# Patient Record
Sex: Female | Born: 1945 | Race: White | Hispanic: No | Marital: Married | Smoking: Never smoker
Health system: Southern US, Community
[De-identification: ages and names within clinical notes are randomized; demographics above are authoritative.]

## PROBLEM LIST (undated history)

## (undated) DIAGNOSIS — M1990 Unspecified osteoarthritis, unspecified site: Secondary | ICD-10-CM

## (undated) DIAGNOSIS — R112 Nausea with vomiting, unspecified: Secondary | ICD-10-CM

## (undated) DIAGNOSIS — C50919 Malignant neoplasm of unspecified site of unspecified female breast: Secondary | ICD-10-CM

## (undated) DIAGNOSIS — C801 Malignant (primary) neoplasm, unspecified: Secondary | ICD-10-CM

## (undated) DIAGNOSIS — R918 Other nonspecific abnormal finding of lung field: Secondary | ICD-10-CM

## (undated) DIAGNOSIS — Z9889 Other specified postprocedural states: Secondary | ICD-10-CM

## (undated) DIAGNOSIS — C541 Malignant neoplasm of endometrium: Secondary | ICD-10-CM

## (undated) DIAGNOSIS — J189 Pneumonia, unspecified organism: Secondary | ICD-10-CM

## (undated) HISTORY — DX: Malignant neoplasm of endometrium: C54.1

## (undated) HISTORY — DX: Malignant neoplasm of unspecified site of unspecified female breast: C50.919

## (undated) HISTORY — PX: ABDOMINAL HYSTERECTOMY: SHX81

---

## 1980-01-06 DIAGNOSIS — R112 Nausea with vomiting, unspecified: Secondary | ICD-10-CM

## 1980-01-06 DIAGNOSIS — Z9889 Other specified postprocedural states: Secondary | ICD-10-CM

## 1980-01-06 HISTORY — DX: Nausea with vomiting, unspecified: Z98.890

## 1980-01-06 HISTORY — DX: Nausea with vomiting, unspecified: R11.2

## 1980-04-05 HISTORY — PX: TUBAL LIGATION: SHX77

## 1980-09-05 DIAGNOSIS — C50919 Malignant neoplasm of unspecified site of unspecified female breast: Secondary | ICD-10-CM

## 1980-09-05 HISTORY — PX: MASTECTOMY: SHX3

## 1980-09-05 HISTORY — DX: Malignant neoplasm of unspecified site of unspecified female breast: C50.919

## 1980-09-11 HISTORY — PX: BREAST BIOPSY: SHX20

## 1997-02-26 ENCOUNTER — Other Ambulatory Visit: Admission: RE | Admit: 1997-02-26 | Discharge: 1997-02-26 | Payer: Self-pay | Admitting: Obstetrics and Gynecology

## 1998-03-04 ENCOUNTER — Other Ambulatory Visit: Admission: RE | Admit: 1998-03-04 | Discharge: 1998-03-04 | Payer: Self-pay | Admitting: Obstetrics and Gynecology

## 1999-03-24 ENCOUNTER — Other Ambulatory Visit: Admission: RE | Admit: 1999-03-24 | Discharge: 1999-03-24 | Payer: Self-pay | Admitting: Obstetrics and Gynecology

## 2000-02-05 ENCOUNTER — Encounter: Admission: RE | Admit: 2000-02-05 | Discharge: 2000-02-05 | Payer: Self-pay | Admitting: Rheumatology

## 2000-03-25 ENCOUNTER — Encounter: Admission: RE | Admit: 2000-03-25 | Discharge: 2000-03-25 | Payer: Self-pay | Admitting: Rheumatology

## 2000-04-12 ENCOUNTER — Other Ambulatory Visit: Admission: RE | Admit: 2000-04-12 | Discharge: 2000-04-12 | Payer: Self-pay | Admitting: Obstetrics and Gynecology

## 2000-05-20 ENCOUNTER — Encounter (HOSPITAL_COMMUNITY): Admission: RE | Admit: 2000-05-20 | Discharge: 2000-06-19 | Payer: Self-pay | Admitting: Rheumatology

## 2000-08-05 ENCOUNTER — Encounter (HOSPITAL_COMMUNITY): Admission: RE | Admit: 2000-08-05 | Discharge: 2000-09-04 | Payer: Self-pay | Admitting: Rheumatology

## 2000-09-30 ENCOUNTER — Encounter (HOSPITAL_COMMUNITY): Admission: RE | Admit: 2000-09-30 | Discharge: 2000-10-30 | Payer: Self-pay | Admitting: Oncology

## 2000-12-23 ENCOUNTER — Encounter (HOSPITAL_COMMUNITY): Admission: RE | Admit: 2000-12-23 | Discharge: 2001-01-22 | Payer: Self-pay | Admitting: Rheumatology

## 2001-03-17 ENCOUNTER — Encounter (HOSPITAL_COMMUNITY): Admission: RE | Admit: 2001-03-17 | Discharge: 2001-04-16 | Payer: Self-pay | Admitting: Rheumatology

## 2001-03-17 ENCOUNTER — Encounter: Payer: Self-pay | Admitting: Rheumatology

## 2001-03-31 ENCOUNTER — Encounter: Payer: Self-pay | Admitting: Rheumatology

## 2001-04-21 ENCOUNTER — Other Ambulatory Visit: Admission: RE | Admit: 2001-04-21 | Discharge: 2001-04-21 | Payer: Self-pay | Admitting: Obstetrics and Gynecology

## 2001-05-12 ENCOUNTER — Encounter (HOSPITAL_COMMUNITY): Admission: RE | Admit: 2001-05-12 | Discharge: 2001-06-11 | Payer: Self-pay | Admitting: Rheumatology

## 2001-07-28 ENCOUNTER — Encounter: Payer: Self-pay | Admitting: Rheumatology

## 2001-07-28 ENCOUNTER — Encounter (HOSPITAL_COMMUNITY): Admission: RE | Admit: 2001-07-28 | Discharge: 2001-08-27 | Payer: Self-pay | Admitting: Oncology

## 2001-09-29 ENCOUNTER — Encounter (HOSPITAL_COMMUNITY): Admission: RE | Admit: 2001-09-29 | Discharge: 2001-10-29 | Payer: Self-pay | Admitting: Rheumatology

## 2001-12-08 ENCOUNTER — Encounter (HOSPITAL_COMMUNITY): Admission: RE | Admit: 2001-12-08 | Discharge: 2002-01-07 | Payer: Self-pay | Admitting: Rheumatology

## 2002-03-02 ENCOUNTER — Encounter (HOSPITAL_COMMUNITY): Admission: RE | Admit: 2002-03-02 | Discharge: 2002-04-01 | Payer: Self-pay | Admitting: Rheumatology

## 2002-04-26 ENCOUNTER — Other Ambulatory Visit: Admission: RE | Admit: 2002-04-26 | Discharge: 2002-04-26 | Payer: Self-pay | Admitting: Obstetrics and Gynecology

## 2002-05-25 ENCOUNTER — Encounter (HOSPITAL_COMMUNITY): Admission: RE | Admit: 2002-05-25 | Discharge: 2002-06-24 | Payer: Self-pay | Admitting: Rheumatology

## 2002-06-29 ENCOUNTER — Encounter (HOSPITAL_COMMUNITY): Admission: RE | Admit: 2002-06-29 | Discharge: 2002-07-29 | Payer: Self-pay | Admitting: Rheumatology

## 2002-08-18 ENCOUNTER — Encounter (HOSPITAL_COMMUNITY): Admission: RE | Admit: 2002-08-18 | Discharge: 2002-09-17 | Payer: Self-pay | Admitting: Rheumatology

## 2002-10-19 ENCOUNTER — Encounter (HOSPITAL_COMMUNITY): Admission: RE | Admit: 2002-10-19 | Discharge: 2002-11-18 | Payer: Self-pay | Admitting: Rheumatology

## 2003-05-29 ENCOUNTER — Other Ambulatory Visit: Admission: RE | Admit: 2003-05-29 | Discharge: 2003-05-29 | Payer: Self-pay | Admitting: Obstetrics and Gynecology

## 2004-01-17 ENCOUNTER — Ambulatory Visit (HOSPITAL_COMMUNITY): Admission: RE | Admit: 2004-01-17 | Discharge: 2004-01-17 | Payer: Self-pay | Admitting: Family Medicine

## 2004-01-22 ENCOUNTER — Ambulatory Visit (HOSPITAL_COMMUNITY): Admission: RE | Admit: 2004-01-22 | Discharge: 2004-01-22 | Payer: Self-pay | Admitting: Family Medicine

## 2004-01-29 ENCOUNTER — Ambulatory Visit (HOSPITAL_COMMUNITY): Admission: RE | Admit: 2004-01-29 | Discharge: 2004-01-29 | Payer: Self-pay | Admitting: Family Medicine

## 2004-07-11 ENCOUNTER — Ambulatory Visit: Payer: Self-pay | Admitting: Cardiology

## 2008-11-07 ENCOUNTER — Encounter: Payer: Self-pay | Admitting: Internal Medicine

## 2008-12-05 ENCOUNTER — Ambulatory Visit (HOSPITAL_COMMUNITY): Admission: RE | Admit: 2008-12-05 | Discharge: 2008-12-05 | Payer: Self-pay | Admitting: Internal Medicine

## 2008-12-05 ENCOUNTER — Ambulatory Visit: Payer: Self-pay | Admitting: Internal Medicine

## 2008-12-13 ENCOUNTER — Encounter: Payer: Self-pay | Admitting: Internal Medicine

## 2008-12-13 ENCOUNTER — Telehealth (INDEPENDENT_AMBULATORY_CARE_PROVIDER_SITE_OTHER): Payer: Self-pay

## 2008-12-14 ENCOUNTER — Encounter: Payer: Self-pay | Admitting: Internal Medicine

## 2010-05-23 NOTE — Consult Note (Signed)
NAME:  Alejandra Callahan, FARFAN NO.:  [id number]   MEDICAL RECORD NO.:  [id number]                  PATIENT TYPE:   LOCATION:                                       FACILITY:   PHYSICIAN:  Aundra Dubin, M.D.            DATE OF BIRTH:   DATE OF CONSULTATION:  06/29/2002  DATE OF DISCHARGE:                                   CONSULTATION   CHIEF COMPLAINT:  Rheumatoid arthritis.   This patient returns reporting that she is doing well.  She occasionally has  some achiness in the PIPs as she did last week. This is now better. She has  had no significant flares lasting 1 or 2 weeks. She has had no URIs, cough,  fever, shortness of breath, nausea, or stomatitis.  Her weight is stable.  She is, overall, feeling well.   She tells me that her most recent Dexascan shows an improvement.  She is now  in an osteopenia category.  She is not having back pain.   MEDICINES:  1. Humira 40 mg about every 3 weeks.  2. Methotrexate 20 mg every week.  3. Caltrate with vitamin D b.i.d.  4. Evista 60 mg daily.  5. Fosamax 70 mg every week.  6. Darvocet rare.  7. Relafen 500 mg daily.   PHYSICAL EXAMINATION:  VITAL SIGNS:  Weight 114 pounds, blood pressure  100/60, respiratory rate 16.  GENERAL:  She appears well.  SKIN:  No ecchymosis, clear.  NECK:  Negative JVD.  No adenopathy.  LUNGS:  Clear.  HEART:  Regular with no murmur.  ABDOMEN:  Negative HSM, nontender.  MUSCULOSKELETAL:  The hands show continued mild synovitis to the PIPs and  MCPs. There is slight bogginess to the second and third right TIPs.  The  wrists have mild chronic swelling, but are nontender.  Elbows, shoulders and  neck have a good range of motion.  Back nontender.  The knees have mild  crepitation but flex, without tenderness to 130 degrees.  The ankles and  feet were nonswollen and nontender.   ASSESSMENT/PLAN:  1. Rheumatoid arthritis.  She remains doing well using the methotrexate and    Humira.  I believe that this is fine that she uses it every 3 weeks. She     is quite stable.  She is going on a trip later this summer and might use     prednisone during this time.  Labs checked on May 25, 2002 show a WBC     5.0, hemoglobin 12.9, platelets 239, AST 34,     albumin 3.7, creatinine 0.5.  We will check labs, again, in mid-August.  2. Osteopenia.  3. She will return in 4 months.  Aundra Dubin, M.D.    WWT/MEDQ  D:  06/29/2002  T:  06/29/2002  Job:  [id number]   cc:   Angus G. Renard Matter, M.D.  [address]  [place]  [place] [address]  Fax: [phone]

## 2010-05-23 NOTE — Consult Note (Signed)
NAME:  Alejandra Callahan, LUKIN NO.:  [id number]   MEDICAL RECORD NO.:  [id number]                   PATIENT TYPE:   LOCATION:                                       FACILITY:   PHYSICIAN:  Aundra Dubin, M.D.            DATE OF BIRTH:   DATE OF CONSULTATION:  03/02/2002  DATE OF DISCHARGE:                                   CONSULTATION   CHIEF COMPLAINT:  Rheumatoid arthritis.   HISTORY OF PRESENT ILLNESS:  Ms. Alejandra Callahan returns reporting that she overall is  doing well.  She will take the Humira about every three weeks and sometimes  every second week.  She still has some injection site reactions which seem  to be lessened but still present.  She has had no URI's, fever, cough,  shortness of breath, stomatitis, rashes or headaches.  Her weight is down  two pounds.  She generally is feeling well and her energy level is good.  She still has mild to moderate achiness in the hands, wrists and feet,  particularly, her stiffness in the morning is about 20 to 30 minutes.   CURRENT MEDICATIONS:  Relafen 500 mg daily, Humira 40 mg q.o. Tuesday,  methotrexate 20 mg q Friday, Caltrate with vitamin D b.i.d., Evista 60 mg  daily, Fosamax 70 mg q week, folic acid 2 mg daily, Darvocet p.r.n.   PHYSICAL EXAMINATION:  GENERAL:  She appears well.  Weight 113 pounds.  VITAL SIGNS:  Blood pressure 100/60, respirations 16.  SKIN:  No bruises.  She has a slight red area from her last Humira injection  at the abdomen.  LUNGS:  Clear.  HEART:  Regular, no murmur.  NECK:  Negative JVD.  MUSCULOSKELETAL:  She continues to have mild to moderate synovitis of the  PIP's, MCP's and wrists.  These areas have mild tenderness, but overall are  much improved from 18 months ago.  They otherwise, have a good range of  motion.  The shoulders are mildly stiff, but are comfortable.  Back  nontender.  Hips, knees, good range of motion.  The knees have crepitation.  Ankles were nontender and  the NTT's had mild tenderness.   ASSESSMENT AND PLAN:  1. Rheumatoid arthritis.  She overall is stable and is doing well.  Had     suggested that she take the Humira on a q.2 week dose and then after the     injection, apply Benadryl cream to the area immediately to hopefully     prevent the ISR.  We will check labs at this time.  Labs from 12/08/01     showed an albumin 3.6, WBC 5.1, Hgb 12.9, CLP     242, creatinine 0.5, AST 26.  Will also repeat labs again in three     months.  2. Osteoporosis.   She will return in four months.  Aundra Dubin, M.D.    WWT/MEDQ  D:  03/02/2002  T:  03/02/2002  Job:  [id number]   cc:   Angus G. Renard Matter, M.D.  [address]  [place]  [place] [address]  Fax: [phone]

## 2010-05-23 NOTE — Consult Note (Signed)
[facility]  Patient:    Alejandra Callahan, Alejandra Callahan Visit Number: [id number] MRN: [id number]          Service Type: RHE Location: [facility] Attending Physician:  Aundra Dubin Dictated by:   Nathaneil Canary, M.D. Proc. Date: 05/12/01 Admit Date:  05/12/2001   CC:         Butch Penny, M.D.   Consultation Report  CHIEF COMPLAINT:  Rheumatoid arthritis.  BRIEF HISTORY:  This is the first office visit for Alejandra Callahan after starting Humira.  She is feeling considerably better.  She has noticed significant decreased swelling and pain in her hands and wrists particularly her feet still ache some.  She has been able to take herself down and is off of prednisone at this time.  There has been no injection site reactions.  There has been no rashes, fever, cough, nausea, stomatitis.  MEDICATIONS: 1. Humira 40 mg q.o. week. 2. Methotrexate 20 mg q. week. 3. Folic acid 2 mg q. day. 4. Relafen 500 mg q. day. 5. Caltrate with Vitamin D b.i.d. 6. Evista 60 mg q. day.  PHYSICAL EXAMINATION:  VITAL SIGNS:  Weight 112 pounds.  Blood pressure 100/70, respirations 16.  GENERAL:  No distress.  SKIN:  Clear.  LUNGS:  Clear.  HEART:  Regular no murmur.  MUSCULOSKELETAL:  There has been a reduction in the swelling to the PIPs and MCPs and a significant reduction in the tenderness.  Wrists, elbows, shoulders, knees and ankles have a good range of motion and they are all nontender.  She still remains moderately tender with compression across the MTPs.  ASSESSMENT: 1. Rheumatoid arthritis.  I believe she is clearly improved on the q.o. week    humira injection and this will be continued. She will continue with the    methotrexate 20 mg q. week and folic acid 2 mg q. day.  She is presently    off of prednisone but may need this occasionally.  We will check labs    today. 2. Osteoporosis issues.  She will continue with the Evista and calcium. 3. Vague chest CT finding.  She  had a CT scan on March 17, 2001, for follow up    of a chest x-ray finding.  This still shows a vague triangular opacity in    the left lung.  We will need to repeat the CT scan in June.  Clinically she    is doing well and this is probably a low suspicion for anything serious.  FOLLOWUP:  She will return in 3 months. Dictated by:   Nathaneil Canary, M.D. Attending Physician:  Aundra Dubin DD:  05/12/01 TD:  05/14/01 Job: [id number]

## 2010-05-23 NOTE — Consult Note (Signed)
[facility]  Patient:    Alejandra Callahan, Alejandra Callahan Visit Number: [id number] MRN: [id number]          Service Type: RHE Location: [facility] Attending Physician:  Aundra Dubin Dictated by:   Nathaneil Canary, M.D. Proc. Date: 07/28/01 Admit Date:  07/28/2001   CC:         Butch Penny, M.D.   Consultation Report  CHIEF COMPLAINT:  Rheumatoid arthritis.  HISTORY OF PRESENT ILLNESS:  The patient has had her followup chest CT because of a vague nodule seen on a chest x-ray in March.  The preliminary report indicates no nodules or masses.  There was a dilated esophagus and a question whether she may have had a distal esophageal stricture.  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  Her weight is stable.  She has been under stress as she has lost her mother in early June.  She feels that the arthritis is doing overall quite well.  She still has chronic swelling to several fingers but is in much less pain.  The stiffness in the morning lasts 15-20 minutes.  She has had no fever, rashes, URIs, cough, shortness of breath, stomatitis.  She has had injection site reactions.  We have discussed over the telephone that she would use ice and Benadryl which does help some.  MEDICINES: 1. Humira 40 mg every other week. 2. Methotrexate 20 mg daily. 3. Folic acid 2 mg daily. 4. Calcium with vitamin D b.i.d. 5. Evista 60 mg daily. 6. Fosamax 70 mg every week. 7. Rare prednisone.  PHYSICAL EXAMINATION:  VITAL SIGNS:  Weight 111 pounds, blood pressure 100/64, respirations 16.  GENERAL:  No distress.  SKIN:  She has some slight macular redness to the lower abdomen where she gives herself an injection.  LUNGS:  Clear.  NECK:  Negative JVD.  HEART:  Regular.  LOWER EXTREMITIES:  No edema.  MUSCULOSKELETAL:  The right third PIP remains swollen and boggy.  She has chronic swelling and mild tenderness to the bilateral MCPs.  The wrists have chronic swelling but are  cool and nontender.  Elbows and shoulders good range of motion.  Back nontender.  Knees are cool and flex easily to 130 degrees. The ankles and feet have mild tenderness.  ASSESSMENT AND PLAN: 1. Rheumatoid arthritis.  She remains stable and is improved since the introduction of the Humira which we will continue.  With the stress she is    under, I believe this is the wrong time to try and lower the methotrexate.    She will continue on 20 mg every week.  Laboratories checked on May 12, 2001    showed a WBC 5.7, HGB 13.3, PLT 247, creatinine 0.9, albumin is 3.9, AST    25.  We will check laboratories today. 2. Resolution of chest nodule. 3. Hepatic cyst per recent CT scan.  I await the final report to see if the    radiologist recommends followup studies or an ultrasound. 4. Esophageal dilatation.  I discussed with the patient these findings.  She    reports that several years ago she had a great deal of difficulty    swallowing with food being caught in the esophagus.  This has been an    unchanged symptom.  At the present time we will not send her for followup    studies as she is stable but if this symptom changed then of course we    would obtain endoscopy.  She will return in 3 months. Dictated by:   Nathaneil Canary, M.D. Attending Physician:  Aundra Dubin DD:  07/28/01 TD:  08/01/01 Job: [id number]

## 2010-05-23 NOTE — Consult Note (Signed)
NAME:  Alejandra Callahan, LAUGHNER NO.:  [id number]   MEDICAL RECORD NO.:  [id number]                   PATIENT TYPE:   LOCATION:                                       FACILITY:   PHYSICIAN:  Aundra Dubin, M.D.            DATE OF BIRTH:   DATE OF CONSULTATION:  12/08/2001  DATE OF DISCHARGE:                                   CONSULTATION   CHIEF COMPLAINT:  Rheumatoid arthritis.   HISTORY OF PRESENT ILLNESS:  The patient is reporting she has had less of  the injection site reactions.  They have not completely stopped but they are  less severe than they were.  She reports that she is having no problems  with her joints and they are not hurting.  I believe she is overall  significantly more comfortable with less arthritic pain.  She is stiff in  the mornings for about 20 minutes.  There have been no URI's, fever, cough,  nausea, stomatitis, headaches, rashes, or reported hair thinning.  Her  weight is stable.   MEDICATIONS:  1. Methotrexate 20 mg weekly.  2. Relafen 500 mg q.d.  3. Humira 40 mg q.o. week.  4. Caltrate with vitamin D b.i.d.  5. Evista 60 mg q.d.  6. Folic acid 2 mg q.d.  7. Fosamax 70 mg weekly.  8. Darvocet rare.   PHYSICAL EXAMINATION:  VITAL SIGNS:  Weight 115 pounds, blood pressure  102/58, respirations 16.  GENERAL:  She appears well.  LUNGS:  Clear.  NECK:  Negative JVD.  HEART:  Regular.  No murmur.  SKIN:  She has a slight injection reaction to the right abdominal area that  is mild.  MUSCULOSKELETAL:  The right third PIP remains chronically swollen with some  tenderness.  She is mildly tender across the MCP's and they are still  slightly swollen.  Wrists have some mild swelling and slight tenderness.  Elbows extend fully and have no nodules.  Shoulders good range of motion.  Back nontender.  Hips, knees, ankles, and feet have a good range of motion  and she has mild to moderate MTP tenderness.    ASSESSMENT AND PLAN:   Rheumatoid arthritis.  She is improved.  Unfortunately, the injection site reactions have reduced in their intensity.  We will check labs at this time.  Labs were stable at the end of September.  She will continue with the arthritis medicines as listed in the medicine  list.   She will return in three months.                                               Aundra Dubin, M.D.    WWT/MEDQ  D:  12/08/2001  T:  12/08/2001  Job:  [id number]  cc:   Angus G. Renard Matter, M.D.  [address]  [place]  [place] [address]  Fax: [phone]

## 2010-05-23 NOTE — Consult Note (Signed)
[facility]  Patient:    Alejandra Callahan, Alejandra Callahan Visit Number: [id number] MRN: [id number]          Service Type: RHE Location: [facility] Attending Physician:  Aundra Dubin Dictated by:   Nathaneil Canary, M.D. Proc. Date: 12/23/00 Admit Date:  12/23/2000   CC:         Butch Penny, M.D.   Consultation Report  CHIEF COMPLAINT:  Rheumatoid arthritis.  BRIEF HISTORY:  Ms. Nishida reports that at the end of October she decreased the prednisone to 5 mg q.o.d.  After about 3 weeks she had worsened and the left third PIP was swollen.  She is aching in both hands.  The Plaquenil so far has not greatly helped.  Her weight is stable.  There have been no URIs, fever, cough, nausea, stomatitis or back pain.  MEDICATIONS: 1. Sulfasalazine 1 gram b.i.d. 2. Folic acid 1 mg q.d. 3. Methotrexate 20 mg q. week. 4. Prednisone 5 mg q.d. 5. Plaquenil 400 mg q.d. 6. Relafen 500 mg q.d. 7. Evista 60 mg q.d. 8. Calcium with Vitamin D b.i.d.  PHYSICAL EXAMINATION:  VITAL SIGNS:  Weight 113 pounds.  Blood pressure 90/70, respirations 20.  GENERAL:  She is in no distress.  SKIN: Clear.  LUNGS:  Clear.  HEART:  Regular.  No murmur.  LOWER EXTREMITIES:  No edema.  MUSCULOSKELETAL:  The bilateral third PIPs are swollen and tender.  The MCPs are moderately swollen and tender.  The wrists have swelling with tenderness. Elbows and shoulders have a good range of motion with mild shoulder stiffness.  The knees, ankles and feet are nonswollen and nontender.  ASSESSMENT AND PLAN: 1. Rheumatoid arthritis. She continues with active arthritis.  We will    continue the medicines as above.  We are giving her an injection of 120 mg    of Depo-Medrol today.  If the triple combination medications has not    worked, if she returns satisfactorily, then we will be making further    changes.  We will check labs today.  FOLLOWUP:  She will return in 3 months or before p.r.n. Dictated by:    Nathaneil Canary, M.D. Attending Physician:  Aundra Dubin DD:  12/23/00 TD:  12/23/00 Job: [id number]

## 2010-05-23 NOTE — Consult Note (Signed)
   NAME:  Alejandra Callahan, Alejandra Callahan NO.:  [id number]   MEDICAL RECORD NO.:  [id number]                   PATIENT TYPE:   LOCATION:                                       FACILITY:   PHYSICIAN:  Aundra Dubin, M.D.            DATE OF BIRTH:   DATE OF CONSULTATION:  10/19/2002  DATE OF DISCHARGE:                                   CONSULTATION   CHIEF COMPLAINT:  Rheumatoid arthritis.   HISTORY OF PRESENT ILLNESS:  [name] reports that she is feeling quite  well.  She has had no bad flares.  She used a small amount of prednisone  while on a trip to [place].  Her weight is stable and she is up four pounds.  There has been no URI, fever, cough, shortness of breath, nausea, stomatitis  or back pain.   MEDICATIONS:  1. Humira about every three weeks.  2. Methotrexate 20 mg q.week.  3. Evista 60 mg daily.  4. Fosamax 70 mg q.week.  5. Relafen 500 mg daily.  6. Calcium with vitamin D b.i.d.   PHYSICAL EXAMINATION:  VITAL SIGNS:  Weight 118 pounds, blood pressure  110/60, respirations 14.  GENERAL APPEARANCE:  No distress.  SKIN:  Clear.  LUNGS:  Clear.  HEART:  Regular, no murmur.  EXTREMITIES:  Lower extremities, no edema.  MUSCULOSKELETAL:  She shows only minor fullness to the MCP's and wrists at  this time, and they are cool and nontender.  Elbows extend fully.  Shoulders  have good range of motion, and there is no tenderness.  Back is nontender.  Knees, ankles and feet are cool, nonswollen and nontender.    ASSESSMENT/PLAN:  Rheumatoid arthritis.  She remains quite stable and will  continue on the medications as above.  Labs checked on August 18, 2002,  showed a WBC of 7.2, HGB 13.6, PLT 248, albumin 4.0, AST 26, creatinine 0.6.  She will have repeat labs in mid November, hopefully, through Dr. Renard Matter'  office.   I will see her back in my [place] office in mid February.      ___________________________________________                       Aundra Dubin, M.D.   WWT/MEDQ  D:  10/19/2002  T:  10/19/2002  Job:  [id number]   cc:   Angus G. Renard Matter, M.D.  [address]  [place]  [place] [address]  Fax: [phone]

## 2010-05-23 NOTE — H&P (Signed)
[facility]  Patient:    Alejandra Callahan, Alejandra Callahan Visit Number: [id number] MRN: [id number]          Service Type: RHE Location: [facility] Attending Physician:  Aundra Dubin Dictated by:   Nathaneil Canary, M.D. Admit Date:  03/17/2001   CC:         Butch Penny, M.D.   History and Physical  CHIEF COMPLAINT:  Rheumatoid arthritis.  BRIEF HISTORY:  Ms. Gatt returns reporting that she has had a rough couple of months.  The injection given in December helped but this wore off and she has been aching in her hands, wrist and feet for 6 to 8weeks.  She has been on the triple antibiotic combination for 6 months now.  She is stiff in the mornings for at least an hour.  Her weight is stable.  There has been no polyuria, polydipsia, fever, cough, nausea, or stomatitis.  MEDICATIONS: 1. Methotrexate 20 mg q. week. 2. Plaquenil 400 mg q. day. 3. Sulfasalazine 1 gram b.i.d. 4. Evista 60 mg q. day. 5. Folic acid 2 mg q. day. 6. Relafen 500 mg q. day. 7. Caltrate with Vitamin D b.i.d. 8. Prednisone 5 mg q. day.  PHYSICAL EXAMINATION:  VITAL SIGNS:  Weight 114 pounds, blood pressure 90/58, respirations 16.  SKIN:  Clear.  LUNGS:  Clear.  HEART:  Regular no murmur.  EXTREMITIES:  Lower extremities no edema.  MUSCULOSKELETAL:  She remains moderately swollen over the PIPs, MCPs and wrist.  These areas are mildly warm but moderately tender.  Elbows extend fully and are nontender.  Shoulders move with stiffness but are nontender. Back:  Nontender.  Hips and knees:  Good range of motion without pain.  Ankles are nontender.  She is moderately tender with compression across the MTPs.  ASSESSMENT AND PLAN: 1. RHEUMATOID ARTHRITIS.  She continues to show significant activity even with    the triple therapy as mentioned above.  I do not believe that it is    adequately working and we will stop the sulfasalazine and the Plaquenil at    this time.  She will continue with  methotrexate 20 mg q. week, and    prednisone 5 mg q. day.  I am starting her on Humira.  This is a 40 mg self administered injection every other week.  This is the newest biological medicine used for rheumatoid arthritis that has recently been approved by the FDA.  I have discussed potential risks and side effects with her.  As a class of biologic agents there seems to be a very minimal, but slight risk, of lymphoma.  This has not been recognized until a report in [facility] in January 2003.  There was a mention of about 30 cases of lymphoma with ______ and Remicade.  I would have to generalize that she will be at slight risk of lymphoma although this is not reported and is not in the PDR insert.  She will be at slight risk of having unusual infections such as TB, fungal, PCP, and others.  Ms. Wahlquist has had a reaction to a PPD in the past and was told never to have this applied again.  We will check a chest x-ray at this time.  I believe the incidence of unusual infections is very low.  There is also a chance of injection site reactions.  She will obtain the medicine and return to undergo some instructions by the nurses on giving self injections.  If this  will work, I believe she will feel considerably better over the next 4 to 6 weeks.  We will check usual labs today and I will see her back in about 2 months. Dictated by:   Nathaneil Canary, M.D. Attending Physician:  Aundra Dubin DD:  03/17/01 TD:  03/18/01 Job: [id number]

## 2010-05-23 NOTE — Consult Note (Signed)
   NAME:  Alejandra Callahan, Alejandra Callahan NO.:  [id number]   MEDICAL RECORD NO.:  [id number]                   PATIENT TYPE:   LOCATION:                                       FACILITY:   PHYSICIAN:  Aundra Dubin, M.D.            DATE OF BIRTH:   DATE OF CONSULTATION:  10/12/2001  DATE OF DISCHARGE:                                   CONSULTATION   CHIEF COMPLAINT:  Rheumatoid arthritis.   HISTORY OF PRESENT ILLNESS:  The patient is returning for an earlier  appointment, primarily out of scheduling changes and mix-ups.  She has had  some mild injection-type reaction the one time that she has taken it since I  had seen her on September 29, 2001.  This is to the stomach area, and she  used a little Benadryl.  She feels the arthritis is stable.  She has been  aching some in her shoulders.   MEDICATIONS:  1. Humira 40 mg every other week.  2. Methotrexate 20 mg every week.  3. Other medicines are unchanged from September 29, 2001.   PHYSICAL EXAMINATION:  VITAL SIGNS:  Weight 115 pounds.  Blood pressure  94/60, respirations 18.  EXTREMITIES:  Her hands and wrists show a slight degree less swelling than  they did two weeks ago.  The skin shows a mild reaction to the right side of  the abdomen.  The shoulders are stiff, but she can abduct the left shoulder  to 130 degrees.  The back was nontender.  The knees, ankles, feet had slight  tenderness.   ASSESSMENT AND PLAN:  Rheumatoid arthritis:  She is here early again because  of a scheduling mix-up.  She is stable, and I have encouraged her to  continue using the Humira.  Laboratories checked on September 29, 2001, were  stable.  Her AST was 26, albumin 3.6, and she had a normal CBC.   She will return in two months.                                               Aundra Dubin, M.D.    WWT/MEDQ  D:  10/13/2001  T:  10/13/2001  Job:  [id number]   cc:   Angus G. Renard Matter, M.D.

## 2010-05-23 NOTE — Consult Note (Signed)
NAME:  Alejandra Callahan, Alejandra Callahan NO.:  [id number]   MEDICAL RECORD NO.:  [id number]                   PATIENT TYPE:   LOCATION:                                       FACILITY:   PHYSICIAN:  Aundra Dubin, M.D.            DATE OF BIRTH:   DATE OF CONSULTATION:  09/29/2001  DATE OF DISCHARGE:                                   CONSULTATION   CHIEF COMPLAINT:  Rheumatoid arthritis.   HISTORY OF PRESENT ILLNESS:  I had spoken with the patient three times by  telephone over the month of August to early September.  She has been having  injection site reactions.  These have occurred to her stomach, which were  mild.  She had a more pronounced reaction with some mild discomfort but more  heat and induration to one of her thighs.  Finally, because of the frequency  of calls and what I perceived to be significant injection site reactions, I  had her stop the medicine on September 08, 2001.  As I had discussed with her  today in a fuller manner, I can tell that these were not over severe.  She  did find that her arthritis was significantly better; however, she has had a  day of hurting in the right shoulder two weeks ago which resolved and her  finger and wrist joints have been starting to swell some.  She is overall  fairly comfortable at this time.  There has been no URI's, fever, cough,  nausea, stomatitis, shortness of breath, or rashes.   MEDICATIONS:  1. Caltrate and vitamin D b.i.d.  2. Tylenol p.r.n.  3. Methotrexate 20 mg weekly.  4. Evista 60 mg q.d.  5. Folic acid 1 mg q.d.  6. Relafen 500 mg q.d.  7. Fosamax 70 mg weekly.   PHYSICAL EXAMINATION:  VITAL SIGNS:  Weight 115 pounds, blood pressure  90/50, respirations 18.  GENERAL:  She appears well.  SKIN:  I can see some slight trace of the reactions to the thighs.  LUNGS:  Clear.  NECK:  Negative JVD.  HEART:  Regular.  No murmur.  EXTREMITIES:  Lower extremities:  No edema.  MUSCULOSKELETAL:   She does have emerging swelling to several PIP's, which  are soft and boggy, especially the right fourth.  The MCP's are swollen and  mild to moderately tender.  The wrists have some mild warmth and tenderness.  Elbows and shoulders:  Good range of motion.  Back:  Nontender.  Hips,  knees, ankles, and feet have a good range of motion and the MTP's are  tender.   ASSESSMENT AND PLAN:  Rheumatoid arthritis.  This is taking another fairly  significant length of time to discuss with her.  Although the injection site  reactions are quite dramatic to her, I believe in the long run that they  will die down and are more of a  mild nature.  There has been some mild  discomfort but she is not having significant pain.  She has found using some  Benadryl cream, along with icing these areas, has helped.  We have also  discussed using possible Enbrel but this is a twice a week injection with  some mixing to be done prior to the injection.  My sense is that over time  these injection site reactions will decrease in their severity.  I am hoping  to go at least two months to give it a further trial.  She is willing to do  this.  If this is not working, then we will give a washout period of time  and try Enbrel.  She will continue with the methotrexate at 20 mg a week.  We will check usual labs for this.   I will see her back in two months.   TIME:  Twenty-five minutes for today's visit, greater than 20 minutes for  other phone calls.                                                Aundra Dubin, M.D.    WWT/MEDQ  D:  09/29/2001  T:  09/30/2001  Job:  [id number]   cc:   Angus G. Renard Matter, M.D.

## 2011-12-10 ENCOUNTER — Encounter (HOSPITAL_COMMUNITY): Payer: Self-pay | Admitting: *Deleted

## 2011-12-10 ENCOUNTER — Ambulatory Visit (HOSPITAL_COMMUNITY)
Admit: 2011-12-10 | Discharge: 2011-12-10 | Disposition: A | Discharge status: Home / Self Care | Payer: Medicare Other | Attending: Internal Medicine | Admitting: Internal Medicine

## 2011-12-10 ENCOUNTER — Emergency Department (HOSPITAL_COMMUNITY)
Admission: EM | Admit: 2011-12-10 | Discharge: 2011-12-10 | Disposition: A | Discharge status: Home / Self Care | Payer: Medicare Other | Attending: Emergency Medicine | Admitting: Emergency Medicine

## 2011-12-10 DIAGNOSIS — M79609 Pain in unspecified limb: Secondary | ICD-10-CM | POA: Insufficient documentation

## 2011-12-10 DIAGNOSIS — M79604 Pain in right leg: Secondary | ICD-10-CM

## 2011-12-10 DIAGNOSIS — Z853 Personal history of malignant neoplasm of breast: Secondary | ICD-10-CM | POA: Insufficient documentation

## 2011-12-10 DIAGNOSIS — Z79899 Other long term (current) drug therapy: Secondary | ICD-10-CM | POA: Insufficient documentation

## 2011-12-10 DIAGNOSIS — Z8739 Personal history of other diseases of the musculoskeletal system and connective tissue: Secondary | ICD-10-CM | POA: Insufficient documentation

## 2011-12-10 DIAGNOSIS — M25559 Pain in unspecified hip: Secondary | ICD-10-CM | POA: Insufficient documentation

## 2011-12-10 DIAGNOSIS — M79651 Pain in right thigh: Secondary | ICD-10-CM

## 2011-12-10 HISTORY — DX: Malignant (primary) neoplasm, unspecified: C80.1

## 2011-12-10 HISTORY — DX: Unspecified osteoarthritis, unspecified site: M19.90

## 2011-12-10 LAB — POCT I-STAT, CHEM 8
BUN: 16 mg/dL (ref 6–23)
Creatinine, Ser: 0.7 mg/dL (ref 0.50–1.10)
Hemoglobin: 12.9 g/dL (ref 12.0–15.0)
Potassium: 3.8 mEq/L (ref 3.5–5.1)
Sodium: 139 mEq/L (ref 135–145)
TCO2: 23 mmol/L (ref 0–100)

## 2011-12-10 MED ORDER — ENOXAPARIN SODIUM 100 MG/ML ~~LOC~~ SOLN
1.0000 mg/kg | Freq: Once | SUBCUTANEOUS | Status: AC
  Administered 2011-12-10: 50 mg via SUBCUTANEOUS
  Filled 2011-12-10: qty 1

## 2011-12-10 MED ORDER — IBUPROFEN 800 MG PO TABS
800.0000 mg | ORAL_TABLET | Freq: Once | ORAL | Status: AC
  Administered 2011-12-10: 800 mg via ORAL
  Filled 2011-12-10: qty 1

## 2011-12-10 NOTE — ED Provider Notes (Signed)
Alejandra Callahan is a 66 y.o. female who is here last night with right medial thigh pain. She was treated with Motrin, which helped her pain. Today, she is ambulatory with minimal discomfort. There is no distinct injury. There are no associated symptoms. Doppler right leg, by radiology, negative for DVT. The patient has appointment with her primary care Dr. Bea Laura to be reevaluated.  Diagnosis- nonspecific right upper leg pain  Plan- Tylenol or Motrin for pain. Follow up with PCP, as planned, and as needed.  Flint Melter, MD 12/10/11 [phone]

## 2011-12-10 NOTE — ED Provider Notes (Signed)
History     CSN: [id number]  Arrival date & time 12/10/11  0153   First MD Initiated Contact with Patient 12/10/11 0246      Chief Complaint  Patient presents with  . Leg Pain    (Consider location/radiation/quality/duration/timing/severity/associated sxs/prior treatment) HPI History provided by patient. Developed right medial thigh pain tonight. She denies any trauma. He had been playing with her grandson does not recall injuring her leg. No swelling or redness. No history of same. No fevers or chills. No shortness of breath or chest pain. Patient is worried she may have a blood clot but has no history of same. No recent surgeries. Has remote history of mastectomy secondary to breast cancer. No known active cancer. Pain is sharp in quality and hurts to move or palpate that area. She took Tylenol earlier but declines any narcotic pain medications but is requesting something for discomfort. Pain is constant. No known alleviating factors. No associated weakness or numbness. Past Medical History  Diagnosis Date  . Cancer     breast  . Arthritis     Past Surgical History  Procedure Date  . Mastectomy 09/1980    History reviewed. No pertinent family history.  History  Substance Use Topics  . Smoking status: Never Smoker   . Smokeless tobacco: Not on file  . Alcohol Use: No    OB History    Grav Para Term Preterm Abortions TAB SAB Ect Mult Living                  Review of Systems  Constitutional: Negative for fever and chills.  HENT: Negative for neck pain and neck stiffness.   Eyes: Negative for pain.  Respiratory: Negative for shortness of breath.   Cardiovascular: Negative for chest pain and leg swelling.  Gastrointestinal: Negative for abdominal pain.  Genitourinary: Negative for dysuria.  Musculoskeletal: Negative for back pain.  Skin: Negative for rash.  Neurological: Negative for headaches.  All other systems reviewed and are negative.    Allergies   Codeine  Home Medications   Current Outpatient Rx  Name  Route  Sig  Dispense  Refill  . ALENDRONATE SODIUM 10 MG PO TABS   Oral   Take 10 mg by mouth daily before breakfast. Take with a full glass of water on an empty stomach.         . OS-CAL 500 + D PO   Oral   Take 1 tablet by mouth daily.         Marland Kitchen VITAMIN D 1000 UNITS PO TABS   Oral   Take 1,000 Units by mouth daily.         Marland Kitchen FOLIC ACID 800 MCG PO TABS   Oral   Take 400 mcg by mouth daily.         Marland Kitchen ONE-DAILY MULTI VITAMINS PO TABS   Oral   Take 1 tablet by mouth daily.           BP 104/46  Pulse 77  Temp 98.2 F (36.8 C) (Oral)  Resp 18  Ht 4\' 11"  (1.499 m)  Wt 109 lb (49.442 kg)  BMI 22.02 kg/m2  SpO2 99%  Physical Exam  Constitutional: She is oriented to person, place, and time. She appears well-developed and well-nourished.  HENT:  Head: Normocephalic and atraumatic.  Eyes: EOM are normal. Pupils are equal, round, and reactive to light.  Neck: Neck supple.  Cardiovascular: Normal rate, regular rhythm and intact distal pulses.  Pulmonary/Chest: Effort normal and breath sounds normal. No respiratory distress.  Abdominal: Soft. Bowel sounds are normal. She exhibits no distension. There is no tenderness.  Musculoskeletal: Normal range of motion. She exhibits no edema.       Right medial thigh with area of tenderness and fullness. No erythema or swelling.  There is an old healing superficial wound in the area the patient states has been there for months and is unchanged. No femoral hernia. Full range of motion at the hip and knee. Distal neurovascular intact. Gait intact.  Neurological: She is alert and oriented to person, place, and time.  Skin: Skin is warm and dry.    ED Course  Procedures (including critical care time)  Results for orders placed during the hospital encounter of 12/10/11  POCT I-STAT, CHEM 8      Component Value Range   Sodium 139  135 - 145 mEq/L   Potassium 3.8  3.5  - 5.1 mEq/L   Chloride 103  96 - 112 mEq/L   BUN 16  6 - 23 mg/dL   Creatinine, Ser 1.61  0.50 - 1.10 mg/dL   Glucose, Bld 096 (*) 70 - 99 mg/dL   Calcium, Ion 0.45  4.09 - 1.30 mmol/L   TCO2 23  0 - 100 mmol/L   Hemoglobin 12.9  12.0 - 15.0 g/dL   HCT 81.1  91.4 - 78.2 %   Pain medication provided. Labs reviewed. lovenox provided and [place] scheduled for this am to further evaluate. Patient declines any narcotic pain medicines. MDM   Right thigh pain. Labs reviewed. Creatinine within normal limits. Lovenox provided. Ultrasound pending. Stable for discharge home and return today for DVT study. Has close followup as needed.         Sunnie Nielsen, MD 12/10/11 [phone]

## 2011-12-10 NOTE — ED Notes (Signed)
Pt c/o pain to upper right leg. Pt states she laid down for a nap at 1300 yesterday and woke at 1500 with pain. Pt states she does not recall injuring her leg but was very active with her grand son most of the day yesterday.

## 2012-01-06 DIAGNOSIS — C541 Malignant neoplasm of endometrium: Secondary | ICD-10-CM

## 2012-01-06 HISTORY — DX: Malignant neoplasm of endometrium: C54.1

## 2012-11-08 ENCOUNTER — Other Ambulatory Visit: Payer: Self-pay | Admitting: Obstetrics and Gynecology

## 2012-11-16 ENCOUNTER — Other Ambulatory Visit: Payer: Self-pay | Admitting: Obstetrics and Gynecology

## 2012-11-17 ENCOUNTER — Other Ambulatory Visit: Payer: Self-pay

## 2012-11-17 ENCOUNTER — Encounter (HOSPITAL_COMMUNITY)
Admission: RE | Admit: 2012-11-17 | Discharge: 2012-11-17 | Disposition: A | Discharge status: Home / Self Care | Payer: Medicare Other | Source: Ambulatory Visit | Attending: Obstetrics and Gynecology | Admitting: Obstetrics and Gynecology

## 2012-11-17 ENCOUNTER — Encounter (HOSPITAL_COMMUNITY): Payer: Self-pay | Admitting: Pharmacist

## 2012-11-17 ENCOUNTER — Encounter (HOSPITAL_COMMUNITY): Payer: Self-pay

## 2012-11-17 HISTORY — DX: Other specified postprocedural states: Z98.890

## 2012-11-17 HISTORY — DX: Nausea with vomiting, unspecified: R11.2

## 2012-11-17 LAB — CBC
Hemoglobin: 12.6 g/dL (ref 12.0–15.0)
MCH: 32.3 pg (ref 26.0–34.0)
MCV: 95.1 fL (ref 78.0–100.0)
Platelets: 242 10*3/uL (ref 150–400)
RBC: 3.9 MIL/uL (ref 3.87–5.11)
WBC: 10 10*3/uL (ref 4.0–10.5)

## 2012-11-17 NOTE — Patient Instructions (Addendum)
Your procedure is scheduled on:11/18/12  Enter through the Main Entrance at :0715 am Pick up desk phone and dial [phone] and inform us of your arrival.  Please call [phone] if you have any problems the morning of surgery.  Remember: Do not eat food or drink liquids, including water, after midnight:tonight  You may brush your teeth the morning of surgery.  DO NOT wear jewelry, eye make-up, lipstick,body lotion, or dark fingernail polish.  (Polished toes are ok) You may wear deodorant.  If you are to be admitted after surgery, leave suitcase in car until your room has been assigned. Patients discharged on the day of surgery will not be allowed to drive home. Wear loose fitting, comfortable clothes for your ride home.

## 2012-11-18 ENCOUNTER — Ambulatory Visit (HOSPITAL_COMMUNITY)
Admission: RE | Admit: 2012-11-18 | Discharge: 2012-11-18 | Disposition: A | Discharge status: Home / Self Care | Payer: Medicare Other | Source: Ambulatory Visit | Attending: Obstetrics and Gynecology | Admitting: Obstetrics and Gynecology

## 2012-11-18 ENCOUNTER — Encounter (HOSPITAL_COMMUNITY): Payer: Medicare Other | Admitting: Anesthesiology

## 2012-11-18 ENCOUNTER — Encounter (HOSPITAL_BASED_OUTPATIENT_CLINIC_OR_DEPARTMENT_OTHER): Admission: RE | Payer: Self-pay | Source: Ambulatory Visit

## 2012-11-18 ENCOUNTER — Ambulatory Visit (HOSPITAL_COMMUNITY): Payer: Medicare Other | Admitting: Anesthesiology

## 2012-11-18 ENCOUNTER — Encounter (HOSPITAL_COMMUNITY)
Admission: RE | Disposition: A | Discharge status: Home / Self Care | Payer: Self-pay | Source: Ambulatory Visit | Attending: Obstetrics and Gynecology

## 2012-11-18 ENCOUNTER — Ambulatory Visit (HOSPITAL_BASED_OUTPATIENT_CLINIC_OR_DEPARTMENT_OTHER)
Admission: RE | Admit: 2012-11-18 | Payer: Medicare Other | Source: Ambulatory Visit | Admitting: Obstetrics and Gynecology

## 2012-11-18 DIAGNOSIS — Z853 Personal history of malignant neoplasm of breast: Secondary | ICD-10-CM | POA: Insufficient documentation

## 2012-11-18 DIAGNOSIS — R9389 Abnormal findings on diagnostic imaging of other specified body structures: Secondary | ICD-10-CM | POA: Insufficient documentation

## 2012-11-18 DIAGNOSIS — IMO0002 Reserved for concepts with insufficient information to code with codable children: Secondary | ICD-10-CM | POA: Insufficient documentation

## 2012-11-18 DIAGNOSIS — N840 Polyp of corpus uteri: Secondary | ICD-10-CM | POA: Insufficient documentation

## 2012-11-18 DIAGNOSIS — N950 Postmenopausal bleeding: Secondary | ICD-10-CM | POA: Insufficient documentation

## 2012-11-18 DIAGNOSIS — N854 Malposition of uterus: Secondary | ICD-10-CM | POA: Insufficient documentation

## 2012-11-18 HISTORY — PX: DILATATION & CURRETTAGE/HYSTEROSCOPY WITH RESECTOCOPE: SHX5572

## 2012-11-18 SURGERY — DILATATION & CURETTAGE/HYSTEROSCOPY WITH RESECTOCOPE
Anesthesia: Choice

## 2012-11-18 SURGERY — DILATATION & CURETTAGE/HYSTEROSCOPY WITH RESECTOCOPE
Anesthesia: General | Wound class: Clean Contaminated

## 2012-11-18 MED ORDER — PROPOFOL 10 MG/ML IV EMUL
INTRAVENOUS | Status: AC
  Filled 2012-11-18: qty 20

## 2012-11-18 MED ORDER — KETOROLAC TROMETHAMINE 30 MG/ML IJ SOLN
INTRAMUSCULAR | Status: DC | PRN
  Administered 2012-11-18: 30 mg via INTRAVENOUS

## 2012-11-18 MED ORDER — FENTANYL CITRATE 0.05 MG/ML IJ SOLN
INTRAMUSCULAR | Status: AC
  Filled 2012-11-18: qty 2

## 2012-11-18 MED ORDER — CHLOROPROCAINE HCL 1 % IJ SOLN
INTRAMUSCULAR | Status: DC | PRN
  Administered 2012-11-18: 20 mL

## 2012-11-18 MED ORDER — MIDAZOLAM HCL 2 MG/2ML IJ SOLN
INTRAMUSCULAR | Status: AC
  Filled 2012-11-18: qty 2

## 2012-11-18 MED ORDER — PROPOFOL 10 MG/ML IV BOLUS
INTRAVENOUS | Status: DC | PRN
  Administered 2012-11-18: 140 mg via INTRAVENOUS

## 2012-11-18 MED ORDER — DEXAMETHASONE SODIUM PHOSPHATE 10 MG/ML IJ SOLN
INTRAMUSCULAR | Status: AC
  Filled 2012-11-18: qty 1

## 2012-11-18 MED ORDER — ONDANSETRON HCL 4 MG/2ML IJ SOLN
INTRAMUSCULAR | Status: DC | PRN
  Administered 2012-11-18: 4 mg via INTRAVENOUS

## 2012-11-18 MED ORDER — LACTATED RINGERS IV SOLN
INTRAVENOUS | Status: DC
  Administered 2012-11-18: 08:00:00 via INTRAVENOUS

## 2012-11-18 MED ORDER — GLYCINE 1.5 % IR SOLN
Status: DC | PRN
  Administered 2012-11-18: 3000 mL

## 2012-11-18 MED ORDER — LIDOCAINE HCL (CARDIAC) 20 MG/ML IV SOLN
INTRAVENOUS | Status: AC
  Filled 2012-11-18: qty 5

## 2012-11-18 MED ORDER — KETOROLAC TROMETHAMINE 30 MG/ML IJ SOLN
INTRAMUSCULAR | Status: AC
  Filled 2012-11-18: qty 1

## 2012-11-18 MED ORDER — DEXAMETHASONE SODIUM PHOSPHATE 10 MG/ML IJ SOLN
INTRAMUSCULAR | Status: DC | PRN
  Administered 2012-11-18: 10 mg via INTRAVENOUS

## 2012-11-18 MED ORDER — FENTANYL CITRATE 0.05 MG/ML IJ SOLN
INTRAMUSCULAR | Status: DC | PRN
  Administered 2012-11-18 (×2): 50 ug via INTRAVENOUS

## 2012-11-18 MED ORDER — CHLOROPROCAINE HCL 1 % IJ SOLN
INTRAMUSCULAR | Status: AC
  Filled 2012-11-18: qty 30

## 2012-11-18 MED ORDER — IBUPROFEN 800 MG PO TABS: 800.0000 mg | ORAL_TABLET | Freq: Three times a day (TID) | ORAL | Status: DC | PRN

## 2012-11-18 MED ORDER — MIDAZOLAM HCL 5 MG/5ML IJ SOLN
INTRAMUSCULAR | Status: DC | PRN
  Administered 2012-11-18: 2 mg via INTRAVENOUS

## 2012-11-18 MED ORDER — ONDANSETRON HCL 4 MG/2ML IJ SOLN
INTRAMUSCULAR | Status: AC
  Filled 2012-11-18: qty 2

## 2012-11-18 MED ORDER — HYDROCORTISONE SOD SUCCINATE 100 MG IJ SOLR
100.0000 mg | Freq: Once | INTRAMUSCULAR | Status: AC
  Administered 2012-11-18: 08:00:00 via INTRAVENOUS
  Filled 2012-11-18: qty 2

## 2012-11-18 SURGICAL SUPPLY — 16 items
CANISTER SUCT 3000ML (MISCELLANEOUS) ×2 IMPLANT
CATH ROBINSON RED A/P 16FR (CATHETERS) ×2 IMPLANT
CLOTH BEACON ORANGE TIMEOUT ST (SAFETY) ×2 IMPLANT
CONTAINER PREFILL 10% NBF 60ML (FORM) ×4 IMPLANT
DRESSING TELFA 8X3 (GAUZE/BANDAGES/DRESSINGS) ×2 IMPLANT
ELECT REM PT RETURN 9FT ADLT (ELECTROSURGICAL) ×2
ELECTRODE REM PT RTRN 9FT ADLT (ELECTROSURGICAL) ×1 IMPLANT
GLOVE BIOGEL PI IND STRL 7.0 (GLOVE) ×2 IMPLANT
GLOVE BIOGEL PI INDICATOR 7.0 (GLOVE) ×2
GLOVE ECLIPSE 6.5 STRL STRAW (GLOVE) ×2 IMPLANT
GOWN STRL REIN XL XLG (GOWN DISPOSABLE) ×4 IMPLANT
LOOP ANGLED CUTTING 22FR (CUTTING LOOP) IMPLANT
PACK HYSTEROSCOPY LF (CUSTOM PROCEDURE TRAY) ×2 IMPLANT
PAD OB MATERNITY 4.3X12.25 (PERSONAL CARE ITEMS) ×2 IMPLANT
TOWEL OR 17X24 6PK STRL BLUE (TOWEL DISPOSABLE) ×4 IMPLANT
WATER STERILE IRR 1000ML POUR (IV SOLUTION) ×2 IMPLANT

## 2012-11-18 NOTE — Anesthesia Preprocedure Evaluation (Signed)
Anesthesia Evaluation  Patient identified by MRN, date of birth, ID band Patient awake    Reviewed: Allergy & Precautions, H&P , Patient's Chart, lab work & pertinent test results, reviewed documented beta blocker date and time   Airway Mallampati: II TM Distance: >3 FB Neck ROM: full    Dental no notable dental hx.    Pulmonary  breath sounds clear to auscultation  Pulmonary exam normal       Cardiovascular Rhythm:regular Rate:Normal     Neuro/Psych    GI/Hepatic   Endo/Other    Renal/GU      Musculoskeletal   Abdominal   Peds  Hematology   Anesthesia Other Findings RA    Stable prednisone dose, FROM of neck Distant HX of PONV Nl EKG  Reproductive/Obstetrics                           Anesthesia Physical Anesthesia Plan  ASA: II  Anesthesia Plan:    Post-op Pain Management:    Induction: Intravenous  Airway Management Planned: LMA  Additional Equipment:   Intra-op Plan:   Post-operative Plan:   Informed Consent: I have reviewed the patients History and Physical, chart, labs and discussed the procedure including the risks, benefits and alternatives for the proposed anesthesia with the patient or authorized representative who has indicated his/her understanding and acceptance.   Dental Advisory Given and Dental advisory given  Plan Discussed with: CRNA and Surgeon  Anesthesia Plan Comments:         Anesthesia Quick Evaluation

## 2012-11-18 NOTE — Transfer of Care (Signed)
Immediate Anesthesia Transfer of Care Note  Patient: Alejandra Callahan  Procedure(s) Performed: Procedure(s): DILATATION & CURETTAGE/HYSTEROSCOPY WITH RESECTOCOPE (N/A)  Patient Location: PACU  Anesthesia Type:General  Level of Consciousness: awake, alert  and oriented  Airway & Oxygen Therapy: Patient Spontanous Breathing and Patient connected to nasal cannula oxygen  Post-op Assessment: Report given to PACU RN and Post -op Vital signs reviewed and stable  Post vital signs: stable  Complications: No apparent anesthesia complications

## 2012-11-18 NOTE — Brief Op Note (Signed)
11/18/2012  9:57 AM  PATIENT:  Alejandra Callahan  67 y.o. female  PRE-OPERATIVE DIAGNOSIS:  Postmenopausal Bleeding, Endometrial Mass/polyp    POST-OPERATIVE DIAGNOSIS:  Postmenopausal Bleeding, Endometrial thickening, suspect endometrial cancer  PROCEDURE:  Diagnostic hysteroscopy, dilation and  currettage  SURGEON:  Surgeon(s) and Role:    * Geneve Kimpel Cathie Beams, MD - Primary  PHYSICIAN ASSISTANT:   ASSISTANTS: none   ANESTHESIA:   general and paracervical block FINDINGS: Diffuse thickening w/ abnormal vessels throughout. ostias not seen EBL:  Total I/O In: 800 [I.V.:800] Out: 160 [Urine:150; Blood:10]  BLOOD ADMINISTERED:none  DRAINS: none   LOCAL MEDICATIONS USED:  OTHER nesicaine  SPECIMEN:  Source of Specimen:  emc  DISPOSITION OF SPECIMEN:  PATHOLOGY  COUNTS:  YES  TOURNIQUET:  * No tourniquets in log *  DICTATION: .Other Dictation: Dictation Number [id number]  PLAN OF CARE: Discharge to home after PACU  PATIENT DISPOSITION:  PACU - hemodynamically stable.   Delay start of Pharmacological VTE agent (>24hrs) due to surgical blood loss or risk of bleeding: no

## 2012-11-18 NOTE — H&P (Signed)
Alejandra Callahan is an 67 y.o. female. G2P2 MWF presents for mgmt of PMB with findings of endometrial mass on sonohysterogram. Hx notable for left breast cancer s/p mastectomy 2) chronic steroid use for RA  Pertinent Gynecological History: Menses: post-menopausal Bleeding: post menopausal bleeding Contraception: none DES exposure: denies Blood transfusions: none Sexually transmitted diseases: no past history Previous GYN Procedures: TL  Last mammogram: normal Date: 05/13/2012 Last pap: normal Date: 11/18/2011 OB History: G2, P2   Menstrual History No LMP recorded. Patient is postmenopausal.    Past Medical History  Diagnosis Date  . Cancer     breast  . Arthritis     Rheumatoid arthritis  . PONV (postoperative nausea and vomiting) 1982    Past Surgical History  Procedure Laterality Date  . Mastectomy  09/1980  . Tubal ligation      No family history on file.  Social History:  reports that she has never smoked. She does not have any smokeless tobacco history on file. She reports that she does not drink alcohol or use illicit drugs.  Allergies:  Allergies  Allergen Reactions  . Codeine Other (See Comments)    blurred vision, dizzy, loss of hearing    No prescriptions prior to admission    Review of Systems  All other systems reviewed and are negative.    There were no vitals taken for this visit. Physical Exam  Constitutional: She is oriented to person, place, and time. She appears well-developed and well-nourished.  HENT:  Head: Atraumatic.  Eyes: EOM are normal.  Neck: Neck supple.  Cardiovascular: Regular rhythm.   Respiratory: Breath sounds normal.  GI: Soft.  Neurological: She is alert and oriented to person, place, and time.  Skin: Skin is warm and dry.  Psychiatric: She has a normal mood and affect.    Results for orders placed during the hospital encounter of 11/17/12 (from the past 24 hour(s))  CBC     Status: None   Collection Time    11/17/12   2:10 PM      Result Value Range   WBC 10.0  4.0 - 10.5 K/uL   RBC 3.90  3.87 - 5.11 MIL/uL   Hemoglobin 12.6  12.0 - 15.0 g/dL   HCT 62.9  52.8 - 41.3 %   MCV 95.1  78.0 - 100.0 fL   MCH 32.3  26.0 - 34.0 pg   MCHC 34.0  30.0 - 36.0 g/dL   RDW 24.4  01.0 - 27.2 %   Platelets 242  150 - 400 K/uL    No results found.  Assessment/Plan: PMB Endometrial mass Chronic steroid use  P) dx hysteroscopy, D&C, resection of endometrial mass. Stress dose of steroid. Risk of surgery reviewed including infection, bleeding, uterine perforation and its mgmt/risk, thermal injury, injury to surrounding organ structures  Dory Demont A 11/18/2012, 5:22 AM

## 2012-11-19 NOTE — Op Note (Signed)
NAME:  Alejandra Callahan, Alejandra Callahan NO.:  [id number]  MEDICAL RECORD NO.:  [id number]  LOCATION:  [facility]                          FACILITY:  [facility]  PHYSICIAN:  Maxie Better, M.D.DATE OF BIRTH:  December 07, 1945  DATE OF PROCEDURE:  11/18/2012 DATE OF DISCHARGE:  11/18/2012                              OPERATIVE REPORT   PREOPERATIVE DIAGNOSES:  Postmenopausal bleeding.  History of left breast cancer.  Endometrial mass/polyp.  PROCEDURE:  Diagnostic hysteroscopy, Dilation and curettage.  POSTOPERATIVE DIAGNOSES:  Postmenopausal bleeding, endometrial thickening, history of left breast cancer.  ANESTHESIA:  General.  SURGEON:  Maxie Better, MD  ASSISTANT:  None.  PROCEDURE:  Under adequate general anesthesia, the patient was placed in the dorsal lithotomy position.  She was sterilely prepped and draped in usual fashion.  Bladder was catheterized for moderate amount of urine. Examination under anesthesia revealed anteverted uterus.  No adnexal masses could be appreciated.  A bivalve speculum was placed in the vagina. 1% Nesicaine used for paracervical block at 3 and 9 o'clock position. Single-tooth tenaculum was placed on the anterior lip of the cervix.  The cervix was then serially dilated up to a #27 Pratt dilator, and a resectoscope with a single loop was inserted into the uterine cavity.  No abnormal uterine cavity architecture was noted.  Frond-like lesions were noted with lots of vascularity and large blood vessels throughout.  The hysteroscope was removed.  The cavity was then gently curetted for moderate amount of tissue.  At that point, the procedure was then terminated by removing all instruments from the vagina.  SPECIMEN:  Endometrial curetting was sent to Pathology.  ESTIMATED BLOOD LOSS:  Minimal.  COMPLICATION:  None.  The patient tolerated the procedure well and was transferred to recovery room in stable condition.     Maxie Better,  M.D.    [place]/MEDQ  D:  11/18/2012  T:  11/19/2012  Job:  [id number]

## 2012-11-21 ENCOUNTER — Encounter (HOSPITAL_COMMUNITY): Payer: Self-pay | Admitting: Obstetrics and Gynecology

## 2012-11-21 NOTE — Anesthesia Postprocedure Evaluation (Signed)
  Anesthesia Post-op Note  Patient: [name]  Procedure(s) Performed: Procedure(s): DILATATION & CURETTAGE/HYSTEROSCOPY WITH RESECTOCOPE (N/A)  Patient is awake and responsive. Pain and nausea are reasonably well controlled. Vital signs are stable and clinically acceptable. Oxygen saturation is clinically acceptable. There are no apparent anesthetic complications at this time. Patient is ready for discharge.

## 2012-12-09 HISTORY — PX: VAGINAL HYSTERECTOMY: SHX2639

## 2013-01-16 ENCOUNTER — Encounter (INDEPENDENT_AMBULATORY_CARE_PROVIDER_SITE_OTHER): Payer: Self-pay

## 2013-01-16 ENCOUNTER — Encounter: Payer: Self-pay | Admitting: Genetic Counselor

## 2013-01-16 ENCOUNTER — Ambulatory Visit (HOSPITAL_BASED_OUTPATIENT_CLINIC_OR_DEPARTMENT_OTHER): Payer: Medicare Other | Admitting: Genetic Counselor

## 2013-01-16 ENCOUNTER — Other Ambulatory Visit: Payer: Medicare Other

## 2013-01-16 DIAGNOSIS — Z800 Family history of malignant neoplasm of digestive organs: Secondary | ICD-10-CM

## 2013-01-16 DIAGNOSIS — Z803 Family history of malignant neoplasm of breast: Secondary | ICD-10-CM

## 2013-01-16 DIAGNOSIS — IMO0002 Reserved for concepts with insufficient information to code with codable children: Secondary | ICD-10-CM

## 2013-01-16 DIAGNOSIS — C55 Malignant neoplasm of uterus, part unspecified: Secondary | ICD-10-CM

## 2013-01-16 DIAGNOSIS — C50912 Malignant neoplasm of unspecified site of left female breast: Secondary | ICD-10-CM

## 2013-01-16 NOTE — Progress Notes (Signed)
Dr.  Alben Deeds requested a consultation for genetic counseling and risk assessment for Alejandra Callahan, a 68 y.o. female, for discussion of her personal history of breast and uterine cancer and family history of breast and colon cancer.  She presents to clinic today to discuss the possibility of a genetic predisposition to cancer, and to further clarify her risks, as well as her family members' risks for cancer.   HISTORY OF PRESENT ILLNESS: In 1982, at the age of 23, Alejandra Callahan was diagnosed with breast cancer of the left breast. This was treated with unilateral mastectomy.  In November 8003, Alejandra Callahan was diagnosed with uterine cancer.  She had a [facility]BSO at [facility] on December 08, 2012. She did not need chemotherapy or radiation with this cancer.  She has had a colonoscopy which found several non-cancerous polyps.  She is due for another colonoscopy this year. In 2002 she had BRCA testing that she paid out of pocket for and was negative for mutations within these genes.  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  They suggested that she be seen for additional genetic testing through a clinical laboratory.   Past Medical History  Diagnosis Date  . Cancer     breast  . Arthritis     Rheumatoid arthritis  . PONV (postoperative nausea and vomiting) 1982  . Breast cancer 09/1980    left unilateral mastectomy, no chemo or radiatioin    Past Surgical History  Procedure Laterality Date  . Mastectomy  09/1980  . Tubal ligation    . Dilatation & currettage/hysteroscopy with resectocope N/A 11/18/2012    Procedure: DILATATION & CURETTAGE/HYSTEROSCOPY WITH RESECTOCOPE;  Surgeon: Marvene Staff, MD;  Location: [facility];  Service: Gynecology;  Laterality: N/A;    History   Social History  . Marital Status: Married    Spouse Name: N/A    Number of Children: 2  . Years of Education: N/A   Social History Main Topics  .  Smoking status: Never Smoker   . Smokeless tobacco: None  . Alcohol Use: No  . Drug Use: No  . Sexual Activity: None   Other Topics Concern  . None   Social History Narrative  . None    REPRODUCTIVE HISTORY AND PERSONAL RISK ASSESSMENT FACTORS: Menarche was at age 68.   postmenopausal Uterus Intact: no Ovaries Intact: no G2P2A0,  She has not previously undergone treatment for infertility.   Oral Contraceptive use: 0 years   She has not used HRT in the past.    FAMILY HISTORY:  We obtained a detailed, 4-generation family history.  Significant diagnoses are listed below: Family History  Problem Relation Age of Onset  . Breast cancer Mother 65  . Heart attack Mother   . Colon cancer Father 62  . Breast cancer Sister 61  . Heart attack Paternal Grandfather   The patient's mother was an only child, and her father had 32 brothers and sisters.  Patient's maternal ancestors are of [place] descent, and paternal ancestors are of [place] descent. There is no reported Ashkenazi Jewish ancestry. There is no known consanguinity.  GENETIC COUNSELING ASSESSMENT: Alejandra Callahan is a 68 y.o. female with a personal history of breast and uterine cancer which somewhat suggestive of a hereditary cancer syndrome and predisposition to cancer. We, therefore, discussed and recommended the following at today's visit.   DISCUSSION: We reviewed the characteristics, features  and inheritance patterns of hereditary cancer syndromes. We also discussed genetic testing, including the appropriate family members to test, the process of testing, insurance coverage and turn-around-time for results. We reviewed hereditary cancer syndromes and discussed that BRCA testing has been updated since she had last had it performed.  We reviewed some of the genes that we are concerned about for both breast and uterine cancer including PTEN and the Lynch genes.  We briefly reviewed BARD1 in that it is a hereditary cancer gene with  an increased risk for breast cancer.  We discussed that if she was found to have a deleterious mutation within this gene we would review her risks further.  PLAN: After considering the risks, benefits, and limitations, Alejandra Callahan provided informed consent to pursue genetic testing and the blood sample will be sent to [facility] for analysis of the Breast/Ovarian cancer panel. We discussed the implications of a positive, negative and/ or variant of uncertain significance genetic test result. Results should be available within approximately 3 weeks' time, at which point they will be disclosed by telephone to Alejandra Callahan, as will any additional recommendations warranted by these results. Alejandra Callahan will receive a summary of her genetic counseling visit and a copy of her results once available. This information will also be available in Epic. We encouraged Alejandra Callahan WELGE to remain in contact with cancer genetics annually so that we can continuously update the family history and inform her of any changes in cancer genetics and testing that may be of benefit for her family. Avyn C Dudding's questions were answered to her satisfaction today. Our contact information was provided should additional questions or concerns arise.  The patient was seen for a total of 60 minutes, greater than 50% of which was spent face-to-face counseling.  This note will also be sent to the referring provider via the electronic medical record. The patient will be supplied with a summary of this genetic counseling discussion as well as educational information on the discussed hereditary cancer syndromes following the conclusion of their visit.   Patient was discussed with Dr. Marcy Panning.   _______________________________________________________________________ For Office Staff:  Number of people involved in session: 1 Was an Intern/ student involved with case: yes

## 2013-01-27 ENCOUNTER — Telehealth: Payer: Self-pay | Admitting: Genetic Counselor

## 2013-01-27 NOTE — Telephone Encounter (Signed)
Could not leave a message.

## 2013-01-31 ENCOUNTER — Telehealth: Payer: Self-pay | Admitting: Genetic Counselor

## 2013-01-31 NOTE — Telephone Encounter (Signed)
Left good news message on genetic test results.  Asked that she call me back.

## 2013-02-01 ENCOUNTER — Encounter: Payer: Self-pay | Admitting: Genetic Counselor

## 2013-02-01 ENCOUNTER — Telehealth: Payer: Self-pay | Admitting: Genetic Counselor

## 2013-02-01 NOTE — Telephone Encounter (Signed)
Revealed negative genetic testing.  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 

## 2013-03-29 ENCOUNTER — Encounter: Payer: Self-pay | Admitting: Genetic Counselor

## 2013-05-18 ENCOUNTER — Encounter: Payer: Self-pay | Admitting: Gynecologic Oncology

## 2013-05-18 ENCOUNTER — Ambulatory Visit: Payer: Medicare Other | Attending: Gynecologic Oncology | Admitting: Gynecologic Oncology

## 2013-05-18 VITALS — BP 109/56 | HR 78 | Temp 97.8°F | Resp 18 | Ht 60.0 in | Wt 112.7 lb

## 2013-05-18 DIAGNOSIS — C549 Malignant neoplasm of corpus uteri, unspecified: Secondary | ICD-10-CM | POA: Insufficient documentation

## 2013-05-18 DIAGNOSIS — Z9010 Acquired absence of unspecified breast and nipple: Secondary | ICD-10-CM | POA: Insufficient documentation

## 2013-05-18 DIAGNOSIS — M069 Rheumatoid arthritis, unspecified: Secondary | ICD-10-CM | POA: Insufficient documentation

## 2013-05-18 DIAGNOSIS — C541 Malignant neoplasm of endometrium: Secondary | ICD-10-CM | POA: Insufficient documentation

## 2013-05-18 DIAGNOSIS — Z79899 Other long term (current) drug therapy: Secondary | ICD-10-CM | POA: Insufficient documentation

## 2013-05-18 DIAGNOSIS — Z853 Personal history of malignant neoplasm of breast: Secondary | ICD-10-CM | POA: Insufficient documentation

## 2013-05-18 NOTE — Patient Instructions (Addendum)
Return to clinic in 4 months to see Dr. Garwin Brothers and call for an appointment with Dr. Alycia Rossetti 4 months after that.

## 2013-05-18 NOTE — Progress Notes (Signed)
Consult Note: Gyn-Onc  [name] 68 y.o. female  CC:  Chief Complaint  Patient presents with  . Endo Ca    Follow up     HPI: HISTORY: MARGARITE VESSEL is a 68 y.o. woman who is seen in consultation at the request of Dr. 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 The patient has a history of left breast cancer in 1982. She was treated with radical mastectomy with lymphadenectomy. She did not undergo chemotherapy, radiation, or hormonal therapy. She is experiencing postmenopausal spotting over the last several months and had a normal endometrial biopsy. An endometrial curettage was performed in the operating room which revealed a grade 2 endometrioid adenocarcinoma with squamous differentiation.   The patient has no current complaints. 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 This gene may have a propensity for increased risk of breast and ovarian cancer. She underwent testing secondary to a strong family history and personal history of early breast cancer. She recently had repeat testing that revealed negative genetic testing. 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  Oncology Summary:    Endometrial cancer    11/28/2012  Initial Diagnosis  PMB --> EMB: Grade 2 EC    12/09/2012  Surgery  RATLH/BSO/PPALND with SLN per [facility] protocol.   Surgical Findings:  Normal appearing uterus, tubes and ovaries. Thin adhesion between colon and left adnexa. Normal appearing pelvic and PA nodes. No evidence of metastatic disease. One sentinel node in left obturator space.  Pathology:  Diagnosis: A: Sentinel lymph node #1, left obturator, excision - Fibrofatty soft tissue, no lymph node identified, no tumor seen   B:  Colon, epiploic nodule, excision  - Calcified fibrous tissue with foreign body giant cell reaction, no tumor seen  C: Lymph nodes, left periaortic, regional node excision  - One lymph node with no evidence of carcinoma (0/1)  D: Lymph nodes, right periaortic, regional node excision  - Two lymph nodes with no evidence of carcinoma (0/2)  E: Lymph nodes, left pelvic, regional excision  - Three lymph node with no evidence of carcinoma (0/3)  F: Lymph nodes, right pelvic, regional node excision  - Four lymph nodes with no evidence of carcinoma (0/4)  G: Uterus, cervix, bilateral tubes and ovaries, hysterectomy and bilateral salpingo-oophorectomy   Histologic type: Endometrioid adenocarcinoma with squamous differentiation (G7)  Histologic grade: FIGO grade 3 (architecture grade 2, nuclear grade 3)  Tumor site: Endometrium  Tumor size: 5.2 cm (gross)  Myometrial invasion: Inner half Depth: 4 mm Wall thickness: 13 mm Percent: 31% (G7)   Serosal involvement: Not identified  Lower uterine segment involvement: Not identified  Cervical involvement: Not identified  Adnexal involvement: Not identified  Other involved sites: N/A  Cervical/vaginal margin and distance: Widely free   Lymphovascular space invasion: Not identified  Right ovary: Atrophic ovary with no specific pathologic abnormality, no tumor seen Left ovary: Atrophic ovary with no specific pathologic abnormality, no tumor seen  Right fallopian tube: No specific pathologic abnormality, no tumor seen Left fallopian tube: No specific pathologic abnormality, no tumor seen   Stage/Disposition: INFANTOF VILLAGOMEZ is a 68 y.o. woman with Stage IA Grade 3 Endometrial Adenocarcinoma. Her disposition is to close follow  up.  Interval History:  She's overall doing quite well. She states that she needed to have some prescriptions refilled and call Dr. cousins office. She went in for an exam which was required prior to her refill  prescription being called in and had a negative Pap smear in April. She's overall doing well. She had a left leg lesion removed it was benign. She has followup with her dermatologist. There are no new medical problems in her family. Her primary physician is Dr. Meade Maw in [place]. Dr. Garwin Brothers arranges her mammograms.  Review of Systems  Constitutional: Denies fever. Skin: No rash Cardiovascular: No chest pain, shortness of breath Pulmonary: No cough or wheeze.  Gastro Intestinal: No nausea, vomiting, constipation, or diarrhea reported. No change in bowel movement.  Genitourinary: No frequency, urgency, or dysuria.  Denies vaginal bleeding and discharge.  Musculoskeletal: No myalgia, arthralgia, joint swelling or pain.  Neurologic: No weakness, numbness, or change in gait.  Psychology: No changes   Current Meds:  Outpatient Encounter Prescriptions as of 05/18/2013  Medication Sig  . alendronate (FOSAMAX) 10 MG tablet Take 10 mg by mouth daily before breakfast. Take with a full glass of water on an empty stomach.  . Calcium Carbonate-Vitamin D (OS-CAL 500 + D PO) Take 1 tablet by mouth daily.  . cholecalciferol (VITAMIN D) 1000 UNITS tablet Take 1,000 Units by mouth daily.  . folic acid (FOLVITE) 557 MCG tablet Take 800 mcg by mouth daily.   . hydroxychloroquine (PLAQUENIL) 200 MG tablet Take by mouth daily.  . Multiple Vitamin (MULTIVITAMIN) tablet Take 1 tablet by mouth daily.  . predniSONE (DELTASONE) 5 MG tablet Take 5 mg by mouth daily with breakfast.  . raloxifene (EVISTA) 60 MG tablet Take 60 mg by mouth daily.  Marland Kitchen ibuprofen (ADVIL,MOTRIN) 800 MG tablet Take 1 tablet (800 mg total) by mouth every 8 (eight) hours as needed.    Allergy:  Allergies  Allergen Reactions  . Codeine Other (See Comments)    blurred vision, dizzy, loss of hearing    Social Hx:   History   Social History  . Marital Status: Married    Spouse Name: N/A    Number of Children: 2  . Years of  Education: N/A   Occupational History  . Not on file.   Social History Main Topics  . Smoking status: Never Smoker   . Smokeless tobacco: Not on file  . Alcohol Use: No  . Drug Use: No  . Sexual Activity: Not on file   Other Topics Concern  . Not on file   Social History Narrative  . No narrative on file    Past Surgical Hx:  Past Surgical History  Procedure Laterality Date  . Mastectomy  09/1980  . Tubal ligation    . Dilatation & currettage/hysteroscopy with resectocope N/A 11/18/2012    Procedure: DILATATION & CURETTAGE/HYSTEROSCOPY WITH RESECTOCOPE;  Surgeon: Marvene Staff, MD;  Location: [facility];  Service: Gynecology;  Laterality: N/A;    Past Medical Hx:  Past Medical History  Diagnosis Date  . Cancer     breast  . Arthritis     Rheumatoid arthritis  . PONV (postoperative nausea and vomiting) 1982  . Breast cancer 09/1980    left unilateral mastectomy, no chemo or radiatioin    Oncology Hx:    Endometrial ca   11/28/2012 Initial Diagnosis Endometrial ca, Embx Grade 2   12/09/2012 Surgery IAG3, close follow up    Family Hx:  Family History  Problem Relation Age of Onset  . Breast cancer Mother 42  . Heart attack Mother 34  . Colon cancer Father 96  . Heart attack Father 67  . Breast cancer Sister 68  . Heart attack Paternal Grandfather 30  . Breast cancer Other     mother's paternal first cousin dx in late 40s-early 38s    Vitals:  Blood pressure 109/56, pulse 78, temperature 97.8 F (36.6 C), temperature source Oral, resp. rate 18, height 5' (1.524 m), weight 112 lb 11.2 oz (51.12 kg).  Physical Exam: Well-nourished well-developed female in no acute distress.  Neck: Supple, no lymphadenopathy no thyromegaly.  Lungs: Clear to auscultation bilaterally.  Cardiovascular: Regular rate and rhythm.  Abdomen: Well-healed surgical incisions. Abdomen is soft, nontender, nondistended. There are no palpable masses or hepatosplenomegaly.  Groins:  No lymphadenopathy.  Extremity: No edema.  Pelvic: Normal female genitalia no atrophic. Vagina is markedly atrophic. Vaginal cuff is visualized. There are no visible lesions. Bimanual examination reveals no masses or nodularity. Rectal confirms.  Assessment/Plan: MAKALYA NAVE 68 y.o. female with a stage IA grade 3 endometrioid adenocarcinoma status post definitive surgery 12/09/2012.. She'll return to see me in 8 months in [place] and in the interim, she will see Dr. Garwin Brothers.  Ezrah Dembeck A. Alycia Rossetti, MD 05/18/2013, 9:32 AM

## 2013-06-06 ENCOUNTER — Telehealth: Payer: Self-pay | Admitting: *Deleted

## 2013-06-06 ENCOUNTER — Encounter: Payer: Self-pay | Admitting: *Deleted

## 2013-06-06 NOTE — Telephone Encounter (Signed)
Information updated per pt request

## 2013-06-06 NOTE — Telephone Encounter (Signed)
Per pt the following changes need to be made in her chart. Pap smear March 25th at [facility]: Pt started hydroxychloroquine April 9th. Pt take 70mg  fosamax 1 x week,

## 2014-02-01 ENCOUNTER — Ambulatory Visit: Payer: Medicare Other | Attending: Gynecologic Oncology | Admitting: Gynecologic Oncology

## 2014-02-01 ENCOUNTER — Encounter: Payer: Self-pay | Admitting: Gynecologic Oncology

## 2014-02-01 VITALS — BP 94/56 | HR 71 | Temp 98.3°F | Resp 18 | Ht 60.0 in | Wt 117.1 lb

## 2014-02-01 DIAGNOSIS — Z9010 Acquired absence of unspecified breast and nipple: Secondary | ICD-10-CM | POA: Diagnosis not present

## 2014-02-01 DIAGNOSIS — Z9889 Other specified postprocedural states: Secondary | ICD-10-CM | POA: Insufficient documentation

## 2014-02-01 DIAGNOSIS — C541 Malignant neoplasm of endometrium: Secondary | ICD-10-CM | POA: Diagnosis not present

## 2014-02-01 DIAGNOSIS — Z853 Personal history of malignant neoplasm of breast: Secondary | ICD-10-CM | POA: Diagnosis not present

## 2014-02-01 DIAGNOSIS — Z1371 Encounter for nonprocreative screening for genetic disease carrier status: Secondary | ICD-10-CM

## 2014-02-01 NOTE — Patient Instructions (Signed)
We would like to see you back in our gyn clinic in September. Please call our office in July to schedule your appointment.

## 2014-02-01 NOTE — Progress Notes (Signed)
Consult Note: Gyn-Onc  Alejandra Callahan 69 y.o. female  CC:  Chief Complaint  Patient presents with  . Endometrial Ca    HPI: HISTORY: Alejandra Callahan is a 69 y.o. woman who is seen in consultation at the request of Dr. 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 The patient has a history of left breast cancer in 1982. She was treated with radical mastectomy with lymphadenectomy. She did not undergo chemotherapy, radiation, or hormonal therapy. She is experiencing postmenopausal spotting over the last several months and had a normal endometrial biopsy. An endometrial curettage was performed in the operating room which revealed a grade 2 endometrioid adenocarcinoma with squamous differentiation.   The patient has no current complaints. 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 This gene may have a propensity for increased risk of breast and ovarian cancer. She underwent testing secondary to a strong family history and personal history of early breast cancer. She recently had repeat testing that revealed negative genetic testing. 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 This was not found on this test, indicating that the BARD1 VUS found at [facility] is most likely thought to be benign through [facility]. 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  Oncology Summary:    Endometrial cancer    11/28/2012  Initial Diagnosis  PMB --> EMB: Grade 2 EC    12/09/2012  Surgery  RATLH/BSO/PPALND with SLN per [facility] protocol.   Surgical Findings:  Normal appearing uterus, tubes and ovaries. Thin adhesion between colon and left adnexa. Normal appearing pelvic and PA nodes. No evidence of metastatic disease. One sentinel node in left obturator space.  Pathology:  Diagnosis: A: Sentinel lymph node #1, left obturator, excision - Fibrofatty soft tissue, no lymph node identified, no tumor seen   B: Colon,  epiploic nodule, excision  - Calcified fibrous tissue with foreign body giant cell reaction, no tumor seen  C: Lymph nodes, left periaortic, regional node excision  - One lymph node with no evidence of carcinoma (0/1)  D: Lymph nodes, right periaortic, regional node excision  - Two lymph nodes with no evidence of carcinoma (0/2)  E: Lymph nodes, left pelvic, regional excision  - Three lymph node with no evidence of carcinoma (0/3)  F: Lymph nodes, right pelvic, regional node excision  - Four lymph nodes with no evidence of carcinoma (0/4)  G: Uterus, cervix, bilateral tubes and ovaries, hysterectomy and bilateral salpingo-oophorectomy   Histologic type: Endometrioid adenocarcinoma with squamous differentiation (G7)  Histologic grade: FIGO grade 3 (architecture grade 2, nuclear grade 3)  Tumor site: Endometrium  Tumor size: 5.2 cm (gross)  Myometrial invasion: Inner half Depth: 4 mm Wall thickness: 13 mm Percent: 31% (G7)   Serosal involvement: Not identified  Lower uterine segment involvement: Not identified  Cervical involvement: Not identified  Adnexal involvement: Not identified  Other involved sites: N/A  Cervical/vaginal margin and distance: Widely free   Lymphovascular space invasion: Not identified  Right ovary: Atrophic ovary with no specific pathologic abnormality, no tumor seen Left ovary: Atrophic ovary with no specific pathologic abnormality, no tumor seen  Right fallopian tube: No specific pathologic abnormality, no tumor seen Left fallopian tube: No specific pathologic abnormality, no tumor seen   Stage/Disposition: Alejandra Callahan is a 69 y.o. woman with Stage IA Grade 3 Endometrial Adenocarcinoma. Her disposition is to close follow up.  Interval History:  She's  overall doing quite well. She states that she needed to have some prescriptions refilled and call Dr. Garwin Brothers office. She went in for an exam which was required prior to her refill prescription  being called in and had a negative Pap smear in April. She's overall doing well.  There are no new medical problems in her family. Her primary physician is Dr. Meade Maw in [place]. Dr. Garwin Brothers arranges her mammograms. I last saw her in May 2015. She states that she is due for a bone density this coming year. She is up-to-date on her mammograms.  Review of Systems  Constitutional: Denies fever. Skin: No rash Cardiovascular: No chest pain, shortness of breath Pulmonary: No cough or wheeze.  Gastro Intestinal: No nausea, vomiting, constipation, or diarrhea reported. No change in bowel movement.  Genitourinary: No frequency, urgency, or dysuria.  Denies vaginal bleeding and discharge.  Musculoskeletal: No myalgia, arthralgia, joint swelling or pain.  Neurologic: No weakness, numbness, or change in gait.  Psychology: No changes   Current Meds:  Outpatient Encounter Prescriptions as of 02/01/2014  Medication Sig  . alendronate (FOSAMAX) 10 MG tablet Take 70 mg by mouth once a week. Take with a full glass of water on an empty stomach.  . Calcium-Vitamin D (CALTRATE 600 PLUS-VIT D PO) Take 2 each by mouth daily.  . cholecalciferol (VITAMIN D) 1000 UNITS tablet Take 1,000 Units by mouth daily.  . folic acid (FOLVITE) 010 MCG tablet Take 800 mcg by mouth daily.   . hydroxychloroquine (PLAQUENIL) 200 MG tablet Take by mouth daily.  . methotrexate (RHEUMATREX) 2.5 MG tablet Take 2.5 mg by mouth 2 (two) times a week. Caution:Chemotherapy. Protect from light.  Pt to take 4 tablets on Friday and 4 tablets on Saturday  . Multiple Vitamin (MULTIVITAMIN) tablet Take 1 tablet by mouth daily.  . raloxifene (EVISTA) 60 MG tablet Take 60 mg by mouth daily.  Marland Kitchen ibuprofen (ADVIL,MOTRIN) 800 MG tablet Take 1 tablet (800 mg total) by mouth every 8 (eight) hours as needed. (Patient not taking: Reported on 02/01/2014)  . Multiple Vitamins-Minerals (MULTIVITAMIN WITH MINERALS) tablet Take by mouth.  . predniSONE  (DELTASONE) 5 MG tablet Take 5 mg by mouth daily with breakfast.    Allergy:  Allergies  Allergen Reactions  . Codeine Other (See Comments)    Blurred vision and dizziness blurred vision, dizzy, loss of hearing  . Other     TB test- arm swells/fever to extremity  . Tuberculin Ppd Other (See Comments)    Swelling and redness in arm  . Tylox [Oxycodone-Acetaminophen] Other (See Comments)    Blurred vision and dizziness Blurred vision, dizziness    Social Hx:   History   Social History  . Marital Status: Married    Spouse Name: N/A    Number of Children: 2  . Years of Education: N/A   Occupational History  . Not on file.   Social History Main Topics  . Smoking status: Never Smoker   . Smokeless tobacco: Not on file  . Alcohol Use: No  . Drug Use: No  . Sexual Activity: Not on file   Other Topics Concern  . Not on file   Social History Narrative    Past Surgical Hx:  Past Surgical History  Procedure Laterality Date  . Mastectomy  09/1980  . Tubal ligation    . Dilatation & currettage/hysteroscopy with resectocope N/A 11/18/2012    Procedure: [place];  Surgeon: Marvene Staff, MD;  Location: [facility];  Service: Gynecology;  Laterality: N/A;    Past Medical Hx:  Past Medical History  Diagnosis Date  . Cancer     breast  . Arthritis     Rheumatoid arthritis  . PONV (postoperative nausea and vomiting) 1982  . Breast cancer 09/1980    left unilateral mastectomy, no chemo or radiatioin    Oncology Hx:    Endometrial ca   11/28/2012 Initial Diagnosis Endometrial ca, Embx Grade 2   12/09/2012 Surgery IAG3, close follow up    Family Hx:  Family History  Problem Relation Age of Onset  . Breast cancer Mother 58  . Heart attack Mother 44  . Colon cancer Father 3  . Heart attack Father 73  . Breast cancer Sister 41  . Heart attack Paternal Grandfather 59  . Breast cancer Other     mother's paternal first  cousin dx in late 40s-early 35s    Vitals:  Blood pressure 94/56, pulse 71, temperature 98.3 F (36.8 C), temperature source Oral, resp. rate 18, height 5' (1.524 m), weight 117 lb 1.6 oz (53.116 kg), SpO2 99 %.  Physical Exam: Well-nourished well-developed female in no acute distress.  Neck: Supple, no lymphadenopathy no thyromegaly.  Lungs: Clear to auscultation bilaterally.  Cardiovascular: Regular rate and rhythm.  Abdomen: Well-healed surgical incisions. Abdomen is soft, nontender, nondistended. There are no palpable masses or hepatosplenomegaly.  Groins: No lymphadenopathy.  Extremity: No edema. + varicose veins left  Pelvic: Normal female genitalia no atrophic. Vagina is markedly atrophic. Vaginal cuff is visualized. There are no visible lesions. Bimanual examination reveals no masses or nodularity. Rectal confirms.  Assessment/Plan: Alejandra Callahan 69 y.o. female with a stage IA grade 3 endometrioid adenocarcinoma status post definitive surgery 12/09/2012.. She'll return to see me in 8 months in [place] and in the interim, she will see Dr. Garwin Brothers. She will continue every 4 month visits until she is 2 years out. When she hits her 2 year anniversary we will adjust her visits to every 6 months. She'll be due for a Pap smear when she sees Dr. Garwin Brothers in 4 months.  Bright Spielmann A., MD 02/01/2014, 10:53 AM

## 2014-04-19 ENCOUNTER — Ambulatory Visit (HOSPITAL_COMMUNITY): Payer: 59 | Admitting: Physical Therapy

## 2014-05-07 ENCOUNTER — Ambulatory Visit (HOSPITAL_COMMUNITY): Payer: Medicare Other | Attending: Rheumatology

## 2014-05-07 DIAGNOSIS — M25811 Other specified joint disorders, right shoulder: Secondary | ICD-10-CM | POA: Insufficient documentation

## 2014-05-09 ENCOUNTER — Ambulatory Visit (HOSPITAL_COMMUNITY): Payer: Medicare Other

## 2014-05-09 DIAGNOSIS — M7581 Other shoulder lesions, right shoulder: Principal | ICD-10-CM

## 2014-05-09 DIAGNOSIS — M778 Other enthesopathies, not elsewhere classified: Secondary | ICD-10-CM

## 2014-05-09 DIAGNOSIS — M25811 Other specified joint disorders, right shoulder: Secondary | ICD-10-CM | POA: Diagnosis not present

## 2014-05-09 NOTE — Therapy (Signed)
[place], [place], [address] Phone: [phone]   Fax:  [phone]  Occupational Therapy Evaluation  Patient Details  Name: Alejandra Callahan MRN: [id number] Date of Birth: [birth year] Referring Provider:  Hurley Cisco, MD  Encounter Date: 05/09/2014      OT End of Session - 05/09/14 0950    Visit Number 1   Number of Visits 1   Authorization Type [employer]   Authorization Time Period before 10th visit   Authorization - Visit Number 1   Authorization - Number of Visits 10   OT Start Time 6962   OT Stop Time 0918   OT Time Calculation (min) 24 min   Activity Tolerance Patient tolerated treatment well   Behavior During Therapy [facility] for tasks assessed/performed      Past Medical History  Diagnosis Date  . Cancer     breast  . Arthritis     Rheumatoid arthritis  . PONV (postoperative nausea and vomiting) 1982  . Breast cancer 09/1980    left unilateral mastectomy, no chemo or radiatioin    Past Surgical History  Procedure Laterality Date  . Mastectomy  09/1980  . Tubal ligation    . Dilatation & currettage/hysteroscopy with resectocope N/A 11/18/2012    Procedure: DILATATION & CURETTAGE/HYSTEROSCOPY WITH RESECTOCOPE;  Surgeon: Marvene Staff, MD;  Location: [facility];  Service: Gynecology;  Laterality: N/A;    There were no vitals filed for this visit.  Visit Diagnosis:  Tendonitis of shoulder, right - Plan: Ot plan of care cert/re-cert      Subjective Assessment - 05/09/14 0944    Subjective  S: I had a flair up of RA in December. It happens here and there and only lasts about 2 days as long as I take my Predisone.    Pertinent History Patient is a 69 y/o female s/p right shoulder tendonitis which seems to occured in December 2015 after she put up her Christmas tree. Pt reports that she believes she over did it. Symptoms today have resolved. Pt reports that she has RA and occassionally has  flair up where she'll experience increased pain in right upper arm/deltoid region. As long as patient takes her Predisone pain will resolve in approx. 2 days. Dr. Charlestine Night has referred patient to occupational therapy for evaluation and treatment.    Special Tests FOTO score: 98/100   Patient Stated Goals None stated.   Currently in Pain? No/denies           [facility] OT Assessment - 05/09/14 0857    Assessment   Diagnosis right tendonitis of shoulder   Onset Date --  Dec. 2015 (after decorating Christmas tree   Prior Therapy 07/11/14   Precautions   Precautions None   Restrictions   Weight Bearing Restrictions No   Balance Screen   Has the patient fallen in the past 6 months No   Home  Environment   Family/patient expects to be discharged to: Private residence   Prior Function   Level of Independence Independent with basic ADLs;Independent with gait   Vocation Retired   ADL   ADL comments Pt reports that today her symptoms have resolved. When she has RA flair up, pain is her only issue.   Mobility   Mobility Status Independent   Written Expression   Dominant Hand Right   Vision - History   Baseline Vision No visual deficits   Cognition   Overall Cognitive Status Within Functional  Limits for tasks assessed   ROM / Strength   AROM / PROM / Strength AROM;PROM;Strength   Palpation   Palpation No fascial restrictions in right UE.   AROM   Overall AROM Comments Assessed seated. IR/ER abducted.   AROM Assessment Site Shoulder   Right/Left Shoulder Right;Left   Right Shoulder Flexion 140 Degrees   Right Shoulder ABduction 136 Degrees   Right Shoulder Internal Rotation 36 Degrees   Right Shoulder External Rotation 79 Degrees   Left Shoulder Flexion 140 Degrees   Left Shoulder ABduction 140 Degrees   Left Shoulder Internal Rotation 36 Degrees   Left Shoulder External Rotation 79 Degrees   PROM   Overall PROM  Within functional limits for tasks performed   PROM Assessment Site  Shoulder   Right/Left Shoulder Right   Strength   Overall Strength Comments Assessed seated. IR/ER abducted   Strength Assessment Site Shoulder   Right/Left Shoulder Right;Left   Right Shoulder Flexion 4+/5   Right Shoulder ABduction 4+/5   Right Shoulder Internal Rotation 4-/5   Right Shoulder External Rotation 4+/5   Left Shoulder Flexion 4+/5   Left Shoulder ABduction 4+/5   Left Shoulder Internal Rotation --  4-/5   Left Shoulder External Rotation 4+/5                         OT Education - 05-10-2014 0914    Education provided Yes   Education Details Shoulder stretches and educated on using Ice/Heat pack   Person(s) Educated Patient   Methods Explanation;Demonstration;Handout   Comprehension Verbalized understanding;Returned demonstration          OT Short Term Goals - 05-10-14 0953    OT SHORT TERM GOAL #1   Title Patient will be educated on HEP.    Time 1   Period Days   Status Achieved                  Plan - May 10, 2014 0951    Clinical Impression Statement A: Patient is a 69 y/o female s/p right shoulder tendonitis who presents today with symptoms resolved. Patient has WFL ROM in BUE as well as functional strength. Patient reports that she has periods of flairs up due to her RA. Pt was given a HEP of shoulder stretches to complete at home when she experiences flair ups. Education also given regarding pain management using ice/heat pack/   Rehab Potential Excellent   OT Frequency 1x / week   OT Duration --  1 week   OT Treatment/Interventions Patient/family education   Plan P: 1 time eval with HEP.   Consulted and Agree with Plan of Care Patient          G-Codes - 05-10-14 0954    Functional Assessment Tool Used FOTO score: 98/100 (2% impaired)   Functional Limitation Carrying, moving and handling objects   Carrying, Moving and Handling Objects Current Status [phone]) At least 1 percent but less than 20 percent impaired, limited or  restricted   Carrying, Moving and Handling Objects Goal Status ([id number]) At least 1 percent but less than 20 percent impaired, limited or restricted   Carrying, Moving and Handling Objects Discharge Status [phone]) At least 1 percent but less than 20 percent impaired, limited or restricted      Problem List Patient Active Problem List   Diagnosis Date Noted  . BRCA negative 02/01/2014  . Endometrial ca 05/18/2013    Ailene Ravel, OTR/L,CBIS  [phone]  05/09/2014, 9:56 AM  [facility] [address] [place], [place], [address] Phone: [phone]   Fax:  [phone]

## 2014-05-09 NOTE — Patient Instructions (Addendum)
When having a flair up in pain use Ice or Heat pack. Place ice/heat where pain is for 20 minutes. Remove for 20 minutes and reapply if needed.  You can use ice/heat multiple times during the day.    Flexibility: Corner Stretch   Standing in corner with hands just above shoulder level and feet ____ inches from corner, lean forward until a comfortable stretch is felt across chest. Hold _10___ seconds. Repeat __3__ times per set. Do ____ sets per session. Do ____ sessions per day.  http://orth.exer.us/342   Copyright  VHI. All rights reserved.   Scapular Retraction (Standing)   With arms at sides, pinch shoulder blades together. Repeat _10___ times per set. Do ____ sets per session. Do ____ sessions per day.  http://orth.exer.us/944   Copyright  VHI. All rights reserved.   ROM: Towel Stretch - with Interior Rotation   Pull left arm up behind back by pulling towel up with other arm. Hold _10___ seconds. Repeat __3__ times per set. Do ____ sets per session. Do ____ sessions per day.  http://orth.exer.us/888   Copyright  VHI. All rights reserved.   Posterior Capsule Stretch   Stand or sit, one arm across body so hand rests over opposite shoulder. Gently push on crossed elbow with other hand until stretch is felt in shoulder of crossed arm. Hold _10__ seconds.  Repeat _3__ times per session. Do ___ sessions per day.  Copyright  VHI. All rights reserved.   Flexors Stretch, Standing   Stand near wall and slide arm up, with palm facing away from wall, by leaning toward wall. Hold _10__ seconds.  Repeat __3_ times per session. Do ___ sessions per day.  Copyright  VHI. All rights reserved.

## 2014-07-05 ENCOUNTER — Other Ambulatory Visit (HOSPITAL_COMMUNITY): Payer: Self-pay | Admitting: Internal Medicine

## 2014-07-05 ENCOUNTER — Ambulatory Visit (HOSPITAL_COMMUNITY)
Admission: RE | Admit: 2014-07-05 | Discharge: 2014-07-05 | Disposition: A | Discharge status: Home / Self Care | Payer: Medicare Other | Source: Ambulatory Visit | Attending: Internal Medicine | Admitting: Internal Medicine

## 2014-07-05 DIAGNOSIS — J449 Chronic obstructive pulmonary disease, unspecified: Secondary | ICD-10-CM | POA: Insufficient documentation

## 2014-07-05 DIAGNOSIS — Z853 Personal history of malignant neoplasm of breast: Secondary | ICD-10-CM | POA: Diagnosis not present

## 2014-07-05 DIAGNOSIS — R059 Cough, unspecified: Secondary | ICD-10-CM

## 2014-07-05 DIAGNOSIS — R05 Cough: Secondary | ICD-10-CM

## 2014-07-05 DIAGNOSIS — Z90710 Acquired absence of both cervix and uterus: Secondary | ICD-10-CM | POA: Insufficient documentation

## 2014-07-05 DIAGNOSIS — R918 Other nonspecific abnormal finding of lung field: Secondary | ICD-10-CM | POA: Insufficient documentation

## 2014-07-05 DIAGNOSIS — Z8542 Personal history of malignant neoplasm of other parts of uterus: Secondary | ICD-10-CM | POA: Insufficient documentation

## 2014-07-11 ENCOUNTER — Encounter (HOSPITAL_COMMUNITY): Payer: Medicare Other | Attending: Hematology & Oncology | Admitting: Hematology & Oncology

## 2014-07-11 ENCOUNTER — Encounter (HOSPITAL_COMMUNITY): Payer: Self-pay | Admitting: Hematology & Oncology

## 2014-07-11 VITALS — BP 103/48 | HR 87 | Temp 98.2°F | Resp 18 | Ht 58.5 in | Wt 113.8 lb

## 2014-07-11 DIAGNOSIS — C541 Malignant neoplasm of endometrium: Secondary | ICD-10-CM | POA: Insufficient documentation

## 2014-07-11 DIAGNOSIS — C50912 Malignant neoplasm of unspecified site of left female breast: Secondary | ICD-10-CM

## 2014-07-11 DIAGNOSIS — Z79899 Other long term (current) drug therapy: Secondary | ICD-10-CM | POA: Insufficient documentation

## 2014-07-11 DIAGNOSIS — Z853 Personal history of malignant neoplasm of breast: Secondary | ICD-10-CM | POA: Diagnosis not present

## 2014-07-11 DIAGNOSIS — R918 Other nonspecific abnormal finding of lung field: Secondary | ICD-10-CM | POA: Diagnosis not present

## 2014-07-11 DIAGNOSIS — M069 Rheumatoid arthritis, unspecified: Secondary | ICD-10-CM | POA: Insufficient documentation

## 2014-07-11 DIAGNOSIS — Z8542 Personal history of malignant neoplasm of other parts of uterus: Secondary | ICD-10-CM | POA: Diagnosis not present

## 2014-07-11 NOTE — Progress Notes (Signed)
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 female is here because of abnormal CXR with a history of breast and endometrial cancer. The patient is here with her husband at the request of Dr Nevada Crane to discuss her recently done CXR. She has a medical history of Stage I breast and endometrial cancer.The patient has had genetic testing done three times; in May of 2002, May of 2011, and January of 2015  Over the last few weeks, the patient began not feeling well with a cold and was prescribed Zithromax by Dr. Nevada Crane.  All symptoms after this medication had cleared except for congestion in her head and chest.  She notes that she had a cough which would not resolve and saw Loma Sousa who diagnosed her with Otitis media with effusion.   She was given zyrtec and notes that gradually her cough has improved.  She notes that it is still present but markedly better.   She had blood work done last Tuesday and her X-Ray was completed last Thursday.    The patient has experienced no weight loss and her energy is normal. She describes her appetite as baseline. She denies any other new symptoms than the cough which is now resolving.  She has Rheumatoid Arthritis and is currently taking Prednisone for her condition.  She has previously taken Sulfasalazine, Vioxx, and Humera.  She was in remission 2.5 years ago and it returned in July of 2014  Over the past week, she has had inflammation all  over her body including her upper left arm, right thumb, and right upper thigh.  She states that she could not raise her upper left arm yesterday along with having a stiff neck and a sore back over the weekend.  The patient's mammograms are done by Dr. Garwin Brothers   Her last screening colonoscopy was completed in December of 2010 by Dr. Sydell Axon  She has never had an Oncologist.    Endometrial ca   11/28/2012 Initial Diagnosis Endometrial ca, Embx Grade 2   12/09/2012 Surgery IAG3, close follow up     MEDICAL HISTORY:  Past Medical History  Diagnosis Date  . Cancer     breast  . Arthritis     Rheumatoid arthritis  . PONV (postoperative nausea and vomiting) 1982  . Breast cancer 09/1980    left unilateral mastectomy, no chemo or radiatioin  . Endometrial cancer 2014    SURGICAL HISTORY: Past Surgical History  Procedure Laterality Date  . Mastectomy  09/1980  . Tubal ligation    . Dilatation & currettage/hysteroscopy with resectocope N/A 11/18/2012    Procedure: DILATATION & CURETTAGE/HYSTEROSCOPY WITH RESECTOCOPE;  Surgeon: Marvene Staff, MD;  Location: [facility];  Service: Gynecology;  Laterality: N/A;  . Breast biopsy Left 09/11/80  . Vaginal hysterectomy  12/09/2012    SOCIAL HISTORY: History   Social History  . Marital Status: Married    Spouse Name: N/A  . Number of Children: 2  . Years of Education: N/A   Occupational History  . Not on file.  Social History Main Topics  . Smoking status: Never Smoker   . Smokeless tobacco: Never Used  . Alcohol Use: No  . Drug Use: No  . Sexual Activity: Not on file   Other Topics Concern  . Not on file   Social History Narrative  Married, 12 years 2 grandchildren, 2 children Ex-teacher and Microbiologist Non-smoker ETOH, none  FAMILY HISTORY: Family History  Problem Relation Age of Onset  . Breast cancer Mother 35  . Heart attack Mother 48  . Colon cancer Father 72  . Heart attack Father 73  . Breast  cancer Sister 79  . Heart attack Paternal Grandfather 32  . Breast cancer Other     mother's paternal first cousin dx in late 40s-early 55s   indicated that her mother is deceased. She indicated that her father is deceased. She indicated that both of her sisters are alive. She indicated that her maternal grandmother is deceased. She indicated that her maternal grandfather is deceased. She indicated that her paternal grandmother is deceased. She indicated that her paternal grandfather is deceased. She indicated that her other is deceased.  Mother deceased at 21, heart attack Father deceased at 70, congested heart failure 2 sisters, one with breast cancer  ALLERGIES:  is allergic to codeine; other; tuberculin ppd; and tylox.  MEDICATIONS:  Current Outpatient Prescriptions  Medication Sig Dispense Refill  . acetaminophen (TYLENOL) 500 MG tablet Take 500 mg by mouth every 6 (six) hours as needed.    Marland Kitchen alendronate (FOSAMAX) 10 MG tablet Take 70 mg by mouth once a week. Take with a full glass of water on an empty stomach.    . Calcium-Vitamin D (CALTRATE 600 PLUS-VIT D PO) Take 2 each by mouth daily.    . cholecalciferol (VITAMIN D) 1000 UNITS tablet Take 1,000 Units by mouth daily.    . folic acid (FOLVITE) 237 MCG tablet Take 800 mcg by mouth daily.     . hydroxychloroquine (PLAQUENIL) 200 MG tablet Take by mouth daily.    . methotrexate (RHEUMATREX) 2.5 MG tablet Take 2.5 mg by mouth 2 (two) times a week. Caution:Chemotherapy. Protect from light.  Pt to take 4 tablets on Friday and 4 tablets on Saturday    . Multiple Vitamin (MULTIVITAMIN) tablet Take 1 tablet by mouth daily.    . predniSONE (DELTASONE) 5 MG tablet Take 5 mg by mouth daily with breakfast.    . raloxifene (EVISTA) 60 MG tablet Take 60 mg by mouth daily.    . colchicine 0.6 MG tablet Take 0.6 mg by mouth 2 (two) times daily.  1  . ibuprofen (ADVIL,MOTRIN) 800 MG tablet Take 1 tablet (800 mg total) by mouth every 8 (eight)  hours as needed. (Patient not taking: Reported on 02/01/2014) 30 tablet 0   No current facility-administered medications for this visit.    Review of Systems  Respiratory: Positive for cough.   All other systems reviewed and are negative.  14 point ROS was done and is otherwise as detailed above or in HPI    PHYSICAL EXAMINATION: ECOG PERFORMANCE STATUS: 0 - Asymptomatic  Filed Vitals:   07/11/14 0802  BP: 103/48  Pulse: 87  Temp: 98.2 F (36.8 C)  Resp: 18   Filed Weights   07/11/14 0802  Weight: 113 lb 12.8 oz (51.619 kg)     Physical Exam  Constitutional: She is oriented to person, place, and time and well-developed, well-nourished, and in no distress.  HENT:  Head: Normocephalic and  atraumatic.  Nose: Nose normal.  Mouth/Throat: Oropharynx is clear and moist. No oropharyngeal exudate.  Eyes: Conjunctivae and EOM are normal. Pupils are equal, round, and reactive to light. Right eye exhibits no discharge. Left eye exhibits no discharge. No scleral icterus.  Neck: Normal range of motion. Neck supple. No tracheal deviation present. No thyromegaly present.  Cardiovascular: Normal rate, regular rhythm and normal heart sounds.  Exam reveals no gallop and no friction rub.   No murmur heard. Pulmonary/Chest: Effort normal and breath sounds normal. She has no wheezes. She has no rales.    Abdominal: Soft. Bowel sounds are normal. She exhibits no distension and no mass. There is no tenderness. There is no rebound and no guarding.  Musculoskeletal: Normal range of motion. She exhibits no edema.  Lymphadenopathy:    She has no cervical adenopathy.  Neurological: She is alert and oriented to person, place, and time. She has normal reflexes. No cranial nerve deficit. Gait normal. Coordination normal.  Skin: Skin is warm and dry. No rash noted.  Psychiatric: Mood, memory, affect and judgment normal.  Nursing note and vitals reviewed.   LABORATORY DATA:  I have reviewed the  data as listed Lab Results  Component Value Date   WBC 10.0 11/17/2012   HGB 12.6 11/17/2012   HCT 37.1 11/17/2012   MCV 95.1 11/17/2012   PLT 242 11/17/2012     RADIOGRAPHIC STUDIES: I have personally reviewed the radiological images as listed and agreed with the findings in the report. CLINICAL DATA: Cough since 06/01/2014, past history LEFT breast cancer post mastectomy 1982, endometrial cancer post hysterectomy November 2014  EXAM: CHEST 2 VIEW  COMPARISON: 01/29/2004  FINDINGS: Minimal enlargement of cardiac silhouette.  Mediastinal contours and pulmonary vascularity normal.  Atherosclerotic calcification aorta.  Multiple new pulmonary nodules consistent with pulmonary metastases.  Largest nodule is at RIGHT lower lobe 3.5 x 3.1 x 3.0 cm.  Underlying emphysematous and chronic bronchitic changes.  No infiltrate, pleural effusion, or pneumothorax.  Diffuse osseous demineralization.  Vascular calcifications in upper abdomen.  Additionally, few rounded calcifications are seen in the upper abdomen on the lateral view, cannot exclude cholelithiasis though these may be to posterior for the neck of the gallbladder.  IMPRESSION: COPD changes with multiple new pulmonary nodules consistent with pulmonary metastases.  Findings called to Dr. Nevada Crane on 07/05/2014 at 1228 hours.   Electronically Signed  By: Lavonia Dana M.D.  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  Biopsy will most likely need to be pursued.  Given her cancer history recurrent or new malignancy needs to be excluded.  It is reassuring that she has had no weight loss, appetite change and the remainder of her PE is normal.  It is also somewhat reassuring her cough is improving but I emphasized my concerns.  Given her history of RA, it is also certainly no impossible that these are rheumatoid nodules, but I explained to them that after her PET we will regroup and if necessary will schedule a biopsy.  Orders Placed This Encounter  Procedures  . NM PET Image  Initial (PI) Skull Base To Thigh    Standing Status: Future     Number of Occurrences: 1     Standing Expiration Date: 07/11/2015    Order Specific Question:  Reason for Exam (SYMPTOM  OR DIAGNOSIS REQUIRED)    Answer:  history of breast and endometrial cancer, pulmonary nodules    Order Specific Question:  Preferred imaging location?    Answer:  [facility]   The patient is very anxious today and I tried to reassure her as best as possible. I certainly understand her anxiety. We will work as quickly as possible to sort this out for her.  All questions were answered. The patient knows to call the clinic with any problems, questions or concerns.   This document serves as a record of services personally performed by Ancil Linsey, MD. It was created on her behalf by Janace Hoard, a trained medical scribe. The creation of this record is based on the scribe's personal observations and the provider's statements to them. This document has been checked and approved by the attending provider.  I have reviewed the above documentation for accuracy and completeness, and I agree with the above.  This note was electronically signed.    Kelby Fam. Whitney Muse, MD

## 2014-07-11 NOTE — Patient Instructions (Signed)
[place] at [facility] Discharge Instructions  RECOMMENDATIONS MADE BY THE CONSULTANT AND ANY TEST RESULTS WILL BE SENT TO YOUR REFERRING PHYSICIAN.  Exam and discussion by Dr. Whitney Muse Will get PET scan for further evaluation.  Nothing to eat or drink 6 hours prior.  Nothing with sugar in it.  No chewing gum or hard candy.  Follow-up after PET scan.  Thank you for choosing [place] at [facility] to provide your oncology and hematology care.  To afford each patient quality time with our provider, please arrive at least 15 minutes before your scheduled appointment time.    You need to re-schedule your appointment should you arrive 10 or more minutes late.  We strive to give you quality time with our providers, and arriving late affects you and other patients whose appointments are after yours.  Also, if you no show three or more times for appointments you may be dismissed from the clinic at the providers discretion.     Again, thank you for choosing [facility].  Our hope is that these requests will decrease the amount of time that you wait before being seen by our physicians.       _____________________________________________________________  Should you have questions after your visit to [facility], please contact our office at [phone] between the hours of 8:30 a.m. and 4:30 p.m.  Voicemails left after 4:30 p.m. will not be returned until the following business day.  For prescription refill requests, have your pharmacy contact our office.

## 2014-07-13 ENCOUNTER — Ambulatory Visit
Admission: RE | Admit: 2014-07-13 | Discharge: 2014-07-13 | Disposition: A | Discharge status: Home / Self Care | Payer: Medicare Other | Source: Ambulatory Visit | Attending: Hematology & Oncology | Admitting: Hematology & Oncology

## 2014-07-13 DIAGNOSIS — C50912 Malignant neoplasm of unspecified site of left female breast: Secondary | ICD-10-CM | POA: Insufficient documentation

## 2014-07-13 DIAGNOSIS — R918 Other nonspecific abnormal finding of lung field: Secondary | ICD-10-CM | POA: Diagnosis present

## 2014-07-13 DIAGNOSIS — C541 Malignant neoplasm of endometrium: Secondary | ICD-10-CM | POA: Diagnosis present

## 2014-07-13 LAB — GLUCOSE, CAPILLARY
GLUCOSE-CAPILLARY: 81 mg/dL (ref 65–99)
Glucose-Capillary: 81 mg/dL (ref 65–99)

## 2014-07-13 MED ORDER — FLUDEOXYGLUCOSE F - 18 (FDG) INJECTION
12.0000 | Freq: Once | INTRAVENOUS | Status: AC | PRN
  Administered 2014-07-13: 11.87 via INTRAVENOUS

## 2014-07-19 ENCOUNTER — Ambulatory Visit (HOSPITAL_COMMUNITY): Payer: 59 | Admitting: Hematology & Oncology

## 2014-07-20 ENCOUNTER — Encounter (HOSPITAL_BASED_OUTPATIENT_CLINIC_OR_DEPARTMENT_OTHER): Payer: Medicare Other | Admitting: Hematology & Oncology

## 2014-07-20 ENCOUNTER — Encounter (HOSPITAL_COMMUNITY): Payer: Self-pay | Admitting: Hematology & Oncology

## 2014-07-20 VITALS — BP 114/57 | HR 88 | Temp 98.0°F | Resp 18 | Wt 114.5 lb

## 2014-07-20 DIAGNOSIS — R918 Other nonspecific abnormal finding of lung field: Secondary | ICD-10-CM | POA: Diagnosis not present

## 2014-07-20 DIAGNOSIS — Z853 Personal history of malignant neoplasm of breast: Secondary | ICD-10-CM

## 2014-07-20 DIAGNOSIS — C541 Malignant neoplasm of endometrium: Secondary | ICD-10-CM

## 2014-07-20 DIAGNOSIS — Z8542 Personal history of malignant neoplasm of other parts of uterus: Secondary | ICD-10-CM | POA: Diagnosis not present

## 2014-07-20 NOTE — Patient Instructions (Addendum)
[place] at [facility] Discharge Instructions  RECOMMENDATIONS MADE BY THE CONSULTANT AND ANY TEST RESULTS WILL BE SENT TO YOUR REFERRING PHYSICIAN.  Exam and discussion by Dr. Whitney Muse. Will make a referral to Interventional Radiology for CT guided biopsy.  They will contact you with instructions and the date, time and location for the procedure. Will see you approximately 5 - 6 days after the biopsy to discuss results.   Thank you for choosing [place] at [facility] to provide your oncology and hematology care.  To afford each patient quality time with our provider, please arrive at least 15 minutes before your scheduled appointment time.    You need to re-schedule your appointment should you arrive 10 or more minutes late.  We strive to give you quality time with our providers, and arriving late affects you and other patients whose appointments are after yours.  Also, if you no show three or more times for appointments you may be dismissed from the clinic at the providers discretion.     Again, thank you for choosing [facility].  Our hope is that these requests will decrease the amount of time that you wait before being seen by our physicians.       _____________________________________________________________  Should you have questions after your visit to [facility], please contact our office at [phone] between the hours of 8:30 a.m. and 4:30 p.m.  Voicemails left after 4:30 p.m. will not be returned until the following business day.  For prescription refill requests, have your pharmacy contact our office.

## 2014-07-20 NOTE — Progress Notes (Signed)
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 female returns for follow-up of abnormal CXR . She has a history of both breast and endometrial carcinoma.   The patient is here with her family today and presents to discuss her PET scan results.  She currently has rheumatoid arthritis.  She states that some mornings and some nights she coughs up phlegm.  Her daughter says that the more she talks the more she coughs.  Overall however, she says her cough has improved.  The patient's appetite is well and she is very active.  She feels fine and travels often. She expresses concern for the imaging completed and whether or not results were found in any other part of her body.   MEDICAL HISTORY: Past Medical History  Diagnosis Date  . Cancer     breast  . Arthritis     Rheumatoid arthritis  . PONV (postoperative nausea and vomiting) 1982  . Breast cancer 09/1980    left unilateral mastectomy, no chemo or radiatioin  . Endometrial cancer 2014    has Endometrial ca and BRCA negative on her problem list.     is allergic to codeine; other; tuberculin ppd; and tylox.   SURGICAL HISTORY: Past Surgical History  Procedure Laterality Date  . Mastectomy  09/1980  . Tubal ligation    . Dilatation & currettage/hysteroscopy with resectocope N/A 11/18/2012    Procedure: DILATATION & CURETTAGE/HYSTEROSCOPY WITH RESECTOCOPE;   Surgeon: Marvene Staff, MD;  Location: [facility];  Service: Gynecology;  Laterality: N/A;  . Breast biopsy Left 09/11/80  . Vaginal hysterectomy  12/09/2012    SOCIAL HISTORY: History   Social History  . Marital Status: Married    Spouse Name: N/A  . Number of Children: 2  . Years of Education: N/A   Occupational History  . Not on file.   Social History Main Topics  . Smoking status: Never Smoker   . Smokeless tobacco: Never Used  . Alcohol Use: No  . Drug Use: No  . Sexual Activity: Not on file   Other Topics Concern  . Not on file   Social History Narrative    FAMILY HISTORY: Family History  Problem Relation Age of Onset  . Breast cancer Mother 74  . Heart attack Mother 7  . Colon cancer Father 80  . Heart attack Father 16  . Breast cancer Sister 20  . Heart attack Paternal Grandfather 41  . Breast cancer Other     mother's paternal first cousin dx in late 40s-early 40s    Review of Systems  Constitutional: Negative for fever, chills, weight loss and malaise/fatigue.  HENT: Negative for congestion, hearing loss, nosebleeds, sore throat and tinnitus.   Eyes: Negative for blurred vision, double vision, pain and discharge.  Respiratory: Positive for cough. Negative for hemoptysis, sputum production, shortness of breath and wheezing.   Cardiovascular: Negative for chest pain, palpitations, claudication, leg swelling and PND.  Gastrointestinal:  Negative for heartburn, nausea, vomiting, abdominal pain, diarrhea, constipation, blood in stool and melena.  Genitourinary: Negative for dysuria, urgency, frequency and hematuria.  Musculoskeletal: Negative for myalgias, joint pain and falls.  Skin: Negative for itching and rash.  Neurological: Negative for dizziness, tingling, tremors, sensory change, speech change, focal weakness, seizures, loss of consciousness, weakness and headaches.  Endo/Heme/Allergies: Does not bruise/bleed easily.  Psychiatric/Behavioral:  Negative for depression, suicidal ideas, memory loss and substance abuse. The patient is nervous/anxious. The patient does not have insomnia.     PHYSICAL EXAMINATION  ECOG PERFORMANCE STATUS: 0 - Asymptomatic  Filed Vitals:   07/20/14 0818  BP: 114/57  Pulse: 88  Temp: 98 F (36.7 C)  Resp: 18    Physical Exam  Constitutional: She is oriented to person, place, and time and well-developed, well-nourished, and in no distress.  HENT:  Head: Normocephalic and atraumatic.  Nose: Nose normal.  Mouth/Throat: Oropharynx is clear and moist. No oropharyngeal exudate.  Eyes: Conjunctivae and EOM are normal. Pupils are equal, round, and reactive to light. Right eye exhibits no discharge. Left eye exhibits no discharge. No scleral icterus.  Neck: Normal range of motion. Neck supple. No tracheal deviation present. No thyromegaly present.  Cardiovascular: Normal rate, regular rhythm and normal heart sounds.  Exam reveals no gallop and no friction rub.   No murmur heard. Pulmonary/Chest: Effort normal and breath sounds normal. She has no wheezes. She has no rales.  Abdominal: Soft. Bowel sounds are normal. She exhibits no distension and no mass. There is no tenderness. There is no rebound and no guarding.  Musculoskeletal: Normal range of motion. She exhibits no edema.  Lymphadenopathy:    She has no cervical adenopathy.  Neurological: She is alert and oriented to person, place, and time. She has normal reflexes. No cranial nerve deficit. Gait normal. Coordination normal.  Skin: Skin is warm and dry. No rash noted.  Psychiatric: Mood, memory, affect and judgment normal.  Nursing note and vitals reviewed.   LABORATORY DATA:  CBC    Component Value Date/Time   WBC 10.0 11/17/2012 1410   RBC 3.90 11/17/2012 1410   HGB 12.6 11/17/2012 1410   HCT 37.1 11/17/2012 1410   PLT 242 11/17/2012 1410   MCV 95.1 11/17/2012 1410   MCH 32.3 11/17/2012 1410   MCHC 34.0 11/17/2012 1410   RDW 13.5  11/17/2012 1410   CMP     Component Value Date/Time   NA 139 12/10/2011 0336   K 3.8 12/10/2011 0336   CL 103 12/10/2011 0336   GLUCOSE 119* 12/10/2011 0336   BUN 16 12/10/2011 0336   CREATININE 0.70 12/10/2011 0336     PENDING LABS:   RADIOGRAPHIC STUDIES: CLINICAL DATA: Initial treatment strategy for pulmonary nodules. Remote history of left breast cancer with more recent history of endometrial cancer.  EXAM: NUCLEAR MEDICINE PET SKULL BASE TO THIGH IMPRESSION: Multiple hypermetabolic pulmonary nodules and masses of varying size in the lungs bilaterally. Imaging features are compatible with metastatic disease.  No evidence for hypermetabolic metastatic disease in the neck, abdomen, or pelvis.   Electronically Signed  By: Misty Stanley M.D.  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 Evidence of disease outside of the chest. I advised the patient we will see if interventional radiology can obtain a TTNA. If not we can make either a referral to CT surgery or pulmonary.  We discussed the differential again which does include malignancy, rheumatoid nodules, infectious causes. Unfortunately I advised the patient that our primary concern at this point is ruling out recurrent malignancy. She and her family do understand that. She has a lot of anxiety related to her current situation. I will continue to try to reassure her as best as  possible.  We will regroup post biopsy to review her results and make additional recommendations at that time.  Orders Placed This Encounter  Procedures  . CT Biopsy    Standing Status: Future     Number of Occurrences: 1     Standing Expiration Date: 07/20/2015    Order Specific Question:  Lab orders requested (DO NOT place separate lab orders, these will be automatically ordered during procedure specimen collection):    Answer:  Surgical Pathology    Order Specific Question:  Reason for Exam (SYMPTOM  OR DIAGNOSIS REQUIRED)    Answer:  history breast ca, endometrial ca, rheumatoid arthritis with bilateral pulm nodules    Order Specific Question:  Preferred imaging location?    Answer:  [facility]    All questions were answered. The patient knows to call the clinic with any problems, questions or concerns. We can certainly see the patient much sooner if necessary.   This document serves as a record of services personally performed by Ancil Linsey, MD. It was created on her behalf by Janace Hoard, a trained medical scribe. The creation of this record is based on the scribe's personal observations and the provider's statements to them. This document has been checked and approved by the attending provider.  I have reviewed the above documentation for accuracy and completeness, and I agree with the above.  Kelby Fam. Whitney Muse, MD

## 2014-07-21 ENCOUNTER — Other Ambulatory Visit: Payer: Self-pay | Admitting: Radiology

## 2014-07-23 ENCOUNTER — Other Ambulatory Visit: Payer: Self-pay | Admitting: Radiology

## 2014-07-24 ENCOUNTER — Encounter (HOSPITAL_COMMUNITY): Payer: Self-pay

## 2014-07-24 ENCOUNTER — Ambulatory Visit (HOSPITAL_COMMUNITY)
Admission: RE | Admit: 2014-07-24 | Discharge: 2014-07-24 | Disposition: A | Discharge status: Home / Self Care | Payer: Medicare Other | Source: Ambulatory Visit | Attending: Hematology & Oncology | Admitting: Hematology & Oncology

## 2014-07-24 ENCOUNTER — Ambulatory Visit (HOSPITAL_COMMUNITY)
Admission: RE | Admit: 2014-07-24 | Discharge: 2014-07-24 | Disposition: A | Discharge status: Home / Self Care | Payer: Medicare Other | Source: Ambulatory Visit | Attending: Interventional Radiology | Admitting: Interventional Radiology

## 2014-07-24 DIAGNOSIS — Z803 Family history of malignant neoplasm of breast: Secondary | ICD-10-CM | POA: Insufficient documentation

## 2014-07-24 DIAGNOSIS — C3431 Malignant neoplasm of lower lobe, right bronchus or lung: Secondary | ICD-10-CM | POA: Diagnosis not present

## 2014-07-24 DIAGNOSIS — Z800 Family history of malignant neoplasm of digestive organs: Secondary | ICD-10-CM | POA: Diagnosis not present

## 2014-07-24 DIAGNOSIS — Z79899 Other long term (current) drug therapy: Secondary | ICD-10-CM | POA: Diagnosis not present

## 2014-07-24 DIAGNOSIS — Z8542 Personal history of malignant neoplasm of other parts of uterus: Secondary | ICD-10-CM | POA: Diagnosis not present

## 2014-07-24 DIAGNOSIS — Z79810 Long term (current) use of selective estrogen receptor modulators (SERMs): Secondary | ICD-10-CM | POA: Diagnosis not present

## 2014-07-24 DIAGNOSIS — M069 Rheumatoid arthritis, unspecified: Secondary | ICD-10-CM | POA: Insufficient documentation

## 2014-07-24 DIAGNOSIS — C541 Malignant neoplasm of endometrium: Secondary | ICD-10-CM

## 2014-07-24 DIAGNOSIS — R918 Other nonspecific abnormal finding of lung field: Secondary | ICD-10-CM | POA: Diagnosis present

## 2014-07-24 DIAGNOSIS — Z853 Personal history of malignant neoplasm of breast: Secondary | ICD-10-CM | POA: Diagnosis not present

## 2014-07-24 DIAGNOSIS — Z9889 Other specified postprocedural states: Secondary | ICD-10-CM

## 2014-07-24 LAB — CBC
HCT: 36.4 % (ref 36.0–46.0)
Hemoglobin: 12.4 g/dL (ref 12.0–15.0)
MCH: 33.8 pg (ref 26.0–34.0)
MCHC: 34.1 g/dL (ref 30.0–36.0)
MCV: 99.2 fL (ref 78.0–100.0)
PLATELETS: 228 10*3/uL (ref 150–400)
RBC: 3.67 MIL/uL — AB (ref 3.87–5.11)
RDW: 14.8 % (ref 11.5–15.5)
WBC: 7.2 10*3/uL (ref 4.0–10.5)

## 2014-07-24 LAB — PROTIME-INR
INR: 0.97 (ref 0.00–1.49)
PROTHROMBIN TIME: 13.1 s (ref 11.6–15.2)

## 2014-07-24 MED ORDER — LIDOCAINE HCL 1 % IJ SOLN
INTRAMUSCULAR | Status: AC
  Filled 2014-07-24: qty 20

## 2014-07-24 MED ORDER — MIDAZOLAM HCL 2 MG/2ML IJ SOLN
INTRAMUSCULAR | Status: AC
  Filled 2014-07-24: qty 2

## 2014-07-24 MED ORDER — FENTANYL CITRATE (PF) 100 MCG/2ML IJ SOLN
INTRAMUSCULAR | Status: AC | PRN
  Administered 2014-07-24: 50 ug via INTRAVENOUS

## 2014-07-24 MED ORDER — MIDAZOLAM HCL 2 MG/2ML IJ SOLN
INTRAMUSCULAR | Status: AC | PRN
  Administered 2014-07-24: 1 mg via INTRAVENOUS

## 2014-07-24 MED ORDER — SODIUM CHLORIDE 0.9 % IV SOLN: Freq: Once | INTRAVENOUS | Status: DC

## 2014-07-24 MED ORDER — FENTANYL CITRATE (PF) 100 MCG/2ML IJ SOLN
INTRAMUSCULAR | Status: AC
  Filled 2014-07-24: qty 2

## 2014-07-24 NOTE — Procedures (Signed)
RLL lung nodule Bx 18 g core times 3 No comp/EBL

## 2014-07-24 NOTE — Discharge Instructions (Signed)
Needle Biopsy of Lung, Care After °Refer to this sheet in the next few weeks. These instructions provide you with information on caring for yourself after your procedure. Your health care provider may also give you more specific instructions. Your treatment has been planned according to current medical practices, but problems sometimes occur. Call your health care provider if you have any problems or questions after your procedure. °WHAT TO EXPECT AFTER THE PROCEDURE °· A bandage will be applied over the area where the needle was inserted. You may be asked to apply pressure to the bandage for several minutes to ensure there is minimal bleeding. °· In most cases, you can leave when your needle biopsy procedure is completed. Do not drive yourself home. Someone else should take you home. °· If you received an IV sedative or general anesthetic, you will be taken to a comfortable place to relax while the medicine wears off. °· If you have upcoming travel scheduled, talk to your health care provider about when it is safe to travel by air after the procedure. °HOME CARE INSTRUCTIONS °· Expect to take it easy for the rest of the day. °· Protect the area where you received the needle biopsy by keeping the bandage in place for as long as instructed. °· You may feel some mild pain or discomfort in the area, but this should stop in a day or two. °· Take medicines only as directed by your health care provider. °SEEK MEDICAL CARE IF:  °· You have pain at the biopsy site that worsens or is not helped by medicine. °· You have swelling or drainage at the needle biopsy site. °· You have a fever. °SEEK IMMEDIATE MEDICAL CARE IF:  °· You have new or worsening shortness of breath. °· You have chest pain. °· You are coughing up blood. °· You have bleeding that does not stop with pressure or a bandage. °· You develop light-headedness or fainting. °Document Released: 10/19/2006 Document Revised: 05/08/2013 Document Reviewed:  05/16/2012 °ExitCare® Patient Information ©2015 [employer]. This information is not intended to replace advice given to you by your health care provider. Make sure you discuss any questions you have with your health care provider. ° °

## 2014-07-24 NOTE — H&P (Signed)
Chief Complaint: Patient was seen in consultation today for CT guided biopsy of a right lung nodule at the request of [name],[name]  Referring Physician(s): [name],[name]  History of Present Illness: Alejandra Callahan is a 69 y.o. female with history of breast cancer and endometrial cancer who developed a cough back in May of this year.   She states it started out as a cold, then it persisted and she thought it may be allergies given the time of year.  She was seen by her PCP and work up, which included chest xray, showed multiple pulmonary nodules, the largest at the right lower lobe.  She has never smoked cigarettes.  She denies shortness of breath with ambulation.  Past Medical History  Diagnosis Date  . Cancer     breast  . Arthritis     Rheumatoid arthritis  . PONV (postoperative nausea and vomiting) 1982  . Breast cancer 09/1980    left unilateral mastectomy, no chemo or radiatioin  . Endometrial cancer 2014    Past Surgical History  Procedure Laterality Date  . Mastectomy  09/1980  . Tubal ligation    . Dilatation & currettage/hysteroscopy with resectocope N/A 11/18/2012    Procedure: DILATATION & CURETTAGE/HYSTEROSCOPY WITH RESECTOCOPE;  Surgeon: Marvene Staff, MD;  Location: [facility];  Service: Gynecology;  Laterality: N/A;  . Breast biopsy Left 09/11/80  . Vaginal hysterectomy  12/09/2012    Allergies: Codeine; Other; Tuberculin ppd; and Tylox  Medications: Prior to Admission medications   Medication Sig Start Date End Date Taking? Authorizing Provider  acetaminophen (TYLENOL) 500 MG tablet Take 500 mg by mouth every 6 (six) hours as needed.   Yes Historical Provider, MD  alendronate (FOSAMAX) 10 MG tablet Take 70 mg by mouth once a week. Take with a full glass of water on an empty stomach.   Yes Historical Provider, MD  Calcium-Vitamin D (CALTRATE 600 PLUS-VIT D PO) Take 2 each by mouth daily.   Yes Historical Provider, MD  cholecalciferol  (VITAMIN D) 1000 UNITS tablet Take 1,000 Units by mouth daily.   Yes Historical Provider, MD  colchicine 0.6 MG tablet Take 0.6 mg by mouth 2 (two) times daily. 07/02/14  Yes Historical Provider, MD  folic acid (FOLVITE) 179 MCG tablet Take 800 mcg by mouth daily.    Yes Historical Provider, MD  hydroxychloroquine (PLAQUENIL) 200 MG tablet Take by mouth daily.   Yes Historical Provider, MD  ibuprofen (ADVIL,MOTRIN) 800 MG tablet Take 1 tablet (800 mg total) by mouth every 8 (eight) hours as needed. 11/18/12  Yes Servando Salina, MD  methotrexate (RHEUMATREX) 2.5 MG tablet Take 2.5 mg by mouth 2 (two) times a week. Caution:Chemotherapy. Protect from light.  Pt to take 4 tablets on Friday and 4 tablets on Saturday   Yes Historical Provider, MD  Multiple Vitamin (MULTIVITAMIN) tablet Take 1 tablet by mouth daily.   Yes Historical Provider, MD  raloxifene (EVISTA) 60 MG tablet Take 60 mg by mouth daily.   Yes Historical Provider, MD  predniSONE (DELTASONE) 5 MG tablet Take 5 mg by mouth daily with breakfast.    Historical Provider, MD     Family History  Problem Relation Age of Onset  . Breast cancer Mother 38  . Heart attack Mother 50  . Colon cancer Father 75  . Heart attack Father 85  . Breast cancer Sister 62  . Heart attack Paternal Grandfather 38  . Breast cancer Other  mother's paternal first cousin dx in late 40s-early 2s    History   Social History  . Marital Status: Married    Spouse Name: N/A  . Number of Children: 2  . Years of Education: N/A   Social History Main Topics  . Smoking status: Never Smoker   . Smokeless tobacco: Never Used  . Alcohol Use: No  . Drug Use: No  . Sexual Activity: Not on file   Other Topics Concern  . None   Social History Narrative     Review of Systems  Constitutional: Negative for fever, chills, activity change, appetite change and fatigue.  Respiratory: Positive for cough. Negative for chest tightness and shortness of  breath.   Cardiovascular: Negative for chest pain.  Gastrointestinal: Negative for nausea, vomiting and abdominal pain.  Musculoskeletal: Positive for arthralgias.  Skin: Negative.   Neurological: Negative.   Psychiatric/Behavioral: Negative.     Vital Signs: BP 106/46 mmHg  Pulse 72  Temp(Src) 97.7 F (36.5 C) (Oral)  Resp 18  Ht '4\' 11"'$  (1.499 m)  Wt 112 lb 5 oz (50.945 kg)  BMI 22.67 kg/m2  SpO2 100%  Physical Exam  Constitutional: She is oriented to person, place, and time. She appears well-developed and well-nourished.  HENT:  Head: Normocephalic and atraumatic.  Neck: Normal range of motion. Neck supple.  Cardiovascular: Normal rate, regular rhythm and normal heart sounds.   Pulmonary/Chest: Effort normal and breath sounds normal. She has no wheezes.  Abdominal: Soft. She exhibits no distension. There is no tenderness.  Musculoskeletal: Normal range of motion.  Neurological: She is alert and oriented to person, place, and time.  Skin: Skin is warm and dry.  Psychiatric: She has a normal mood and affect. Her behavior is normal. Judgment and thought content normal.  Vitals reviewed.   Mallampati Score:  MD Evaluation Airway: WNL Heart: WNL Abdomen: WNL Chest/ Lungs: WNL ASA  Classification: 3 Mallampati/Airway Score: One  Imaging: Dg Chest 2 View  07/05/2014   CLINICAL DATA:  Cough since 06/01/2014, past history LEFT breast cancer post mastectomy 1982, endometrial cancer post hysterectomy November 2014  EXAM: CHEST  2 VIEW  COMPARISON:  01/29/2004  FINDINGS: Minimal enlargement of cardiac silhouette.  Mediastinal contours and pulmonary vascularity normal.  Atherosclerotic calcification aorta.  Multiple new pulmonary nodules consistent with pulmonary metastases.  Largest nodule is at RIGHT lower lobe 3.5 x 3.1 x 3.0 cm.  Underlying emphysematous and chronic bronchitic changes.  No infiltrate, pleural effusion, or pneumothorax.  Diffuse osseous demineralization.   Vascular calcifications in upper abdomen.  Additionally, few rounded calcifications are seen in the upper abdomen on the lateral view, cannot exclude cholelithiasis though these may be to posterior for the neck of the gallbladder.  IMPRESSION: COPD changes with multiple new pulmonary nodules consistent with pulmonary metastases.  Findings called to Dr. Nevada Crane on 07/05/2014 at 1228 hours.   Electronically Signed   By: Lavonia Dana M.D.   On: 07/05/2014 12:29   Nm Pet Image Initial (pi) Skull Base To Thigh  07/13/2014   CLINICAL DATA:  Initial treatment strategy for pulmonary nodules. Remote history of left breast cancer with more recent history of endometrial cancer.  EXAM: NUCLEAR MEDICINE PET SKULL BASE TO THIGH  TECHNIQUE: 11.9 mCi F-18 FDG was injected intravenously. Full-ring PET imaging was performed from the skull base to thigh after the radiotracer. CT data was obtained and used for attenuation correction and anatomic localization.  FASTING BLOOD GLUCOSE:  Value: 81 mg/dl  COMPARISON:  Chest x-ray from 07/05/2014.  FINDINGS: NECK  No hypermetabolic lymph nodes in the neck.  CHEST  No hypermetabolic mediastinal or hilar nodes. Patient is status post left mastectomy. Surgical clips are noted in the left axilla.  Multiple bilateral pulmonary nodules of varying sizes are evident, some of which are cavitated. The largest nodule is in the right lower lobe (image 92 series 3) and measures 2.9 x 3.7 cm. This lesion is hypermetabolic with SUV max = 11.0. The other scattered pulmonary nodules range in size from 5 mm up to 2.3 cm and are hypermetabolic.  ABDOMEN/PELVIS  No abnormal hypermetabolic activity within the liver, pancreas, adrenal glands, or spleen. No hypermetabolic lymph nodes in the abdomen or pelvis.  There is a small amount of FDG accumulation in the proximal duodenum, likely physiologic.  2.5 cm water density lesion in the inferior liver is not hypermetabolic. This is compatible with a cyst. 12 mm ring  calcification posterior to the tail the pancreas may be related to a splenic artery aneurysm. There may be saccular aneurysms in the splenic artery in the hilar region of the spleen as well.  SKELETON  No focal hypermetabolic activity to suggest skeletal metastasis.  IMPRESSION: Multiple hypermetabolic pulmonary nodules and masses of varying size in the lungs bilaterally. Imaging features are compatible with metastatic disease.  No evidence for hypermetabolic metastatic disease in the neck, abdomen, or pelvis.   Electronically Signed   By: Misty Stanley M.D.   On: 07/13/2014 12:19    Labs:  CBC: No results for input(s): WBC, HGB, HCT, PLT in the last 8760 hours.  COAGS: No results for input(s): INR, APTT in the last 8760 hours.  BMP: No results for input(s): NA, K, CL, CO2, GLUCOSE, BUN, CALCIUM, CREATININE, GFRNONAA, GFRAA in the last 8760 hours.  Invalid input(s): CMP  LIVER FUNCTION TESTS: No results for input(s): BILITOT, AST, ALT, ALKPHOS, PROT, ALBUMIN in the last 8760 hours.  TUMOR MARKERS: No results for input(s): AFPTM, CEA, CA199, CHROMGRNA in the last 8760 hours.  Assessment and Plan:  Multiple pulmonary nodules worrisome for metastatic disease.  Will proceed with CT guided biopsy of the right lower lobe nodule today by Dr. Barbie Banner  Risks and Benefits discussed with the patient including, but not limited to bleeding, hemoptysis, respiratory failure requiring intubation, infection, pneumothorax requiring chest tube placement, stroke from air embolism or even death. All of the patient's questions were answered, patient is agreeable to proceed. Consent signed and in chart.  Thank you for this interesting consult.  I greatly enjoyed meeting Alejandra Callahan and look forward to participating in their care.  A copy of this report was sent to the requesting provider on this date.  Signed: Murrell Redden PA-C 07/24/2014, 8:10 AM   I spent a total of  30 Minutes  in face to face in  clinical consultation, greater than 50% of which was counseling/coordinating care for CT guided biopsy of right lung nodule.

## 2014-07-27 ENCOUNTER — Encounter (HOSPITAL_BASED_OUTPATIENT_CLINIC_OR_DEPARTMENT_OTHER): Payer: Medicare Other | Admitting: Hematology & Oncology

## 2014-07-27 ENCOUNTER — Encounter (HOSPITAL_COMMUNITY): Payer: Self-pay | Admitting: Hematology & Oncology

## 2014-07-27 VITALS — BP 96/46 | HR 94 | Temp 98.0°F | Resp 16 | Wt 114.6 lb

## 2014-07-27 DIAGNOSIS — R918 Other nonspecific abnormal finding of lung field: Secondary | ICD-10-CM

## 2014-07-27 DIAGNOSIS — Z79899 Other long term (current) drug therapy: Secondary | ICD-10-CM | POA: Diagnosis not present

## 2014-07-27 DIAGNOSIS — C541 Malignant neoplasm of endometrium: Secondary | ICD-10-CM | POA: Diagnosis present

## 2014-07-27 DIAGNOSIS — F418 Other specified anxiety disorders: Secondary | ICD-10-CM

## 2014-07-27 DIAGNOSIS — Z803 Family history of malignant neoplasm of breast: Secondary | ICD-10-CM

## 2014-07-27 DIAGNOSIS — Z853 Personal history of malignant neoplasm of breast: Secondary | ICD-10-CM

## 2014-07-27 DIAGNOSIS — C7801 Secondary malignant neoplasm of right lung: Secondary | ICD-10-CM | POA: Diagnosis not present

## 2014-07-27 DIAGNOSIS — M069 Rheumatoid arthritis, unspecified: Secondary | ICD-10-CM | POA: Diagnosis not present

## 2014-07-27 LAB — COMPREHENSIVE METABOLIC PANEL
ALBUMIN: 3.8 g/dL (ref 3.5–5.0)
ALK PHOS: 53 U/L (ref 38–126)
ALT: 21 U/L (ref 14–54)
AST: 26 U/L (ref 15–41)
Anion gap: 7 (ref 5–15)
BILIRUBIN TOTAL: 0.3 mg/dL (ref 0.3–1.2)
BUN: 16 mg/dL (ref 6–20)
CALCIUM: 9.3 mg/dL (ref 8.9–10.3)
CO2: 29 mmol/L (ref 22–32)
CREATININE: 0.61 mg/dL (ref 0.44–1.00)
Chloride: 100 mmol/L — ABNORMAL LOW (ref 101–111)
GFR calc non Af Amer: >60 mL/min (ref >60)
Glucose, Bld: 86 mg/dL (ref 65–99)
POTASSIUM: 3.7 mmol/L (ref 3.5–5.1)
SODIUM: 136 mmol/L (ref 135–145)
TOTAL PROTEIN: 6.7 g/dL (ref 6.5–8.1)

## 2014-07-27 NOTE — Patient Instructions (Signed)
[place] at [facility] Discharge Instructions  RECOMMENDATIONS MADE BY THE CONSULTANT AND ANY TEST RESULTS WILL BE SENT TO YOUR REFERRING PHYSICIAN.  Exam and discussion by Dr. Whitney Muse. Will check some blood work today. Want you to see Dr. Harrington Challenger - GYN oncology Will see you back after you see Dr. Harrington Challenger to further discuss treatment options.   Thank you for choosing [place] at [facility] to provide your oncology and hematology care.  To afford each patient quality time with our provider, please arrive at least 15 minutes before your scheduled appointment time.    You need to re-schedule your appointment should you arrive 10 or more minutes late.  We strive to give you quality time with our providers, and arriving late affects you and other patients whose appointments are after yours.  Also, if you no show three or more times for appointments you may be dismissed from the clinic at the providers discretion.     Again, thank you for choosing [facility].  Our hope is that these requests will decrease the amount of time that you wait before being seen by our physicians.       _____________________________________________________________  Should you have questions after your visit to [facility], please contact our office at [phone] between the hours of 8:30 a.m. and 4:30 p.m.  Voicemails left after 4:30 p.m. will not be returned until the following business day.  For prescription refill requests, have your pharmacy contact our office.

## 2014-07-27 NOTE — Progress Notes (Signed)
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 Final immunophenotype c/w endometrial   HISTORY OF PRESENTING ILLNESS:  Alejandra Callahan 69 y.o. female is here because of abnormal CXR with a history of breast and endometrial cancer. She presented to her primary care doctor with a new onset cough and upper respiratory infection. A chest x-ray revealed multiple pulmonary nodules. PET/CT showed hypermetabolism and given her cancer history she was referred for a biopsy. She is here today to review the results.  She has a great amount of anxiety. Throughout our conversation she had multiple "what if" questions.She is requesting also to meet with her prior gynecologic oncologist.      Endometrial ca   11/28/2012 Initial Diagnosis Endometrial ca, Embx Grade 2   12/09/2012 Surgery IAG3, close follow up     MEDICAL HISTORY:  Past Medical History  Diagnosis Date  . Cancer     breast  . Arthritis     Rheumatoid arthritis  . PONV (postoperative nausea and vomiting) 1982  . Breast cancer 09/1980    left unilateral mastectomy, no chemo or radiatioin  . Endometrial cancer 2014    SURGICAL HISTORY: Past Surgical History  Procedure Laterality Date  . Mastectomy  09/1980  . Tubal ligation    . Dilatation & currettage/hysteroscopy with resectocope N/A 11/18/2012    Procedure: DILATATION & CURETTAGE/HYSTEROSCOPY WITH RESECTOCOPE;  Surgeon: Marvene Staff,  MD;  Location: [facility];  Service: Gynecology;  Laterality: N/A;  . Breast biopsy Left 09/11/80  . Vaginal hysterectomy  12/09/2012    SOCIAL HISTORY: History   Social History  . Marital Status: Married    Spouse Name: N/A  . Number of Children: 2  . Years of Education: N/A   Occupational History  . Not on file.   Social History Main Topics  . Smoking status: Never Smoker   . Smokeless tobacco: Never Used  . Alcohol Use: No  . Drug Use: No  . Sexual Activity: Not on file   Other Topics Concern  . Not on file   Social History Narrative  Married, 58 years 2 grandchildren, 2 children Ex-teacher and Microbiologist Non-smoker ETOH, none  FAMILY HISTORY: Family History  Problem Relation Age of Onset  . Breast cancer Mother 21  . Heart attack Mother 90  . Colon cancer Father 48  . Heart attack Father 52  . Breast cancer Sister 64  . Heart attack Paternal Grandfather 47  . Breast cancer Other     mother's paternal first cousin dx in late 40s-early 65s   indicated that her mother is deceased. She indicated that her father is deceased. She indicated that both of her sisters are alive. She indicated that her maternal grandmother is deceased. She indicated that her maternal grandfather is deceased. She indicated that her paternal grandmother is deceased. She indicated that her paternal grandfather is deceased. She indicated that her other is deceased.  Mother deceased at 76, heart attack Father deceased at 62, congested heart failure 2 sisters, one with breast cancer  ALLERGIES:  is allergic to codeine; other; tuberculin ppd; and tylox.  MEDICATIONS:  Current Outpatient Prescriptions  Medication Sig Dispense Refill  . acetaminophen (TYLENOL) 500 MG tablet Take 500 mg by mouth every 6 (six) hours as needed.    Marland Kitchen alendronate (FOSAMAX) 10 MG tablet Take 70 mg by mouth once a week. Take with a full glass of water on an empty stomach.    . Calcium-Vitamin D (CALTRATE 600  PLUS-VIT D PO) Take 2 each by mouth daily.    . cholecalciferol (VITAMIN D) 1000 UNITS tablet Take 1,000 Units by mouth daily.    . folic acid (FOLVITE) 056 MCG tablet Take 800 mcg by mouth daily.     . hydroxychloroquine (PLAQUENIL) 200 MG tablet Take by mouth daily.    . methotrexate (RHEUMATREX) 2.5 MG tablet Take 2.5 mg by mouth 2 (two) times a week. Caution:Chemotherapy. Protect from light.  Pt to take 4 tablets on Friday and 4 tablets on Saturday    . Multiple Vitamin (MULTIVITAMIN) tablet Take 1 tablet by mouth daily.    . predniSONE (DELTASONE) 5 MG tablet Take 5 mg by mouth daily with breakfast. Only takes when she has flares up    . raloxifene (EVISTA) 60 MG tablet Take 60 mg by mouth daily.    . colchicine 0.6 MG tablet Take 0.6 mg by mouth 2 (two) times daily.  1   No current facility-administered medications for this visit.    Review of Systems  Respiratory: Positive for cough.   Psychiatric: Positive for anxiety All other systems reviewed and are negative.  14 point ROS was done and is otherwise as detailed above or in HPI  PHYSICAL EXAMINATION: ECOG PERFORMANCE STATUS: 0 - Asymptomatic  Filed Vitals:   07/27/14 0800  BP: 96/46  Pulse: 94  Temp: 98 F (36.7 C)  Resp: 16   Filed Weights   07/27/14 0800  Weight: 114 lb 9.6 oz (51.982 kg)    Physical Exam  Constitutional: She is oriented to person, place, and time and well-developed, well-nourished, and in no distress.  HENT:  Head: Normocephalic and atraumatic.  Nose: Nose normal.  Mouth/Throat: Oropharynx is clear and moist. No oropharyngeal exudate.  Eyes: Conjunctivae and EOM are normal. Pupils are equal, round, and reactive to light. Right eye exhibits no discharge. Left eye exhibits no discharge. No scleral icterus.  Neck: Normal range of motion. Neck supple. No tracheal deviation present. No thyromegaly present.  Cardiovascular: Normal rate, regular rhythm and normal heart sounds.  Exam reveals no  gallop and no friction rub.   No murmur heard. Pulmonary/Chest: Effort normal and breath sounds normal. She has no wheezes. She has no rales.  Abdominal: Soft. Bowel sounds are normal. She exhibits no distension and no mass. There is no tenderness. There is no rebound and no guarding.  Musculoskeletal: Normal range of motion. She exhibits no edema.  Lymphadenopathy:    She has no cervical adenopathy.  Neurological: She is alert and oriented to person, place, and time. She has normal reflexes. No cranial nerve deficit. Gait normal. Coordination normal.  Skin: Skin is warm and dry. No rash noted.  Psychiatric: Mood, memory, affect and judgment normal.  Nursing note and vitals reviewed.   LABORATORY DATA:  I have reviewed the data as listed Lab Results  Component Value Date   WBC 7.2 07/24/2014   HGB 12.4 07/24/2014   HCT 36.4 07/24/2014   MCV 99.2 07/24/2014   PLT 228 07/24/2014  RADIOGRAPHIC STUDIES: CLINICAL DATA: Initial treatment strategy for pulmonary nodules. Remote history of left breast cancer with more recent history of endometrial cancer.  EXAM: NUCLEAR MEDICINE PET SKULL BASE TO THIGH IMPRESSION: Multiple hypermetabolic pulmonary nodules and masses of varying size in the lungs bilaterally. Imaging features are compatible with metastatic disease.  No evidence for hypermetabolic metastatic disease in the neck, abdomen, or pelvis.   Electronically Signed  By: Misty Stanley M.D.  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 She has a lot of questions about how her endometrial cancer could recur given that it was stage I at diagnosis. She had questions as to whether or not receiving radiation after her surgery would have prevented this recurrence. I spent a great amount of time discussing with the patient that all of her care was appropriate for her stage of disease. I again reminded her that with any cancer diagnosis cure rate is rarely 100%. That for most cancers even with early stage disease there is a recurrence risk.  Given the grade of her malignancy and the large volume of her pulmonary disease I have recommended therapy with carboplatin and Taxol front line. We will send her back to gynecologic oncology for an opinion as well. I will plan on seeing her back next week for further discussion. We briefly addressed port placement. He also briefly addressed chemotherapy but I advised her we will discuss this more at follow-up and if that is the recommended treatment she will need formal teaching.  IThe patient is very anxious today and I tried to reassure her as best as possible. I certainly understand her anxiety.   All questions were answered. The patient knows to call the clinic with any problems, questions or concerns.   This document serves as a record of services personally performed by Ancil Linsey, MD. It was created on her behalf by Janace Hoard, a trained medical scribe. The creation of this record is based on the scribe's personal observations and the provider's statements to them. This document has been checked and approved by the attending provider.  I have reviewed the above documentation for accuracy and completeness, and I agree with the above.  This note was electronically signed.    Kelby Fam. Whitney Muse, MD

## 2014-07-28 LAB — CA 125: CA 125: 25 U/mL (ref 0.0–38.1)

## 2014-07-30 ENCOUNTER — Ambulatory Visit: Payer: Medicare Other | Attending: Gynecologic Oncology | Admitting: Gynecologic Oncology

## 2014-07-30 ENCOUNTER — Encounter: Payer: Self-pay | Admitting: Gynecologic Oncology

## 2014-07-30 VITALS — BP 112/54 | HR 81 | Temp 97.8°F | Resp 16 | Ht 59.0 in | Wt 112.4 lb

## 2014-07-30 DIAGNOSIS — C541 Malignant neoplasm of endometrium: Secondary | ICD-10-CM

## 2014-07-30 NOTE — Progress Notes (Signed)
Consult Note: Gyn-Onc  Alejandra Callahan 69 y.o. female  CC:  Chief Complaint  Patient presents with  . endometrial cancer    followup   Assessment/Plan: Alejandra Callahan 69 y.o. female with recurrent stage IA grade 3 endometrioid adenocarcinoma status post surgery 12/09/2012, with diagnosis of pulmonary metastases in July, 2016. She is seeing Dr Whitney Muse for chemotherapy. Recommend carboplatin and paclitaxel - at least 6 cycles.  CA 125 is not an obvious marker for her (upper normal range at present at 25U/mL) however, I would recommend following it with each cycle.  Consider reimaging chest after 3 cycles to evaluate for response to therapy.  We will see her back after she has achieved complete response in 6 cycles, or if she is failing to respond to current treatment recommendations in 3 cycles.  HPI: HISTORY: Alejandra Callahan is a 69 y.o. woman who is seen in consultation at the request of Dr. 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 The patient has a history of left breast cancer in 1982. She was treated with radical mastectomy with lymphadenectomy. She did not undergo chemotherapy, radiation, or hormonal therapy. She is experiencing postmenopausal spotting over the last several months and had a normal endometrial biopsy. An endometrial curettage was performed in the operating room which revealed a grade 2 endometrioid adenocarcinoma with squamous differentiation.   The patient has no current complaints. 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 This gene may have a propensity for increased risk of breast and ovarian cancer. She underwent testing secondary to a strong family history and personal history of early breast cancer. She recently had repeat testing that revealed negative genetic testing. Previous testing through [facility] revealed a BARD1 VUS. This was not found on this test, indicating that the BARD1 VUS found  at [facility] is most likely thought to be benign through [facility]. 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   Oncology Summary:    Endometrial cancer    11/28/2012  Initial Diagnosis  PMB --> EMB: Grade 2 EC    12/09/2012  Surgery  RATLH/BSO/PPALND with SLN per [facility] protocol.   Surgical Findings:  Normal appearing uterus, tubes and ovaries. Thin adhesion between colon and left adnexa. Normal appearing pelvic and PA nodes. No evidence of metastatic disease. One sentinel node in left obturator space.  Pathology:  Diagnosis: A: Sentinel lymph node #1, left obturator, excision - Fibrofatty soft tissue, no lymph node identified, no tumor seen   B: Colon, epiploic nodule, excision  - Calcified fibrous tissue with foreign body giant cell reaction, no tumor seen  C: Lymph nodes, left periaortic, regional node excision  - One lymph node with no evidence of carcinoma (0/1)  D: Lymph nodes, right periaortic, regional node excision  - Two lymph nodes with no evidence of carcinoma (0/2)  E: Lymph nodes, left pelvic, regional excision  - Three lymph node with no evidence of carcinoma (0/3)  F: Lymph nodes, right pelvic, regional node excision  - Four lymph nodes with no evidence of carcinoma (0/4)  G: Uterus, cervix, bilateral tubes and ovaries, hysterectomy and bilateral salpingo-oophorectomy   Histologic type: Endometrioid adenocarcinoma with squamous differentiation (G7)  Histologic grade: FIGO grade 3 (architecture grade 2, nuclear grade 3)  Tumor site: Endometrium  Tumor size: 5.2 cm (gross)  Myometrial invasion: Inner half Depth: 4 mm Wall thickness: 13 mm Percent: 31% (G7)  Serosal involvement: Not identified  Lower uterine segment involvement: Not identified  Cervical involvement: Not identified  Adnexal involvement: Not identified  Other involved sites: N/A  Cervical/vaginal margin and distance: Widely free   Lymphovascular space invasion: Not  identified  Right ovary: Atrophic ovary with no specific pathologic abnormality, no tumor seen Left ovary: Atrophic ovary with no specific pathologic abnormality, no tumor seen  Right fallopian tube: No specific pathologic abnormality, no tumor seen Left fallopian tube: No specific pathologic abnormality, no tumor seen  She was staged as a IA grade 3 endoemetrial cancer with low risk features and dispositioned to close followup.   Interval History:  In late June, 2016 she developed a persistent cough. She was seen by her PCP who initially felt these symptoms were related to allergies. When they persisted despite zyrtec prescription, she underwent a CXR imaging. This revealed multiple pulmonary metastases. PET/CT was performed on July 8th, 2016 and revealed multiple pulmonary metastases (largest in the right LL measuring 3.7cm). No other sites of metastatic disease were identified. On July 19th, 2016 she underwent CT guided biopsy of the chest lesion on the right and this returned positive for adenocarcinoma with IHC profile consistent with her prior endometrial cancer (cytokeratin 7, ER, PR, p16, WT-1 positive).   Review of Systems  Constitutional: Denies fever. Skin: No rash Cardiovascular: No chest pain, shortness of breath Pulmonary: + cough, no wheeze.  Gastro Intestinal: No nausea, vomiting, constipation, or diarrhea reported. No change in bowel movement.  Genitourinary: No frequency, urgency, or dysuria.  Denies vaginal bleeding and discharge.  Musculoskeletal: No myalgia, arthralgia, joint swelling or pain.  Neurologic: No weakness, numbness, or change in gait.  Psychology: No changes   Current Meds:  Outpatient Encounter Prescriptions as of 07/30/2014  Medication Sig  . acetaminophen (TYLENOL) 500 MG tablet Take 500 mg by mouth every 6 (six) hours as needed.  Marland Kitchen alendronate (FOSAMAX) 10 MG tablet Take 70 mg by mouth once a week. Take with a full glass of water on an empty  stomach.  . Calcium-Vitamin D (CALTRATE 600 PLUS-VIT D PO) Take 2 each by mouth daily.  . cholecalciferol (VITAMIN D) 1000 UNITS tablet Take 1,000 Units by mouth daily.  . folic acid (FOLVITE) 202 MCG tablet Take 800 mcg by mouth daily.   . hydroxychloroquine (PLAQUENIL) 200 MG tablet Take by mouth daily.  . methotrexate (RHEUMATREX) 2.5 MG tablet Take 2.5 mg by mouth 2 (two) times a week. Caution:Chemotherapy. Protect from light.  Pt to take 4 tablets on Friday and 4 tablets on Saturday  . Multiple Vitamin (MULTIVITAMIN) tablet Take 1 tablet by mouth daily.  . predniSONE (DELTASONE) 5 MG tablet Take 5 mg by mouth daily with breakfast. Only takes when she has flares up  . raloxifene (EVISTA) 60 MG tablet Take 60 mg by mouth daily.  . [DISCONTINUED] colchicine 0.6 MG tablet Take 0.6 mg by mouth 2 (two) times daily.   No facility-administered encounter medications on file as of 07/30/2014.    Allergy:  Allergies  Allergen Reactions  . Codeine Other (See Comments)    Blurred vision and dizziness blurred vision, dizzy, loss of hearing  . Other     TB test- arm swells/fever to extremity  . Tuberculin Ppd Other (See Comments)    Swelling and redness in arm  . Tylox [Oxycodone-Acetaminophen] Other (See Comments)    Blurred vision and dizziness Blurred vision, dizziness    Social Hx:   History   Social History  .  Marital Status: Married    Spouse Name: N/A  . Number of Children: 2  . Years of Education: N/A   Occupational History  . Not on file.   Social History Main Topics  . Smoking status: Never Smoker   . Smokeless tobacco: Never Used  . Alcohol Use: No  . Drug Use: No  . Sexual Activity: Not on file   Other Topics Concern  . Not on file   Social History Narrative    Past Surgical Hx:  Past Surgical History  Procedure Laterality Date  . Mastectomy Left 09/1980  . Tubal ligation  April 1982  . Dilatation & currettage/hysteroscopy with resectocope N/A 11/18/2012     Procedure: DILATATION & CURETTAGE/HYSTEROSCOPY WITH RESECTOCOPE;  Surgeon: Marvene Staff, MD;  Location: [facility];  Service: Gynecology;  Laterality: N/A;  . Breast biopsy Left 09/11/80  . Vaginal hysterectomy  12/09/2012    Complete hysterectomy [facility]    Past Medical Hx:  Past Medical History  Diagnosis Date  . Cancer     breast  . Arthritis     Rheumatoid arthritis  . PONV (postoperative nausea and vomiting) 1982  . Breast cancer 09/1980    left unilateral mastectomy, no chemo or radiatioin  . Endometrial cancer 2014    Oncology Hx:    Endometrial ca   11/28/2012 Initial Diagnosis Endometrial ca, Embx Grade 2   12/09/2012 Surgery IAG3, close follow up    Family Hx:  Family History  Problem Relation Age of Onset  . Breast cancer Mother 47  . Heart attack Mother 77  . Colon cancer Father 35  . Heart attack Father 39  . Breast cancer Sister 70  . Heart attack Paternal Grandfather 24  . Breast cancer Other     mother's paternal first cousin dx in late 40s-early 63s    Vitals:  Blood pressure 112/54, pulse 81, temperature 97.8 F (36.6 C), temperature source Oral, resp. rate 16, height _0  (1.499 m), weight 112 lb 6.4 oz (50.984 kg).  Physical Exam: Well-nourished well-developed female in no acute distress.  Neck: Supple, no lymphadenopathy no thyromegaly.  Lungs: Clear to auscultation bilaterally.  Cardiovascular: Regular rate and rhythm.  Abdomen: Well-healed surgical incisions. Abdomen is soft, nontender, nondistended. There are no palpable masses or hepatosplenomegaly.  Groins: No lymphadenopathy.  Extremity: No edema. + varicose veins left  Pelvic: deferred  Donaciano Eva, MD 07/30/2014, 5:36 PM

## 2014-07-30 NOTE — Patient Instructions (Signed)
Plan to follow up with Dr. Denman George or Dr. Alycia Rossetti after three cycles of chemotherapy.  Dr. Serita Grit recommends having a CA 125 checked with each cycle of chemotherapy.  Please call for any questions or concerns.  Carboplatin injection What is this medicine? CARBOPLATIN (KAR boe pla tin) is a chemotherapy drug. It targets fast dividing cells, like cancer cells, and causes these cells to die. This medicine is used to treat ovarian cancer and many other cancers. This medicine may be used for other purposes; ask your health care provider or pharmacist if you have questions. COMMON BRAND NAME(S): Paraplatin What should I tell my health care provider before I take this medicine? They need to know if you have any of these conditions: -blood disorders -hearing problems -kidney disease -recent or ongoing radiation therapy -an unusual or allergic reaction to carboplatin, cisplatin, other chemotherapy, other medicines, foods, dyes, or preservatives -pregnant or trying to get pregnant -breast-feeding How should I use this medicine? This drug is usually given as an infusion into a vein. It is administered in a hospital or clinic by a specially trained health care professional. Talk to your pediatrician regarding the use of this medicine in children. Special care may be needed. Overdosage: If you think you have taken too much of this medicine contact a poison control center or emergency room at once. NOTE: This medicine is only for you. Do not share this medicine with others. What if I miss a dose? It is important not to miss a dose. Call your doctor or health care professional if you are unable to keep an appointment. What may interact with this medicine? -medicines for seizures -medicines to increase blood counts like filgrastim, pegfilgrastim, sargramostim -some antibiotics like amikacin, gentamicin, neomycin, streptomycin, tobramycin -vaccines Talk to your doctor or health care professional before  taking any of these medicines: -acetaminophen -aspirin -ibuprofen -ketoprofen -naproxen This list may not describe all possible interactions. Give your health care provider a list of all the medicines, herbs, non-prescription drugs, or dietary supplements you use. Also tell them if you smoke, drink alcohol, or use illegal drugs. Some items may interact with your medicine. What should I watch for while using this medicine? Your condition will be monitored carefully while you are receiving this medicine. You will need important blood work done while you are taking this medicine. This drug may make you feel generally unwell. This is not uncommon, as chemotherapy can affect healthy cells as well as cancer cells. Report any side effects. Continue your course of treatment even though you feel ill unless your doctor tells you to stop. In some cases, you may be given additional medicines to help with side effects. Follow all directions for their use. Call your doctor or health care professional for advice if you get a fever, chills or sore throat, or other symptoms of a cold or flu. Do not treat yourself. This drug decreases your body's ability to fight infections. Try to avoid being around people who are sick. This medicine may increase your risk to bruise or bleed. Call your doctor or health care professional if you notice any unusual bleeding. Be careful brushing and flossing your teeth or using a toothpick because you may get an infection or bleed more easily. If you have any dental work done, tell your dentist you are receiving this medicine. Avoid taking products that contain aspirin, acetaminophen, ibuprofen, naproxen, or ketoprofen unless instructed by your doctor. These medicines may hide a fever. Do not become pregnant while taking  this medicine. Women should inform their doctor if they wish to become pregnant or think they might be pregnant. There is a potential for serious side effects to an  unborn child. Talk to your health care professional or pharmacist for more information. Do not breast-feed an infant while taking this medicine. What side effects may I notice from receiving this medicine? Side effects that you should report to your doctor or health care professional as soon as possible: -allergic reactions like skin rash, itching or hives, swelling of the face, lips, or tongue -signs of infection - fever or chills, cough, sore throat, pain or difficulty passing urine -signs of decreased platelets or bleeding - bruising, pinpoint red spots on the skin, black, tarry stools, nosebleeds -signs of decreased red blood cells - unusually weak or tired, fainting spells, lightheadedness -breathing problems -changes in hearing -changes in vision -chest pain -high blood pressure -low blood counts - This drug may decrease the number of white blood cells, red blood cells and platelets. You may be at increased risk for infections and bleeding. -nausea and vomiting -pain, swelling, redness or irritation at the injection site -pain, tingling, numbness in the hands or feet -problems with balance, talking, walking -trouble passing urine or change in the amount of urine Side effects that usually do not require medical attention (report to your doctor or health care professional if they continue or are bothersome): -hair loss -loss of appetite -metallic taste in the mouth or changes in taste This list may not describe all possible side effects. Call your doctor for medical advice about side effects. You may report side effects to FDA at 1-800-FDA-1088. Where should I keep my medicine? This drug is given in a hospital or clinic and will not be stored at home. NOTE: This sheet is a summary. It may not cover all possible information. If you have questions about this medicine, talk to your doctor, pharmacist, or health care provider.  2015, Elsevier/Gold Standard. (2007-03-29  14:38:05)  Paclitaxel injection What is this medicine? PACLITAXEL (PAK li TAX el) is a chemotherapy drug. It targets fast dividing cells, like cancer cells, and causes these cells to die. This medicine is used to treat ovarian cancer, breast cancer, and other cancers. This medicine may be used for other purposes; ask your health care provider or pharmacist if you have questions. COMMON BRAND NAME(S): Onxol, Taxol What should I tell my health care provider before I take this medicine? They need to know if you have any of these conditions: -blood disorders -irregular heartbeat -infection (especially a virus infection such as chickenpox, cold sores, or herpes) -liver disease -previous or ongoing radiation therapy -an unusual or allergic reaction to paclitaxel, alcohol, polyoxyethylated castor oil, other chemotherapy agents, other medicines, foods, dyes, or preservatives -pregnant or trying to get pregnant -breast-feeding How should I use this medicine? This drug is given as an infusion into a vein. It is administered in a hospital or clinic by a specially trained health care professional. Talk to your pediatrician regarding the use of this medicine in children. Special care may be needed. Overdosage: If you think you have taken too much of this medicine contact a poison control center or emergency room at once. NOTE: This medicine is only for you. Do not share this medicine with others. What if I miss a dose? It is important not to miss your dose. Call your doctor or health care professional if you are unable to keep an appointment. What may interact with this medicine?  Do not take this medicine with any of the following medications: -disulfiram -metronidazole This medicine may also interact with the following medications: -cyclosporine -diazepam -ketoconazole -medicines to increase blood counts like filgrastim, pegfilgrastim, sargramostim -other chemotherapy drugs like cisplatin,  doxorubicin, epirubicin, etoposide, teniposide, vincristine -quinidine -testosterone -vaccines -verapamil Talk to your doctor or health care professional before taking any of these medicines: -acetaminophen -aspirin -ibuprofen -ketoprofen -naproxen This list may not describe all possible interactions. Give your health care provider a list of all the medicines, herbs, non-prescription drugs, or dietary supplements you use. Also tell them if you smoke, drink alcohol, or use illegal drugs. Some items may interact with your medicine. What should I watch for while using this medicine? Your condition will be monitored carefully while you are receiving this medicine. You will need important blood work done while you are taking this medicine. This drug may make you feel generally unwell. This is not uncommon, as chemotherapy can affect healthy cells as well as cancer cells. Report any side effects. Continue your course of treatment even though you feel ill unless your doctor tells you to stop. In some cases, you may be given additional medicines to help with side effects. Follow all directions for their use. Call your doctor or health care professional for advice if you get a fever, chills or sore throat, or other symptoms of a cold or flu. Do not treat yourself. This drug decreases your body's ability to fight infections. Try to avoid being around people who are sick. This medicine may increase your risk to bruise or bleed. Call your doctor or health care professional if you notice any unusual bleeding. Be careful brushing and flossing your teeth or using a toothpick because you may get an infection or bleed more easily. If you have any dental work done, tell your dentist you are receiving this medicine. Avoid taking products that contain aspirin, acetaminophen, ibuprofen, naproxen, or ketoprofen unless instructed by your doctor. These medicines may hide a fever. Do not become pregnant while taking this  medicine. Women should inform their doctor if they wish to become pregnant or think they might be pregnant. There is a potential for serious side effects to an unborn child. Talk to your health care professional or pharmacist for more information. Do not breast-feed an infant while taking this medicine. Men are advised not to father a child while receiving this medicine. What side effects may I notice from receiving this medicine? Side effects that you should report to your doctor or health care professional as soon as possible: -allergic reactions like skin rash, itching or hives, swelling of the face, lips, or tongue -low blood counts - This drug may decrease the number of white blood cells, red blood cells and platelets. You may be at increased risk for infections and bleeding. -signs of infection - fever or chills, cough, sore throat, pain or difficulty passing urine -signs of decreased platelets or bleeding - bruising, pinpoint red spots on the skin, black, tarry stools, nosebleeds -signs of decreased red blood cells - unusually weak or tired, fainting spells, lightheadedness -breathing problems -chest pain -high or low blood pressure -mouth sores -nausea and vomiting -pain, swelling, redness or irritation at the injection site -pain, tingling, numbness in the hands or feet -slow or irregular heartbeat -swelling of the ankle, feet, hands Side effects that usually do not require medical attention (report to your doctor or health care professional if they continue or are bothersome): -bone pain -complete hair loss including hair  on your head, underarms, pubic hair, eyebrows, and eyelashes -changes in the color of fingernails -diarrhea -loosening of the fingernails -loss of appetite -muscle or joint pain -red flush to skin -sweating This list may not describe all possible side effects. Call your doctor for medical advice about side effects. You may report side effects to FDA at  1-800-FDA-1088. Where should I keep my medicine? This drug is given in a hospital or clinic and will not be stored at home. NOTE: This sheet is a summary. It may not cover all possible information. If you have questions about this medicine, talk to your doctor, pharmacist, or health care provider.  2015, Elsevier/Gold Standard. (2012-02-15 16:41:21)

## 2014-07-31 ENCOUNTER — Encounter (HOSPITAL_COMMUNITY): Payer: Self-pay | Admitting: Hematology & Oncology

## 2014-07-31 ENCOUNTER — Encounter (HOSPITAL_BASED_OUTPATIENT_CLINIC_OR_DEPARTMENT_OTHER): Payer: Medicare Other | Admitting: Hematology & Oncology

## 2014-07-31 ENCOUNTER — Encounter: Payer: Self-pay | Admitting: *Deleted

## 2014-07-31 VITALS — BP 107/45 | HR 79 | Temp 98.2°F | Resp 16 | Wt 113.7 lb

## 2014-07-31 DIAGNOSIS — C7801 Secondary malignant neoplasm of right lung: Secondary | ICD-10-CM

## 2014-07-31 DIAGNOSIS — C541 Malignant neoplasm of endometrium: Secondary | ICD-10-CM

## 2014-07-31 DIAGNOSIS — R05 Cough: Secondary | ICD-10-CM

## 2014-07-31 DIAGNOSIS — R918 Other nonspecific abnormal finding of lung field: Secondary | ICD-10-CM

## 2014-07-31 DIAGNOSIS — Z23 Encounter for immunization: Secondary | ICD-10-CM | POA: Diagnosis not present

## 2014-07-31 DIAGNOSIS — F418 Other specified anxiety disorders: Secondary | ICD-10-CM

## 2014-07-31 MED ORDER — ACYCLOVIR 400 MG PO TABS: 400.0000 mg | ORAL_TABLET | Freq: Two times a day (BID) | ORAL | Status: DC

## 2014-07-31 MED ORDER — PNEUMOCOCCAL VAC POLYVALENT 25 MCG/0.5ML IJ INJ
0.5000 mL | INJECTION | Freq: Once | INTRAMUSCULAR | Status: AC
  Administered 2014-07-31: 0.5 mL via INTRAMUSCULAR
  Filled 2014-07-31: qty 0.5

## 2014-07-31 NOTE — Progress Notes (Signed)
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 Final immunophenotype c/w endometrial   HISTORY OF PRESENTING ILLNESS:  DOMINIGUE Callahan 69 y.o. female is here because of abnormal CXR with a history of breast and endometrial cancer.She presented to her primary care doctor with a new onset cough and upper respiratory infection. A chest x-ray revealed multiple pulmonary nodules. PET/CT showed hypermetabolism and given her cancer history she was referred for a biopsy. Biopsy unfortunately showed recurrence of her endometrial cancer.  The patient's sisters surprised her and came to see her over the weekend.  She states that this was her "medicine" and this made her feel better.  She also has a lot of support from church.  Though she is still showing anxiety, she has been reading books and trying to be positive about her condition.  She complains of experiencing soreness in her left shoulder blade in the morning when she awakens in the morning. She is afraid the cancer has spread.  She met with Dr. Denman George at [facility] and is ready and willing to proceed with chemotherapy.  She has a lot of concerns about diet, wearing a mask while she is out in public, and attending church. She is very concerned about getting shingles and has not had the shingles vaccine. She would like to be on something to prevent them. She has never had a "pneumonia vaccine."     Endometrial ca    11/28/2012 Initial Diagnosis Endometrial ca, Embx Grade 2   12/09/2012 Surgery IAG3, close follow up     MEDICAL HISTORY:  Past Medical History  Diagnosis Date  . Cancer     breast  . Arthritis     Rheumatoid arthritis  . PONV (postoperative nausea and vomiting) 1982  . Breast cancer 09/1980    left unilateral mastectomy, no chemo or radiatioin  . Endometrial cancer 2014    SURGICAL HISTORY: Past Surgical History  Procedure Laterality Date  . Mastectomy Left 09/1980  . Tubal ligation  April 1982  . Dilatation & currettage/hysteroscopy with resectocope N/A 11/18/2012    Procedure: DILATATION & CURETTAGE/HYSTEROSCOPY WITH RESECTOCOPE;  Surgeon: Marvene Staff, MD;  Location: [facility];  Service: Gynecology;  Laterality: N/A;  . Breast biopsy Left 09/11/80  . Vaginal hysterectomy  12/09/2012    Complete hysterectomy [facility]    SOCIAL HISTORY: History   Social History  . Marital Status: Married    Spouse Name: N/A  . Number of Children: 2  . Years of Education: N/A   Occupational History  . Not on file.   Social History Main Topics  . Smoking status: Never Smoker   . Smokeless tobacco: Never Used  . Alcohol Use: No  . Drug Use: No  . Sexual Activity: Not on file   Other Topics Concern  . Not on file   Social History Narrative  Married, 70 years 2 grandchildren, 2 children Ex-teacher and Microbiologist Non-smoker ETOH, none  FAMILY HISTORY: Family History  Problem Relation Age of Onset  . Breast cancer  Mother 35  . Heart attack Mother 4  . Colon cancer Father 15  . Heart attack Father 59  . Breast cancer Sister 24  . Heart attack Paternal Grandfather 31  . Breast cancer Other     mother's paternal first cousin dx in late 40s-early 84s   indicated that her mother is deceased. She indicated that her father is deceased. She indicated that both of her sisters are alive. She indicated that her maternal grandmother is deceased. She indicated  that her maternal grandfather is deceased. She indicated that her paternal grandmother is deceased. She indicated that her paternal grandfather is deceased. She indicated that her other is deceased.  Mother deceased at 67, heart attack Father deceased at 39, congested heart failure 2 sisters, one with breast cancer  ALLERGIES:  is allergic to codeine; other; tuberculin ppd; and tylox.  MEDICATIONS:  Current Outpatient Prescriptions  Medication Sig Dispense Refill  . acetaminophen (TYLENOL) 500 MG tablet Take 500 mg by mouth every 6 (six) hours as needed.    Marland Kitchen alendronate (FOSAMAX) 10 MG tablet Take 70 mg by mouth once a week. Take with a full glass of water on an empty stomach.    . Calcium-Vitamin D (CALTRATE 600 PLUS-VIT D PO) Take 2 each by mouth daily.    . cholecalciferol (VITAMIN D) 1000 UNITS tablet Take 1,000 Units by mouth daily.    . folic acid (FOLVITE) 414 MCG tablet Take 800 mcg by mouth daily.     . hydroxychloroquine (PLAQUENIL) 200 MG tablet Take by mouth daily.    . methotrexate (RHEUMATREX) 2.5 MG tablet Take 2.5 mg by mouth 2 (two) times a week. Caution:Chemotherapy. Protect from light.  Pt to take 4 tablets on Friday and 4 tablets on Saturday    . Multiple Vitamin (MULTIVITAMIN) tablet Take 1 tablet by mouth daily.    . predniSONE (DELTASONE) 5 MG tablet Take 5 mg by mouth daily with breakfast. Only takes when she has flares up    . raloxifene (EVISTA) 60 MG tablet Take 60 mg by mouth daily.    Marland Kitchen acyclovir (ZOVIRAX) 400 MG tablet Take 1 tablet (400 mg total) by mouth 2 (two) times daily. 60 tablet 2   No current facility-administered medications for this visit.    Review of Systems  Respiratory: Positive for cough.   Psychiatric: positive for anxiety All other systems reviewed and are negative.  14 point ROS was done and is otherwise as detailed above or in HPI   PHYSICAL EXAMINATION: ECOG PERFORMANCE STATUS: 0 - Asymptomatic  Filed Vitals:   07/31/14 0856    BP: 107/45  Pulse: 79  Temp: 98.2 F (36.8 C)  Resp: 16   Filed Weights   07/31/14 0856  Weight: 113 lb 11.2 oz (51.574 kg)     Physical Exam  Constitutional: She is oriented to person, place, and time and well-developed, well-nourished, and in no distress.  HENT:  Head: Normocephalic and atraumatic.  Nose: Nose normal.  Mouth/Throat: Oropharynx is clear and moist. No oropharyngeal exudate.  Eyes: Conjunctivae and EOM are normal. Pupils are equal, round, and reactive to light. Right eye exhibits no discharge. Left eye exhibits no discharge. No scleral icterus.  Neck: Normal range of motion. Neck supple. No tracheal deviation present. No thyromegaly present.  Cardiovascular: Normal rate, regular rhythm and normal heart sounds.  Exam reveals no gallop and no friction rub.   No murmur heard. Pulmonary/Chest: Effort normal and breath sounds normal. She has no wheezes.  She has no rales.  Abdominal: Soft. Bowel sounds are normal. She exhibits no distension and no mass. There is no tenderness. There is no rebound and no guarding.  Musculoskeletal: Normal range of motion. She exhibits no edema.  Lymphadenopathy:    She has no cervical adenopathy.  Neurological: She is alert and oriented to person, place, and time. She has normal reflexes. No cranial nerve deficit. Gait normal. Coordination normal.  Skin: Skin is warm and dry. No rash noted.  Psychiatric: Mood, memory, affect and judgment normal.  Nursing note and vitals reviewed.   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 We discussed the rationale behind starting with chemotherapy. I did spent time explaining to the patient some of the experiences I have had over the last 10 years with treating patients with stage IV endometrial cancer.  We discussed the port and she is agreeable to port placement. We will arrange for chemotherapy teaching. She would like to look at the hip prostheses we have available here at [facility] prior to traveling back to [place].  I tried to reassure her that I do not want her worrying constantly about what she eats as she proceeds through chemotherapy. I advised her to use common sense and eat all things in moderation. I advised her that if she enjoys chocolate to feel free to eat chocolate as she goes through treatment.  Advised her that she does not need to wear a mask while out in public. I also advised her that she should be able to attend church.  We addressed some of the potential side effects of chemotherapy including but not limited to blood count abnormalities, increased risk of infection, neuropathy, nausea, vomiting, diarrhea, and hair loss.  I will see her back after her first cycle of chemotherapy with carboplatin and Taxol to assess her tolerance. The plan is for 3 cycles followed by imaging/restaging. Should she obtain a response as I anticipate she will the goal is to complete a total of 6 cycles.  I have prescribed her acyclovir to take twice daily for shingles prevention. I advised her however there is no perfect way to prevent shingles. I tried to reassure her that most patients on chemotherapy do not get shingles. I did advise her however  that I have had a few. She was also given a Pneumovax today.   Orders Placed This Encounter  Procedures  . IR Fluoro Guide CV Line Left    Standing Status: Future     Number of Occurrences:      Standing Expiration Date: 10/01/2015    Order Specific Question:  Reason for Exam (SYMPTOM  OR DIAGNOSIS REQUIRED)    Answer:  stage IV endometrial cancer    Order Specific Question:  Preferred Imaging Location?    Answer:  [facility]   All questions were answered. The patient knows to call the clinic with any problems, questions or concerns.   This document serves as a record of services personally performed by Ancil Linsey, MD. It was created on her behalf by Janace Hoard, a trained medical scribe. The creation of this record is based on the scribe's personal observations and the provider's statements to them. This document has been checked and approved by the attending provider.  I have reviewed the above documentation for accuracy and completeness, and I agree with the above.  This note was electronically signed.    Kelby Fam. Whitney Muse, MD

## 2014-07-31 NOTE — Progress Notes (Signed)
[facility] Clinical Social Work  Clinical Social Work was referred by [facility] rounding for assessment of psychosocial needs due to new pt.  Clinical Social Worker met with patient and her husband at [facility] to offer support and assess for needs. Pt had many questions about Support Programs available to assist at [facility]. CSW and RN Navigator attempted to answer her questions.  CSW reviewed role of CSW, Support Programs and additional resources to assist pt and family. CSW provided pt and her family with CSW info sheet with phone number for further follow up needs. CSW referred pt to Financial Advocate to assist with any insurance questions she had.    Clinical Social Work interventions: Supportive listening Education  Loren Racer, [place] Tuesdays 8:30-1pm Wednesdays 8:30-12pm  [phone]

## 2014-07-31 NOTE — Patient Instructions (Addendum)
..  [place] at [facility] Discharge Instructions  RECOMMENDATIONS MADE BY THE CONSULTANT AND ANY TEST RESULTS WILL BE SENT TO YOUR REFERRING PHYSICIAN.  Exam today per Dr. Whitney Muse We will get you set up for port placement  [facility] will do your chemo teaching and schedule your chemo Dr. Whitney Muse will see you one week after chemo starts   Thank you for choosing [place] [facility] to provide your oncology and hematology care.  To afford each patient quality time with our provider, please arrive at least 15 minutes before your scheduled appointment time.    You need to re-schedule your appointment should you arrive 10 or more minutes late.  We strive to give you quality time with our providers, and arriving late affects you and other patients whose appointments are after yours.  Also, if you no show three or more times for appointments you may be dismissed from the clinic at the providers discretion.     Again, thank you for choosing [facility]  Our hope is that these requests will decrease the amount of time that you wait before being seen by our physicians.       _____________________________________________________________  Should you have questions after your visit to [facility], please contact our office at [phone] between the hours of 8:30 a.m. and 4:30 p.m.  Voicemails left after 4:30 p.m. will not be returned until the following business day.  For prescription refill requests, have your pharmacy contact our office.

## 2014-07-31 NOTE — Progress Notes (Signed)
Patient tolerated her Pneumococcal vaccine well today.

## 2014-08-01 ENCOUNTER — Other Ambulatory Visit (HOSPITAL_COMMUNITY): Payer: Self-pay | Admitting: *Deleted

## 2014-08-01 DIAGNOSIS — C541 Malignant neoplasm of endometrium: Secondary | ICD-10-CM

## 2014-08-01 MED ORDER — DEXAMETHASONE 4 MG PO TABS: ORAL_TABLET | ORAL | Status: DC

## 2014-08-01 MED ORDER — LIDOCAINE-PRILOCAINE 2.5-2.5 % EX CREA: TOPICAL_CREAM | CUTANEOUS | Status: DC

## 2014-08-01 MED ORDER — ONDANSETRON HCL 8 MG PO TABS: 8.0000 mg | ORAL_TABLET | Freq: Three times a day (TID) | ORAL | Status: DC | PRN

## 2014-08-01 MED ORDER — PROCHLORPERAZINE MALEATE 10 MG PO TABS: 10.0000 mg | ORAL_TABLET | Freq: Four times a day (QID) | ORAL | Status: DC | PRN

## 2014-08-01 NOTE — Patient Instructions (Addendum)
[place]   CHEMOTHERAPY INSTRUCTIONS  Premeds: Zofran - for nausea/vomiting prevention/reduction. Benadryl -given IV- antihistamine. Given to reduce the risk of you having an allergic reaction to the Taxol chemo. Pepcid - given to reduce the risk of you having an allergic reactino to the Taxol chemo. Dexamethasone - steroid - given to reduce the risk of you having an allergic type reaction to the chemotherapy. Dex can cause you to feel energized, nervous/anxious/jittery, make you have trouble sleeping, and/or make you feel hot/flushed in the face/neck and/or look pink/red in the face/neck. These side effects will pass as the Dex wears off. (takes 1 hour for premeds to infuse)   Taxol - the first time you receive this drug we will titrate it very slowly to ensure that you do not have or are not having an allergic reaction to the chemo. Side Effects: hair loss, lowers your white blood cells (fight infection), muscle aches, nausea/vomiting, irritation to the mouth (mouth sores, pain in your mouth) *neuropathy - numbness/tingling/burning in hands/fingers/feet/toes. We need to know as soon as this begins to happen so that we can monitor it and treat if necessary. The numbness generally begins in the fingertips of tips of toes and then begins to travel up the finger/toe/hand/foot. We never want you getting to where you can't pick up a pen, coin, zip a zipper, button a button, or have trouble walking. You must tell us immediately if you are experiencing peripheral neuropathy! (It will take 3 hours to infuse)  Carboplatin - this medication can be hard on your kidneys - this is why we need you to drink 64 oz of fluid (preferably water/decaff fluids) 2 days prior to chemo and for up to 4-5 days after chemo. Drink more if you can. This will help to keep your kidneys flushed. This can cause mild hair loss, lower your platelets (which make your blood clot), lower your white blood  cells (fight infection), and cause nausea/vomiting. (Takes 30 minutes to infuse)  Neulasta - this medication is not chemo but being given because you have had chemo. It is usually given 24 hours after the completion of chemotherapy. This medication works by boosting your bone marrow's supply of white blood cells. White blood cells are what protect our bodies against infection. The major side effect of this medication is bone or muscle pain. The drug of choice to relieve or lessen the pain is Aleve or Ibuprofen. If a physician has ever told you not to take Aleve or Ibuprofen - then don't take it. You should then take Tylenol/acetaminophen. Take either medication as the bottle directs you to.  The level of pain you experience as a result of this injection can range from none, to mild or moderate, or severe. Please let us know if you develop moderate or severe bone pain.      POTENTIAL SIDE EFFECTS OF TREATMENT: Increased Susceptibility to Infection, Vomiting, Constipation, Hair Thinning, Changes in Character of Skin and Nails (brittleness, dryness,etc.), Bone Marrow Suppression, Abdominal Cramping, Complete Hair Loss, Nausea, Diarrhea, Sun Sensitivity and Mouth Sores   SELF IMAGE NEEDS AND REFERRALS MADE: Obtain hair accessories as soon as possible (wigs, scarves, turbans,caps,etc.)  Referral to Look Good, Feel Better consultant (paper provided)   EDUCATIONAL MATERIALS GIVEN AND REVIEWED: Chemotherapy and You Book Specific Instructions Sheets: Carboplatin, Taxol, Neulasta, Zofran, Dexamethasone, Pepcid, Benadryl, Compazine, EMLA cream.    SELF CARE ACTIVITIES WHILE ON CHEMOTHERAPY: Increase your fluid intake 48 hours prior to treatment  and drink at least 2 quarts per day after treatment., No alcohol intake., No aspirin or other medications unless approved by your oncologist., Eat foods that are light and easy to digest., Eat foods at cold or room temperature., No fried, fatty, or spicy foods  immediately before or after treatment., Have teeth cleaned professionally before starting treatment. Keep dentures and partial plates clean., Use soft toothbrush and do not use mouthwashes that contain alcohol. Biotene is a good mouthwash that is available at most pharmacies or may be ordered by calling [phone]., Use warm salt water gargles (1 teaspoon salt per 1 quart warm water) before and after meals and at bedtime. Or you may rinse with 2 tablespoons of three -percent hydrogen peroxide mixed in eight ounces of water., Always use sunscreen with SPF (Sun Protection Factor) of 30 or higher., Use your nausea medication as directed to prevent nausea., Use your stool softener or laxative as directed to prevent constipation. and Use your anti-diarrheal medication as directed to stop diarrhea.  Please wash your hands for at least 30 seconds using warm soapy water. Handwashing is the #1 way to prevent the spread of germs. Stay away from sick people or people who are getting over a cold. If you develop respiratory systems such as green/yellow mucus production or productive cough or persistent cough let us know and we will see if you need an antibiotic. It is a good idea to keep a pair of gloves on when going into grocery stores/[employer] to decrease your risk of coming into contact with germs on the carts, etc. Carry alcohol hand gel with you at all times and use it frequently if out in public. All foods need to be cooked thoroughly. No raw foods. No medium or undercooked meats, eggs. If your food is cooked medium well, it does not need to be hot pink or saturated with bloody liquid at all. Vegetables and fruits need to be washed/rinsed under the faucet with a dish detergent before being consumed. You can eat raw fruits and vegetables unless we tell you otherwise but it would be best if you cooked them or bought frozen. Do not eat off of salad bars or hot bars unless you really trust the cleanliness of the  restaurant. If you need dental work, please let Dr. Whitney Muse know before you go for your appointment so that we can coordinate the best possible time for you in regards to your chemo regimen. You need to also let your dentist know that you are actively taking chemo. We may need to do labs prior to your dental appointment. We also want your bowels moving at least every other day. If this is not happening, we need to know so that we can get you on a bowel regimen to help you go.    MEDICATIONS: You have been given prescriptions for the following medications:  Dexamethasone '4mg'$  tablet. The day before chemo take 5 tablets ('20mg'$ ) in the am and take 5 tablets ('20mg'$ ) in the pm. The morning of chemo take 5 tablets ('20mg'$ ).  Zofran '8mg'$  tablet. Take 1 tablet every 8 hours as needed for nausea/vomiting. (#1 nausea med to take, this can constipate)  Compazine '10mg'$  tablet. Take 1 tablet every 6 hours as needed for nausea/vomiting. (#2 nausea med to take, this can make you sleepy)  EMLA cream. Apply a quarter size amount to port site 1 hour prior to chemo. Do not rub in. Cover with plastic wrap.   Over-the-Counter Meds:  Miralax 1  capful in 8 oz of fluid daily. May increase to two times a day if needed. This is a stool softener. If this doesn't work proceed you can add:  Senokot S  - start with 1 tablet two times a day and increase to 4 tablets two times a day if needed. (total of 8 tablets in a 24 hour period). This is a stimulant laxative.   Call us if this does not help your bowels move.   Imodium '2mg'$  capsule. Take 2 capsules after the 1st loose stool and then 1 capsule every 2 hours until you go a total of 12 hours without having a loose stool. Call the [facility] if loose stools continue.     SYMPTOMS TO REPORT AS SOON AS POSSIBLE AFTER TREATMENT:  FEVER GREATER THAN 100.5 F  CHILLS WITH OR WITHOUT FEVER  NAUSEA AND VOMITING THAT IS NOT CONTROLLED WITH YOUR NAUSEA MEDICATION  UNUSUAL  SHORTNESS OF BREATH  UNUSUAL BRUISING OR BLEEDING  TENDERNESS IN MOUTH AND THROAT WITH OR WITHOUT PRESENCE OF ULCERS  URINARY PROBLEMS  BOWEL PROBLEMS  UNUSUAL RASH    Wear comfortable clothing and clothing appropriate for easy access to any Portacath or PICC line. Let us know if there is anything that we can do to make your therapy better!      I have been informed and understand all of the instructions given to me and have received a copy. I have been instructed to call the clinic [phone] or my family physician as soon as possible for continued medical care, if indicated. I do not have any more questions at this time but understand that I may call the [facility] or the Patient Navigator at [phone] during office hours should I have questions or need assistance in obtaining follow-up care.            Carboplatin injection What is this medicine? CARBOPLATIN (KAR boe pla tin) is a chemotherapy drug. It targets fast dividing cells, like cancer cells, and causes these cells to die. This medicine is used to treat ovarian cancer and many other cancers. This medicine may be used for other purposes; ask your health care provider or pharmacist if you have questions. COMMON BRAND NAME(S): Paraplatin What should I tell my health care provider before I take this medicine? They need to know if you have any of these conditions: -blood disorders -hearing problems -kidney disease -recent or ongoing radiation therapy -an unusual or allergic reaction to carboplatin, cisplatin, other chemotherapy, other medicines, foods, dyes, or preservatives -pregnant or trying to get pregnant -breast-feeding How should I use this medicine? This drug is usually given as an infusion into a vein. It is administered in a hospital or clinic by a specially trained health care professional. Talk to your pediatrician regarding the use of this medicine in children. Special care may be  needed. Overdosage: If you think you have taken too much of this medicine contact a poison control center or emergency room at once. NOTE: This medicine is only for you. Do not share this medicine with others. What if I miss a dose? It is important not to miss a dose. Call your doctor or health care professional if you are unable to keep an appointment. What may interact with this medicine? -medicines for seizures -medicines to increase blood counts like filgrastim, pegfilgrastim, sargramostim -some antibiotics like amikacin, gentamicin, neomycin, streptomycin, tobramycin -vaccines Talk to your doctor or health care professional before taking any of these medicines: -acetaminophen -  aspirin -ibuprofen -ketoprofen -naproxen This list may not describe all possible interactions. Give your health care provider a list of all the medicines, herbs, non-prescription drugs, or dietary supplements you use. Also tell them if you smoke, drink alcohol, or use illegal drugs. Some items may interact with your medicine. What should I watch for while using this medicine? Your condition will be monitored carefully while you are receiving this medicine. You will need important blood work done while you are taking this medicine. This drug may make you feel generally unwell. This is not uncommon, as chemotherapy can affect healthy cells as well as cancer cells. Report any side effects. Continue your course of treatment even though you feel ill unless your doctor tells you to stop. In some cases, you may be given additional medicines to help with side effects. Follow all directions for their use. Call your doctor or health care professional for advice if you get a fever, chills or sore throat, or other symptoms of a cold or flu. Do not treat yourself. This drug decreases your body's ability to fight infections. Try to avoid being around people who are sick. This medicine may increase your risk to bruise or bleed.  Call your doctor or health care professional if you notice any unusual bleeding. Be careful brushing and flossing your teeth or using a toothpick because you may get an infection or bleed more easily. If you have any dental work done, tell your dentist you are receiving this medicine. Avoid taking products that contain aspirin, acetaminophen, ibuprofen, naproxen, or ketoprofen unless instructed by your doctor. These medicines may hide a fever. Do not become pregnant while taking this medicine. Women should inform their doctor if they wish to become pregnant or think they might be pregnant. There is a potential for serious side effects to an unborn child. Talk to your health care professional or pharmacist for more information. Do not breast-feed an infant while taking this medicine. What side effects may I notice from receiving this medicine? Side effects that you should report to your doctor or health care professional as soon as possible: -allergic reactions like skin rash, itching or hives, swelling of the face, lips, or tongue -signs of infection - fever or chills, cough, sore throat, pain or difficulty passing urine -signs of decreased platelets or bleeding - bruising, pinpoint red spots on the skin, black, tarry stools, nosebleeds -signs of decreased red blood cells - unusually weak or tired, fainting spells, lightheadedness -breathing problems -changes in hearing -changes in vision -chest pain -high blood pressure -low blood counts - This drug may decrease the number of white blood cells, red blood cells and platelets. You may be at increased risk for infections and bleeding. -nausea and vomiting -pain, swelling, redness or irritation at the injection site -pain, tingling, numbness in the hands or feet -problems with balance, talking, walking -trouble passing urine or change in the amount of urine Side effects that usually do not require medical attention (report to your doctor or health  care professional if they continue or are bothersome): -hair loss -loss of appetite -metallic taste in the mouth or changes in taste This list may not describe all possible side effects. Call your doctor for medical advice about side effects. You may report side effects to FDA at 1-800-FDA-1088. Where should I keep my medicine? This drug is given in a hospital or clinic and will not be stored at home. NOTE: This sheet is a summary. It may not cover all possible  information. If you have questions about this medicine, talk to your doctor, pharmacist, or health care provider.  2015, [employer]/Gold Standard. (2007-03-29 14:38:05) Paclitaxel injection What is this medicine? PACLITAXEL (PAK li TAX el) is a chemotherapy drug. It targets fast dividing cells, like cancer cells, and causes these cells to die. This medicine is used to treat ovarian cancer, breast cancer, and other cancers. This medicine may be used for other purposes; ask your health care provider or pharmacist if you have questions. COMMON BRAND NAME(S): Onxol, Taxol What should I tell my health care provider before I take this medicine? They need to know if you have any of these conditions: -blood disorders -irregular heartbeat -infection (especially a virus infection such as chickenpox, cold sores, or herpes) -liver disease -previous or ongoing radiation therapy -an unusual or allergic reaction to paclitaxel, alcohol, polyoxyethylated castor oil, other chemotherapy agents, other medicines, foods, dyes, or preservatives -pregnant or trying to get pregnant -breast-feeding How should I use this medicine? This drug is given as an infusion into a vein. It is administered in a hospital or clinic by a specially trained health care professional. Talk to your pediatrician regarding the use of this medicine in children. Special care may be needed. Overdosage: If you think you have taken too much of this medicine contact a poison control  center or emergency room at once. NOTE: This medicine is only for you. Do not share this medicine with others. What if I miss a dose? It is important not to miss your dose. Call your doctor or health care professional if you are unable to keep an appointment. What may interact with this medicine? Do not take this medicine with any of the following medications: -disulfiram -metronidazole This medicine may also interact with the following medications: -cyclosporine -diazepam -ketoconazole -medicines to increase blood counts like filgrastim, pegfilgrastim, sargramostim -other chemotherapy drugs like cisplatin, doxorubicin, epirubicin, etoposide, teniposide, vincristine -quinidine -testosterone -vaccines -verapamil Talk to your doctor or health care professional before taking any of these medicines: -acetaminophen -aspirin -ibuprofen -ketoprofen -naproxen This list may not describe all possible interactions. Give your health care provider a list of all the medicines, herbs, non-prescription drugs, or dietary supplements you use. Also tell them if you smoke, drink alcohol, or use illegal drugs. Some items may interact with your medicine. What should I watch for while using this medicine? Your condition will be monitored carefully while you are receiving this medicine. You will need important blood work done while you are taking this medicine. This drug may make you feel generally unwell. This is not uncommon, as chemotherapy can affect healthy cells as well as cancer cells. Report any side effects. Continue your course of treatment even though you feel ill unless your doctor tells you to stop. In some cases, you may be given additional medicines to help with side effects. Follow all directions for their use. Call your doctor or health care professional for advice if you get a fever, chills or sore throat, or other symptoms of a cold or flu. Do not treat yourself. This drug decreases your  body's ability to fight infections. Try to avoid being around people who are sick. This medicine may increase your risk to bruise or bleed. Call your doctor or health care professional if you notice any unusual bleeding. Be careful brushing and flossing your teeth or using a toothpick because you may get an infection or bleed more easily. If you have any dental work done, tell your dentist you are receiving  this medicine. Avoid taking products that contain aspirin, acetaminophen, ibuprofen, naproxen, or ketoprofen unless instructed by your doctor. These medicines may hide a fever. Do not become pregnant while taking this medicine. Women should inform their doctor if they wish to become pregnant or think they might be pregnant. There is a potential for serious side effects to an unborn child. Talk to your health care professional or pharmacist for more information. Do not breast-feed an infant while taking this medicine. Men are advised not to father a child while receiving this medicine. What side effects may I notice from receiving this medicine? Side effects that you should report to your doctor or health care professional as soon as possible: -allergic reactions like skin rash, itching or hives, swelling of the face, lips, or tongue -low blood counts - This drug may decrease the number of white blood cells, red blood cells and platelets. You may be at increased risk for infections and bleeding. -signs of infection - fever or chills, cough, sore throat, pain or difficulty passing urine -signs of decreased platelets or bleeding - bruising, pinpoint red spots on the skin, black, tarry stools, nosebleeds -signs of decreased red blood cells - unusually weak or tired, fainting spells, lightheadedness -breathing problems -chest pain -high or low blood pressure -mouth sores -nausea and vomiting -pain, swelling, redness or irritation at the injection site -pain, tingling, numbness in the hands or  feet -slow or irregular heartbeat -swelling of the ankle, feet, hands Side effects that usually do not require medical attention (report to your doctor or health care professional if they continue or are bothersome): -bone pain -complete hair loss including hair on your head, underarms, pubic hair, eyebrows, and eyelashes -changes in the color of fingernails -diarrhea -loosening of the fingernails -loss of appetite -muscle or joint pain -red flush to skin -sweating This list may not describe all possible side effects. Call your doctor for medical advice about side effects. You may report side effects to FDA at 1-800-FDA-1088. Where should I keep my medicine? This drug is given in a hospital or clinic and will not be stored at home. NOTE: This sheet is a summary. It may not cover all possible information. If you have questions about this medicine, talk to your doctor, pharmacist, or health care provider.  2015, [employer]/Gold Standard. (2012-02-15 16:41:21) Pegfilgrastim injection What is this medicine? PEGFILGRASTIM (peg fil GRA stim) is a long-acting granulocyte colony-stimulating factor that stimulates the growth of neutrophils, a type of white blood cell important in the body's fight against infection. It is used to reduce the incidence of fever and infection in patients with certain types of cancer who are receiving chemotherapy that affects the bone marrow. This medicine may be used for other purposes; ask your health care provider or pharmacist if you have questions. COMMON BRAND NAME(S): Neulasta What should I tell my health care provider before I take this medicine? They need to know if you have any of these conditions: -latex allergy -ongoing radiation therapy -sickle cell disease -skin reactions to acrylic adhesives (On-Body Injector only) -an unusual or allergic reaction to pegfilgrastim, filgrastim, other medicines, foods, dyes, or preservatives -pregnant or trying to get  pregnant -breast-feeding How should I use this medicine? This medicine is for injection under the skin. If you get this medicine at home, you will be taught how to prepare and give the pre-filled syringe or how to use the On-body Injector. Refer to the patient Instructions for Use for detailed instructions. Use exactly as  directed. Take your medicine at regular intervals. Do not take your medicine more often than directed. It is important that you put your used needles and syringes in a special sharps container. Do not put them in a trash can. If you do not have a sharps container, call your pharmacist or healthcare provider to get one. Talk to your pediatrician regarding the use of this medicine in children. Special care may be needed. Overdosage: If you think you have taken too much of this medicine contact a poison control center or emergency room at once. NOTE: This medicine is only for you. Do not share this medicine with others. What if I miss a dose? It is important not to miss your dose. Call your doctor or health care professional if you miss your dose. If you miss a dose due to an On-body Injector failure or leakage, a new dose should be administered as soon as possible using a single prefilled syringe for manual use. What may interact with this medicine? Interactions have not been studied. Give your health care provider a list of all the medicines, herbs, non-prescription drugs, or dietary supplements you use. Also tell them if you smoke, drink alcohol, or use illegal drugs. Some items may interact with your medicine. This list may not describe all possible interactions. Give your health care provider a list of all the medicines, herbs, non-prescription drugs, or dietary supplements you use. Also tell them if you smoke, drink alcohol, or use illegal drugs. Some items may interact with your medicine. What should I watch for while using this medicine? You may need blood work done while you  are taking this medicine. If you are going to need a MRI, CT scan, or other procedure, tell your doctor that you are using this medicine (On-Body Injector only). What side effects may I notice from receiving this medicine? Side effects that you should report to your doctor or health care professional as soon as possible: -allergic reactions like skin rash, itching or hives, swelling of the face, lips, or tongue -dizziness -fever -pain, redness, or irritation at site where injected -pinpoint red spots on the skin -shortness of breath or breathing problems -stomach or side pain, or pain at the shoulder -swelling -tiredness -trouble passing urine Side effects that usually do not require medical attention (report to your doctor or health care professional if they continue or are bothersome): -bone pain -muscle pain This list may not describe all possible side effects. Call your doctor for medical advice about side effects. You may report side effects to FDA at 1-800-FDA-1088. Where should I keep my medicine? Keep out of the reach of children. Store pre-filled syringes in a refrigerator between 2 and 8 degrees C (36 and 46 degrees F). Do not freeze. Keep in carton to protect from light. Throw away this medicine if it is left out of the refrigerator for more than 48 hours. Throw away any unused medicine after the expiration date. NOTE: This sheet is a summary. It may not cover all possible information. If you have questions about this medicine, talk to your doctor, pharmacist, or health care provider.  2015, [employer] Standard. (2013-03-23 16:14:05) Ondansetron injection What is this medicine? ONDANSETRON (on DAN se tron) is used to treat nausea and vomiting caused by chemotherapy. It is also used to prevent or treat nausea and vomiting after surgery. This medicine may be used for other purposes; ask your health care provider or pharmacist if you have questions. COMMON BRAND NAME(S):  Zofran  What should I tell my health care provider before I take this medicine? They need to know if you have any of these conditions: -heart disease -history of irregular heartbeat -liver disease -low levels of magnesium or potassium in the blood -an unusual or allergic reaction to ondansetron, granisetron, other medicines, foods, dyes, or preservatives -pregnant or trying to get pregnant -breast-feeding How should I use this medicine? This medicine is for infusion into a vein. It is given by a health care professional in a hospital or clinic setting. Talk to your pediatrician regarding the use of this medicine in children. Special care may be needed. Overdosage: If you think you have taken too much of this medicine contact a poison control center or emergency room at once. NOTE: This medicine is only for you. Do not share this medicine with others. What if I miss a dose? This does not apply. What may interact with this medicine? Do not take this medicine with any of the following medications: -apomorphine -certain medicines for fungal infections like fluconazole, itraconazole, ketoconazole, posaconazole, voriconazole -cisapride -dofetilide -dronedarone -pimozide -thioridazine -ziprasidone This medicine may also interact with the following medications: -carbamazepine -certain medicines for depression, anxiety, or psychotic disturbances -fentanyl -linezolid -MAOIs like Carbex, Eldepryl, Marplan, Nardil, and Parnate -methylene blue (injected into a vein) -other medicines that prolong the QT interval (cause an abnormal heart rhythm) -phenytoin -rifampicin -tramadol This list may not describe all possible interactions. Give your health care provider a list of all the medicines, herbs, non-prescription drugs, or dietary supplements you use. Also tell them if you smoke, drink alcohol, or use illegal drugs. Some items may interact with your medicine. What should I watch for while  using this medicine? Your condition will be monitored carefully while you are receiving this medicine. What side effects may I notice from receiving this medicine? Side effects that you should report to your doctor or health care professional as soon as possible: -allergic reactions like skin rash, itching or hives, swelling of the face, lips, or tongue -breathing problems -confusion -dizziness -fast or irregular heartbeat -feeling faint or lightheaded, falls -fever and chills -loss of balance or coordination -seizures -sweating -swelling of the hands and feet -tightness in the chest -tremors -unusually weak or tired Side effects that usually do not require medical attention (report to your doctor or health care professional if they continue or are bothersome): -constipation or diarrhea -headache This list may not describe all possible side effects. Call your doctor for medical advice about side effects. You may report side effects to FDA at 1-800-FDA-1088. Where should I keep my medicine? This drug is given in a hospital or clinic and will not be stored at home. NOTE: This sheet is a summary. It may not cover all possible information. If you have questions about this medicine, talk to your doctor, pharmacist, or health care provider.  2015, [employer] Standard. (2012-09-28 16:18:28) Dexamethasone injection What is this medicine? DEXAMETHASONE (dex a METH a sone) is a corticosteroid. It is used to treat inflammation of the skin, joints, lungs, and other organs. Common conditions treated include asthma, allergies, and arthritis. It is also used for other conditions, like blood disorders and diseases of the adrenal glands. This medicine may be used for other purposes; ask your health care provider or pharmacist if you have questions. COMMON BRAND NAME(S): Decadron, Solurex What should I tell my health care provider before I take this medicine? They need to know if you have any of  these conditions: -blood clotting  problems -Cushing's syndrome -diabetes -glaucoma -heart problems or disease -high blood pressure -infection like herpes, measles, tuberculosis, or chickenpox -kidney disease -liver disease -mental problems -myasthenia gravis -osteoporosis -previous heart attack -seizures -stomach, ulcer or intestine disease including colitis and diverticulitis -thyroid problem -an unusual or allergic reaction to dexamethasone, corticosteroids, other medicines, lactose, foods, dyes, or preservatives -pregnant or trying to get pregnant -breast-feeding How should I use this medicine? This medicine is for injection into a muscle, joint, lesion, soft tissue, or vein. It is given by a health care professional in a hospital or clinic setting. Talk to your pediatrician regarding the use of this medicine in children. Special care may be needed. Overdosage: If you think you have taken too much of this medicine contact a poison control center or emergency room at once. NOTE: This medicine is only for you. Do not share this medicine with others. What if I miss a dose? This may not apply. If you are having a series of injections over a prolonged period, try not to miss an appointment. Call your doctor or health care professional to reschedule if you are unable to keep an appointment. What may interact with this medicine? Do not take this medicine with any of the following medications: -mifepristone, RU-486 -vaccines This medicine may also interact with the following medications: -amphotericin B -antibiotics like clarithromycin, erythromycin, and troleandomycin -aspirin and aspirin-like drugs -barbiturates like phenobarbital -carbamazepine -cholestyramine -cholinesterase inhibitors like donepezil, galantamine, rivastigmine, and tacrine -cyclosporine -digoxin -diuretics -ephedrine -female hormones, like estrogens or progestins and birth control  pills -indinavir -isoniazid -ketoconazole -medicines for diabetes -medicines that improve muscle tone or strength for conditions like myasthenia gravis -NSAIDs, medicines for pain and inflammation, like ibuprofen or naproxen -phenytoin -rifampin -thalidomide -warfarin This list may not describe all possible interactions. Give your health care provider a list of all the medicines, herbs, non-prescription drugs, or dietary supplements you use. Also tell them if you smoke, drink alcohol, or use illegal drugs. Some items may interact with your medicine. What should I watch for while using this medicine? Your condition will be monitored carefully while you are receiving this medicine. If you are taking this medicine for a long time, carry an identification card with your name and address, the type and dose of your medicine, and your doctor's name and address. This medicine may increase your risk of getting an infection. Stay away from people who are sick. Tell your doctor or health care professional if you are around anyone with measles or chickenpox. Talk to your health care provider before you get any vaccines that you take this medicine. If you are going to have surgery, tell your doctor or health care professional that you have taken this medicine within the last twelve months. Ask your doctor or health care professional about your diet. You may need to lower the amount of salt you eat. The medicine can increase your blood sugar. If you are a diabetic check with your doctor if you need help adjusting the dose of your diabetic medicine. What side effects may I notice from receiving this medicine? Side effects that you should report to your doctor or health care professional as soon as possible: -allergic reactions like skin rash, itching or hives, swelling of the face, lips, or tongue -black or tarry stools -change in the amount of urine -changes in vision -confusion, excitement, restlessness,  a false sense of well-being -fever, sore throat, sneezing, cough, or other signs of infection, wounds that will not heal -hallucinations -increased  thirst -mental depression, mood swings, mistaken feelings of self importance or of being mistreated -pain in hips, back, ribs, arms, shoulders, or legs -pain, redness, or irritation at the injection site -redness, blistering, peeling or loosening of the skin, including inside the mouth -rounding out of face -swelling of feet or lower legs -unusual bleeding or bruising -unusual tired or weak -wounds that do not heal Side effects that usually do not require medical attention (report to your doctor or health care professional if they continue or are bothersome): -diarrhea or constipation -change in taste -headache -nausea, vomiting -skin problems, acne, thin and shiny skin -touble sleeping -unusual growth of hair on the face or body -weight gain This list may not describe all possible side effects. Call your doctor for medical advice about side effects. You may report side effects to FDA at 1-800-FDA-1088. Where should I keep my medicine? This drug is given in a hospital or clinic and will not be stored at home. NOTE: This sheet is a summary. It may not cover all possible information. If you have questions about this medicine, talk to your doctor, pharmacist, or health care provider.  2015, [employer] Standard. (2007-04-14 14:04:12) Diphenhydramine injection What is this medicine? DIPHENHYDRAMINE (dye fen HYE dra meen) is an antihistamine. It is used to treat the symptoms of an allergic reaction and motion sickness. It is also used to treat Parkinson's disease. This medicine may be used for other purposes; ask your health care provider or pharmacist if you have questions. COMMON BRAND NAME(S): Benadryl What should I tell my health care provider before I take this medicine? They need to know if you have any of these conditions: -asthma  or lung disease -glaucoma -high blood pressure or heart disease -liver disease -pain or difficulty passing urine -prostate trouble -ulcers or other stomach problems -an unusual or allergic reaction to diphenhydramine, antihistamines, other medicines foods, dyes, or preservatives -pregnant or trying to get pregnant -breast-feeding How should I use this medicine? This medicine is for injection into a vein or a muscle. It is usually given by a health care professional in a hospital or clinic setting. If you get this medicine at home, you will be taught how to prepare and give this medicine. Use exactly as directed. Take your medicine at regular intervals. Do not take your medicine more often than directed. It is important that you put your used needles and syringes in a special sharps container. Do not put them in a trash can. If you do not have a sharps container, call your pharmacist or healthcare provider to get one. Talk to your pediatrician regarding the use of this medicine in children. While this drug may be prescribed for selected conditions, precautions do apply. This medicine is not approved for use in newborns and premature babies. Patients over 75 years old may have a stronger reaction and need a smaller dose. Overdosage: If you think you have taken too much of this medicine contact a poison control center or emergency room at once. NOTE: This medicine is only for you. Do not share this medicine with others. What if I miss a dose? If you miss a dose, take it as soon as you can. If it is almost time for your next dose, take only that dose. Do not take double or extra doses. What may interact with this medicine? Do not take this medicine with any of the following medications: -MAOIs like Carbex, Eldepryl, Marplan, Nardil, and Parnate This medicine may also interact with the  following medications: -alcohol -barbiturates, like phenobarbital -medicines for bladder spasm like oxybutynin,  tolterodine -medicines for blood pressure -medicines for depression, anxiety, or psychotic disturbances -medicines for movement abnormalities or Parkinson's disease -medicines for sleep -other medicines for cold, cough or allergy -some medicines for the stomach like chlordiazepoxide, dicyclomine This list may not describe all possible interactions. Give your health care provider a list of all the medicines, herbs, non-prescription drugs, or dietary supplements you use. Also tell them if you smoke, drink alcohol, or use illegal drugs. Some items may interact with your medicine. What should I watch for while using this medicine? Your condition will be monitored carefully while you are receiving this medicine. Tell your doctor or healthcare professional if your symptoms do not start to get better or if they get worse. You may get drowsy or dizzy. Do not drive, use machinery, or do anything that needs mental alertness until you know how this medicine affects you. Do not stand or sit up quickly, especially if you are an older patient. This reduces the risk of dizzy or fainting spells. Alcohol may interfere with the effect of this medicine. Avoid alcoholic drinks. Your mouth may get dry. Chewing sugarless gum or sucking hard candy, and drinking plenty of water may help. Contact your doctor if the problem does not go away or is severe. What side effects may I notice from receiving this medicine? Side effects that you should report to your doctor or health care professional as soon as possible: -allergic reactions like skin rash, itching or hives, swelling of the face, lips, or tongue -breathing problems -changes in vision -chills -confused, agitated, nervous -irregular or fast heartbeat -low blood pressure -seizures -tremor -trouble passing urine -unusual bleeding or bruising -unusually weak or tired Side effects that usually do not require medical attention (report to your doctor or health care  professional if they continue or are bothersome): -constipation, diarrhea -drowsy -headache -loss of appetite -stomach upset, vomiting -sweating -thick mucous This list may not describe all possible side effects. Call your doctor for medical advice about side effects. You may report side effects to FDA at 1-800-FDA-1088. Where should I keep my medicine? Keep out of the reach of children. If you are using this medicine at home, you will be instructed on how to store this medicine. Throw away any unused medicine after the expiration date on the label. NOTE: This sheet is a summary. It may not cover all possible information. If you have questions about this medicine, talk to your doctor, pharmacist, or health care provider.  2015, [employer] Standard. (2007-04-12 14:28:35) Famotidine injection What is this medicine? FAMOTIDINE (fa MOE ti deen) is a type of antihistamine that blocks the release of stomach acid. It is used to treat stomach or intestinal ulcers. It can relieve ulcer pain and discomfort, and the heartburn from acid reflux. This medicine may be used for other purposes; ask your health care provider or pharmacist if you have questions. COMMON BRAND NAME(S): Pepcid What should I tell my health care provider before I take this medicine? They need to know if you have any of these conditions: -kidney or liver disease -an unusual or allergic reaction to famotidine, other medicines, foods, dyes, or preservatives -pregnant or trying to get pregnant -breast-feeding How should I use this medicine? This medicine is for infusion into a vein. It is given by a health care professional in a hospital or clinic setting. Talk to your pediatrician regarding the use of this medicine in children. Special  care may be needed. Overdosage: If you think you have taken too much of this medicine contact a poison control center or emergency room at once. NOTE: This medicine is only for you. Do not share  this medicine with others. What if I miss a dose? This does not apply. What may interact with this medicine? -delavirdine -itraconazole -ketoconazole This list may not describe all possible interactions. Give your health care provider a list of all the medicines, herbs, non-prescription drugs, or dietary supplements you use. Also tell them if you smoke, drink alcohol, or use illegal drugs. Some items may interact with your medicine. What should I watch for while using this medicine? Tell your doctor or health care professional if your condition does not start to get better or gets worse. Do not take with aspirin, ibuprofen, or other antiinflammatory medicines. These can aggravate your condition. Do not smoke cigarettes or drink alcohol. These increase irritation in your stomach and can increase the time it will take for ulcers to heal. Cigarettes and alcohol can also worsen acid reflux or heartburn. If you get black, tarry stools or vomit up what looks like coffee grounds, call your doctor or health care professional at once. You may have a bleeding ulcer. What side effects may I notice from receiving this medicine? Side effects that you should report to your doctor or health care professional as soon as possible: -allergic reactions like skin rash, itching or hives, swelling of the face, lips, or tongue -agitation, nervousness -confusion -hallucinations Side effects that usually do not require medical attention (report to your doctor or health care professional if they continue or are bothersome): -constipation -diarrhea -dizziness -headache This list may not describe all possible side effects. Call your doctor for medical advice about side effects. You may report side effects to FDA at 1-800-FDA-1088. Where should I keep my medicine? This medicine is given in a hospital or clinic. You will not be given this medicine to store at home. NOTE: This sheet is a summary. It may not cover all  possible information. If you have questions about this medicine, talk to your doctor, pharmacist, or health care provider.  2015, [employer] Standard. (2007-04-27 13:24:51) Prochlorperazine tablets What is this medicine? PROCHLORPERAZINE (proe klor PER a zeen) helps to control severe nausea and vomiting. This medicine is also used to treat schizophrenia. It can also help patients who experience anxiety that is not due to psychological illness. This medicine may be used for other purposes; ask your health care provider or pharmacist if you have questions. COMMON BRAND NAME(S): Compazine What should I tell my health care provider before I take this medicine? They need to know if you have any of these conditions: -blood disorders or disease -dementia -liver disease or jaundice -Parkinson's disease -uncontrollable movement disorder -an unusual or allergic reaction to prochlorperazine, other medicines, foods, dyes, or preservatives -pregnant or trying to get pregnant -breast-feeding How should I use this medicine? Take this medicine by mouth with a glass of water. Follow the directions on the prescription label. Take your doses at regular intervals. Do not take your medicine more often than directed. Do not stop taking this medicine suddenly. This can cause nausea, vomiting, and dizziness. Ask your doctor or health care professional for advice. Talk to your pediatrician regarding the use of this medicine in children. Special care may be needed. While this drug may be prescribed for children as young as 2 years for selected conditions, precautions do apply. Overdosage: If you think you  have taken too much of this medicine contact a poison control center or emergency room at once. NOTE: This medicine is only for you. Do not share this medicine with others. What if I miss a dose? If you miss a dose, take it as soon as you can. If it is almost time for your next dose, take only that dose. Do not  take double or extra doses. What may interact with this medicine? Do not take this medicine with any of the following medications: -amoxapine -antidepressants like citalopram, escitalopram, fluoxetine, paroxetine, and sertraline -deferoxamine -dofetilide -maprotiline -tricyclic antidepressants like amitriptyline, clomipramine, imipramine, nortiptyline and others This medicine may also interact with the following medications: -lithium -medicines for pain -phenytoin -propranolol -warfarin This list may not describe all possible interactions. Give your health care provider a list of all the medicines, herbs, non-prescription drugs, or dietary supplements you use. Also tell them if you smoke, drink alcohol, or use illegal drugs. Some items may interact with your medicine. What should I watch for while using this medicine? Visit your doctor or health care professional for regular checks on your progress. You may get drowsy or dizzy. Do not drive, use machinery, or do anything that needs mental alertness until you know how this medicine affects you. Do not stand or sit up quickly, especially if you are an older patient. This reduces the risk of dizzy or fainting spells. Alcohol may interfere with the effect of this medicine. Avoid alcoholic drinks. This medicine can reduce the response of your body to heat or cold. Dress warm in cold weather and stay hydrated in hot weather. If possible, avoid extreme temperatures like saunas, hot tubs, very hot or cold showers, or activities that can cause dehydration such as vigorous exercise. This medicine can make you more sensitive to the sun. Keep out of the sun. If you cannot avoid being in the sun, wear protective clothing and use sunscreen. Do not use sun lamps or tanning beds/booths. Your mouth may get dry. Chewing sugarless gum or sucking hard candy, and drinking plenty of water may help. Contact your doctor if the problem does not go away or is  severe. What side effects may I notice from receiving this medicine? Side effects that you should report to your doctor or health care professional as soon as possible: -blurred vision -breast enlargement in men or women -breast milk in women who are not breast-feeding -chest pain, fast or irregular heartbeat -confusion, restlessness -dark yellow or brown urine -difficulty breathing or swallowing -dizziness or fainting spells -drooling, shaking, movement difficulty (shuffling walk) or rigidity -fever, chills, sore throat -involuntary or uncontrollable movements of the eyes, mouth, head, arms, and legs -seizures -stomach area pain -unusually weak or tired -unusual bleeding or bruising -yellowing of skin or eyes Side effects that usually do not require medical attention (report to your doctor or health care professional if they continue or are bothersome): -difficulty passing urine -difficulty sleeping -headache -sexual dysfunction -skin rash, or itching This list may not describe all possible side effects. Call your doctor for medical advice about side effects. You may report side effects to FDA at 1-800-FDA-1088. Where should I keep my medicine? Keep out of the reach of children. Store at room temperature between 15 and 30 degrees C (59 and 86 degrees F). Protect from light. Throw away any unused medicine after the expiration date. NOTE: This sheet is a summary. It may not cover all possible information. If you have questions about this medicine, talk to  your doctor, pharmacist, or health care provider.  2015, [employer]. (2011-05-12 16:59:39) Ondansetron tablets What is this medicine? ONDANSETRON (on DAN se tron) is used to treat nausea and vomiting caused by chemotherapy. It is also used to prevent or treat nausea and vomiting after surgery. This medicine may be used for other purposes; ask your health care provider or pharmacist if you have questions. COMMON BRAND  NAME(S): Zofran What should I tell my health care provider before I take this medicine? They need to know if you have any of these conditions: -heart disease -history of irregular heartbeat -liver disease -low levels of magnesium or potassium in the blood -an unusual or allergic reaction to ondansetron, granisetron, other medicines, foods, dyes, or preservatives -pregnant or trying to get pregnant -breast-feeding How should I use this medicine? Take this medicine by mouth with a glass of water. Follow the directions on your prescription label. Take your doses at regular intervals. Do not take your medicine more often than directed. Talk to your pediatrician regarding the use of this medicine in children. Special care may be needed. Overdosage: If you think you have taken too much of this medicine contact a poison control center or emergency room at once. NOTE: This medicine is only for you. Do not share this medicine with others. What if I miss a dose? If you miss a dose, take it as soon as you can. If it is almost time for your next dose, take only that dose. Do not take double or extra doses. What may interact with this medicine? Do not take this medicine with any of the following medications: -apomorphine -certain medicines for fungal infections like fluconazole, itraconazole, ketoconazole, posaconazole, voriconazole -cisapride -dofetilide -dronedarone -pimozide -thioridazine -ziprasidone This medicine may also interact with the following medications: -carbamazepine -certain medicines for depression, anxiety, or psychotic disturbances -fentanyl -linezolid -MAOIs like Carbex, Eldepryl, Marplan, Nardil, and Parnate -methylene blue (injected into a vein) -other medicines that prolong the QT interval (cause an abnormal heart rhythm) -phenytoin -rifampicin -tramadol This list may not describe all possible interactions. Give your health care provider a list of all the medicines,  herbs, non-prescription drugs, or dietary supplements you use. Also tell them if you smoke, drink alcohol, or use illegal drugs. Some items may interact with your medicine. What should I watch for while using this medicine? Check with your doctor or health care professional right away if you have any sign of an allergic reaction. What side effects may I notice from receiving this medicine? Side effects that you should report to your doctor or health care professional as soon as possible: -allergic reactions like skin rash, itching or hives, swelling of the face, lips or tongue -breathing problems -confusion -dizziness -fast or irregular heartbeat -feeling faint or lightheaded, falls -fever and chills -loss of balance or coordination -seizures -sweating -swelling of the hands or feet -tightness in the chest -tremors -unusually weak or tired Side effects that usually do not require medical attention (report to your doctor or health care professional if they continue or are bothersome): -constipation or diarrhea -headache This list may not describe all possible side effects. Call your doctor for medical advice about side effects. You may report side effects to FDA at 1-800-FDA-1088. Where should I keep my medicine? Keep out of the reach of children. Store between 2 and 30 degrees C (36 and 86 degrees F). Throw away any unused medicine after the expiration date. NOTE: This sheet is a summary. It may not cover all  possible information. If you have questions about this medicine, talk to your doctor, pharmacist, or health care provider.  2015, Elsevier/Gold Standard. (2012-09-28 16:27:45) Lidocaine; Prilocaine cream What is this medicine? LIDOCAINE; PRILOCAINE (LYE doe kane; PRIL oh kane) is a topical anesthetic that causes loss of feeling in the skin and surrounding tissues. It is used to numb the skin before procedures or injections. This medicine may be used for other purposes; ask your  health care provider or pharmacist if you have questions. COMMON BRAND NAME(S): EMLA What should I tell my health care provider before I take this medicine? They need to know if you have any of these conditions: -glucose-6-phosphate deficiencies -heart disease -kidney or liver disease -methemoglobinemia -an unusual or allergic reaction to lidocaine, prilocaine, other medicines, foods, dyes, or preservatives -pregnant or trying to get pregnant -breast-feeding How should I use this medicine? This medicine is for external use only on the skin. Do not take by mouth. Follow the directions on the prescription label. Wash hands before and after use. Do not use more or leave in contact with the skin longer than directed. Do not apply to eyes or open wounds. It can cause irritation and blurred or temporary loss of vision. If this medicine comes in contact with your eyes, immediately rinse the eye with water. Do not touch or rub the eye. Contact your health care provider right away. Talk to your pediatrician regarding the use of this medicine in children. While this medicine may be prescribed for children for selected conditions, precautions do apply. Overdosage: If you think you have taken too much of this medicine contact a poison control center or emergency room at once. NOTE: This medicine is only for you. Do not share this medicine with others. What if I miss a dose? This medicine is usually only applied once prior to each procedure. It must be in contact with the skin for a period of time for it to work. If you applied this medicine later than directed, tell your health care professional before starting the procedure. What may interact with this medicine? -acetaminophen -chloroquine -dapsone -medicines to control heart rhythm -nitrates like nitroglycerin and nitroprusside -other ointments, creams, or sprays that may contain anesthetic medicine -phenobarbital -phenytoin -quinine -sulfonamides  like sulfacetamide, sulfamethoxazole, sulfasalazine and others This list may not describe all possible interactions. Give your health care provider a list of all the medicines, herbs, non-prescription drugs, or dietary supplements you use. Also tell them if you smoke, drink alcohol, or use illegal drugs. Some items may interact with your medicine. What should I watch for while using this medicine? Be careful to avoid injury to the treated area while it is numb and you are not aware of pain. Avoid scratching, rubbing, or exposing the treated area to hot or cold temperatures until complete sensation has returned. The numb feeling will wear off a few hours after applying the cream. What side effects may I notice from receiving this medicine? Side effects that you should report to your doctor or health care professional as soon as possible: -blurred vision -chest pain -difficulty breathing -dizziness -drowsiness -fast or irregular heartbeat -skin rash or itching -swelling of your throat, lips, or face -trembling Side effects that usually do not require medical attention (report to your doctor or health care professional if they continue or are bothersome): -changes in ability to feel hot or cold -redness and swelling at the application site This list may not describe all possible side effects. Call your doctor for  medical advice about side effects. You may report side effects to FDA at 1-800-FDA-1088. Where should I keep my medicine? Keep out of reach of children. Store at room temperature between 15 and 30 degrees C (59 and 86 degrees F). Keep container tightly closed. Throw away any unused medicine after the expiration date. NOTE: This sheet is a summary. It may not cover all possible information. If you have questions about this medicine, talk to your doctor, pharmacist, or health care provider.  2015, Elsevier/Gold Standard. (2007-06-27 17:14:35)

## 2014-08-03 ENCOUNTER — Ambulatory Visit (HOSPITAL_COMMUNITY): Payer: Medicare Other | Admitting: Hematology & Oncology

## 2014-08-03 ENCOUNTER — Other Ambulatory Visit (HOSPITAL_COMMUNITY): Payer: Self-pay | Admitting: Hematology & Oncology

## 2014-08-04 ENCOUNTER — Other Ambulatory Visit: Payer: Self-pay | Admitting: Radiology

## 2014-08-06 ENCOUNTER — Encounter (HOSPITAL_COMMUNITY): Payer: Self-pay

## 2014-08-06 ENCOUNTER — Ambulatory Visit (HOSPITAL_COMMUNITY)
Admission: RE | Admit: 2014-08-06 | Discharge: 2014-08-06 | Disposition: A | Discharge status: Home / Self Care | Payer: Medicare Other | Source: Ambulatory Visit | Attending: Hematology & Oncology | Admitting: Hematology & Oncology

## 2014-08-06 ENCOUNTER — Other Ambulatory Visit (HOSPITAL_COMMUNITY): Payer: Self-pay | Admitting: Hematology & Oncology

## 2014-08-06 DIAGNOSIS — C541 Malignant neoplasm of endometrium: Secondary | ICD-10-CM | POA: Insufficient documentation

## 2014-08-06 DIAGNOSIS — M25512 Pain in left shoulder: Secondary | ICD-10-CM | POA: Insufficient documentation

## 2014-08-06 DIAGNOSIS — R918 Other nonspecific abnormal finding of lung field: Secondary | ICD-10-CM

## 2014-08-06 DIAGNOSIS — R05 Cough: Secondary | ICD-10-CM | POA: Diagnosis not present

## 2014-08-06 DIAGNOSIS — Z853 Personal history of malignant neoplasm of breast: Secondary | ICD-10-CM | POA: Diagnosis not present

## 2014-08-06 HISTORY — PX: PORTACATH PLACEMENT: SHX2246

## 2014-08-06 LAB — CBC WITH DIFFERENTIAL/PLATELET
BASOS ABS: 0 10*3/uL (ref 0.0–0.1)
BASOS PCT: 0 % (ref 0–1)
EOS ABS: 0 10*3/uL (ref 0.0–0.7)
EOS PCT: 0 % (ref 0–5)
HCT: 35 % — ABNORMAL LOW (ref 36.0–46.0)
Hemoglobin: 11.9 g/dL — ABNORMAL LOW (ref 12.0–15.0)
LYMPHS PCT: 13 % (ref 12–46)
Lymphs Abs: 1.1 10*3/uL (ref 0.7–4.0)
MCH: 33.7 pg (ref 26.0–34.0)
MCHC: 34 g/dL (ref 30.0–36.0)
MCV: 99.2 fL (ref 78.0–100.0)
MONO ABS: 0.6 10*3/uL (ref 0.1–1.0)
Monocytes Relative: 7 % (ref 3–12)
Neutro Abs: 6.9 10*3/uL (ref 1.7–7.7)
Neutrophils Relative %: 80 % — ABNORMAL HIGH (ref 43–77)
Platelets: 282 10*3/uL (ref 150–400)
RBC: 3.53 MIL/uL — ABNORMAL LOW (ref 3.87–5.11)
RDW: 14.5 % (ref 11.5–15.5)
WBC: 8.6 10*3/uL (ref 4.0–10.5)

## 2014-08-06 LAB — PROTIME-INR
INR: 1.07 (ref 0.00–1.49)
Prothrombin Time: 14.1 seconds (ref 11.6–15.2)

## 2014-08-06 MED ORDER — FENTANYL CITRATE (PF) 100 MCG/2ML IJ SOLN
INTRAMUSCULAR | Status: AC
  Filled 2014-08-06: qty 4

## 2014-08-06 MED ORDER — SODIUM CHLORIDE 0.9 % IV SOLN
INTRAVENOUS | Status: DC
  Administered 2014-08-06: 12:00:00 via INTRAVENOUS

## 2014-08-06 MED ORDER — FENTANYL CITRATE (PF) 100 MCG/2ML IJ SOLN
INTRAMUSCULAR | Status: DC | PRN
  Administered 2014-08-06: 50 ug via INTRAVENOUS

## 2014-08-06 MED ORDER — MIDAZOLAM HCL 5 MG/5ML IJ SOLN
INTRAMUSCULAR | Status: DC | PRN
Start: 2014-08-06 — End: 2014-08-07
  Administered 2014-08-06: 0.5 mg via INTRAVENOUS

## 2014-08-06 MED ORDER — HEPARIN SOD (PORK) LOCK FLUSH 100 UNIT/ML IV SOLN
INTRAVENOUS | Status: AC
  Filled 2014-08-06: qty 5

## 2014-08-06 MED ORDER — LIDOCAINE HCL 1 % IJ SOLN
INTRAMUSCULAR | Status: AC
  Filled 2014-08-06: qty 20

## 2014-08-06 MED ORDER — CEFAZOLIN SODIUM-DEXTROSE 2-3 GM-% IV SOLR
2.0000 g | INTRAVENOUS | Status: AC
  Administered 2014-08-06: 2 g via INTRAVENOUS

## 2014-08-06 MED ORDER — MIDAZOLAM HCL 2 MG/2ML IJ SOLN
INTRAMUSCULAR | Status: DC | PRN
  Administered 2014-08-06: 1 mg via INTRAVENOUS
  Administered 2014-08-06 (×3): 0.5 mg via INTRAVENOUS

## 2014-08-06 MED ORDER — LIDOCAINE-EPINEPHRINE 2 %-1:100000 IJ SOLN
INTRAMUSCULAR | Status: AC
  Filled 2014-08-06: qty 1

## 2014-08-06 MED ORDER — MIDAZOLAM HCL 2 MG/2ML IJ SOLN
INTRAMUSCULAR | Status: AC
  Filled 2014-08-06: qty 6

## 2014-08-06 MED ORDER — CEFAZOLIN SODIUM-DEXTROSE 2-3 GM-% IV SOLR
INTRAVENOUS | Status: AC
  Filled 2014-08-06: qty 50

## 2014-08-06 MED ORDER — HEPARIN SOD (PORK) LOCK FLUSH 100 UNIT/ML IV SOLN
INTRAVENOUS | Status: DC | PRN
  Administered 2014-08-06: 500 [IU]

## 2014-08-06 NOTE — Procedures (Signed)
Successful RT IJ POWER PORT TIP SVC/RA NO COMP STABLE READY FOR USE FULL REPORT IN PACS  

## 2014-08-06 NOTE — Discharge Instructions (Signed)
Implanted Port Insertion, Care After °Refer to this sheet in the next few weeks. These instructions provide you with information on caring for yourself after your procedure. Your health care provider may also give you more specific instructions. Your treatment has been planned according to current medical practices, but problems sometimes occur. Call your health care provider if you have any problems or questions after your procedure. °WHAT TO EXPECT AFTER THE PROCEDURE °After your procedure, it is typical to have the following:  °· Discomfort at the port insertion site. Ice packs to the area will help. °· Bruising on the skin over the port. This will subside in 3-4 days. °HOME CARE INSTRUCTIONS °· After your port is placed, you will get a manufacturer's information card. The card has information about your port. Keep this card with you at all times.   °· Know what kind of port you have. There are many types of ports available.   °· Wear a medical alert bracelet in case of an emergency. This can help alert health care workers that you have a port.   °· The port can stay in for as long as your health care provider believes it is necessary.   °· A home health care nurse may give medicines and take care of the port.   °· You or a family member can get special training and directions for giving medicine and taking care of the port at home.   °SEEK MEDICAL CARE IF:  °· Your port does not flush or you are unable to get a blood return.   °· You have a fever or chills. °SEEK IMMEDIATE MEDICAL CARE IF: °· You have new fluid or pus coming from your incision.   °· You notice a bad smell coming from your incision site.   °· You have swelling, pain, or more redness at the incision or port site.   °· You have chest pain or shortness of breath. °Document Released: 10/12/2012 Document Revised: 12/27/2012 Document Reviewed: 10/12/2012 °ExitCare® Patient Information ©2015 [employer]. This information is not intended to replace  advice given to you by your health care provider. Make sure you discuss any questions you have with your health care provider. °Implanted Port Home Guide °An implanted port is a type of central line that is placed under the skin. Central lines are used to provide IV access when treatment or nutrition needs to be given through a person's veins. Implanted ports are used for long-term IV access. An implanted port may be placed because:  °· You need IV medicine that would be irritating to the small veins in your hands or arms.   °· You need long-term IV medicines, such as antibiotics.   °· You need IV nutrition for a long period.   °· You need frequent blood draws for lab tests.   °· You need dialysis.   °Implanted ports are usually placed in the chest area, but they can also be placed in the upper arm, the abdomen, or the leg. An implanted port has two main parts:  °· Reservoir. The reservoir is round and will appear as a small, raised area under your skin. The reservoir is the part where a needle is inserted to give medicines or draw blood.   °· Catheter. The catheter is a thin, flexible tube that extends from the reservoir. The catheter is placed into a large vein. Medicine that is inserted into the reservoir goes into the catheter and then into the vein.   °HOW WILL I CARE FOR MY INCISION SITE? °Do not get the incision site wet. Bathe or   shower as directed by your health care provider.  HOW IS MY PORT ACCESSED? Special steps must be taken to access the port:   Before the port is accessed, a numbing cream can be placed on the skin. This helps numb the skin over the port site.   Your health care provider uses a sterile technique to access the port.  Your health care provider must put on a mask and sterile gloves.  The skin over your port is cleaned carefully with an antiseptic and allowed to dry.  The port is gently pinched between sterile gloves, and a needle is inserted into the port.  Only  "non-coring" port needles should be used to access the port. Once the port is accessed, a blood return should be checked. This helps ensure that the port is in the vein and is not clogged.   If your port needs to remain accessed for a constant infusion, a clear (transparent) bandage will be placed over the needle site. The bandage and needle will need to be changed every week, or as directed by your health care provider.   Keep the bandage covering the needle clean and dry. Do not get it wet. Follow your health care provider's instructions on how to take a shower or bath while the port is accessed.   If your port does not need to stay accessed, no bandage is needed over the port.  WHAT IS FLUSHING? Flushing helps keep the port from getting clogged. Follow your health care provider's instructions on how and when to flush the port. Ports are usually flushed with saline solution or a medicine called heparin. The need for flushing will depend on how the port is used.   If the port is used for intermittent medicines or blood draws, the port will need to be flushed:   After medicines have been given.   After blood has been drawn.   As part of routine maintenance.   If a constant infusion is running, the port may not need to be flushed.  HOW LONG WILL MY PORT STAY IMPLANTED? The port can stay in for as long as your health care provider thinks it is needed. When it is time for the port to come out, surgery will be done to remove it. The procedure is similar to the one performed when the port was put in.  WHEN SHOULD I SEEK IMMEDIATE MEDICAL CARE? When you have an implanted port, you should seek immediate medical care if:   You notice a bad smell coming from the incision site.   You have swelling, redness, or drainage at the incision site.   You have more swelling or pain at the port site or the surrounding area.   You have a fever that is not controlled with medicine. Document  Released: 12/22/2004 Document Revised: 10/12/2012 Document Reviewed: 08/29/2012 [facility] Patient Information 2015 [place], [place]. This information is not intended to replace advice given to you by your health care provider. Make sure you discuss any questions you have with your health care provider.   May remove dressing and shower or bathe 24 to 48 hours post procedure.  Keep wound clean and dry.  Conscious Sedation Sedation is the use of medicines to promote relaxation and relieve discomfort and anxiety. Conscious sedation is a type of sedation. Under conscious sedation you are less alert than normal but are still able to respond to instructions or stimulation. Conscious sedation is used during short medical and dental procedures. It is milder  than deep sedation or general anesthesia and allows you to return to your regular activities sooner.  LET [facility] CARE PROVIDER KNOW ABOUT:   Any allergies you have.  All medicines you are taking, including vitamins, herbs, eye drops, creams, and over-the-counter medicines.  Use of steroids (by mouth or creams).  Previous problems you or members of your family have had with the use of anesthetics.  Any blood disorders you have.  Previous surgeries you have had.  Medical conditions you have.  Possibility of pregnancy, if this applies.  Use of cigarettes, alcohol, or illegal drugs. RISKS AND COMPLICATIONS Generally, this is a safe procedure. However, as with any procedure, problems can occur. Possible problems include:  Oversedation.  Trouble breathing on your own. You may need to have a breathing tube until you are awake and breathing on your own.  Allergic reaction to any of the medicines used for the procedure. BEFORE THE PROCEDURE  You may have blood tests done. These tests can help show how well your kidneys and liver are working. They can also show how well your blood clots.  A physical exam will be done.  Only take  medicines as directed by your health care provider. You may need to stop taking medicines (such as blood thinners, aspirin, or nonsteroidal anti-inflammatory drugs) before the procedure.   Do not eat or drink at least 6 hours before the procedure or as directed by your health care provider.  Arrange for a responsible adult, family member, or friend to take you home after the procedure. He or she should stay with you for at least 24 hours after the procedure, until the medicine has worn off. PROCEDURE   An intravenous (IV) catheter will be inserted into one of your veins. Medicine will be able to flow directly into your body through this catheter. You may be given medicine through this tube to help prevent pain and help you relax.  The medical or dental procedure will be done. AFTER THE PROCEDURE  You will stay in a recovery area until the medicine has worn off. Your blood pressure and pulse will be checked.   Depending on the procedure you had, you may be allowed to go home when you can tolerate liquids and your pain is under control. Document Released: 09/16/2000 Document Revised: 12/27/2012 Document Reviewed: 08/29/2012 [facility] Patient Information 2015 [place], [place]. This information is not intended to replace advice given to you by your health care provider. Make sure you discuss any questions you have with your health care provider.

## 2014-08-06 NOTE — Progress Notes (Signed)
Patient ID: Alejandra Callahan, female   DOB: 09/25/1945, 69 y.o.   MRN: [id number]    Referring Physician(s): [name],[name]  Chief Complaint:  Metastatic endometrial cancer Subjective:  Pt familiar to IR service from recent right lung mass biopsy on 07/24/2014 which revealed findings c/w metastatic endometrial cancer. She presents today for port a cath placement for chemotherapy. She also has remote history of left breast cancer. She does have intermittent left shoulder pain and occ dry cough. She denies CP, dyspnea, abd pain,N/V, or abnormal bleeding.   Allergies: Codeine; Acyclovir and related; Other; Tuberculin ppd; and Tylox  Medications: Prior to Admission medications   Medication Sig Start Date End Date Taking? Authorizing Provider  acetaminophen (TYLENOL) 500 MG tablet Take 500 mg by mouth every 6 (six) hours as needed for moderate pain.    Yes Historical Provider, MD  acyclovir (ZOVIRAX) 400 MG tablet Take 1 tablet (400 mg total) by mouth 2 (two) times daily. 07/31/14  Yes Patrici Ranks, MD  alendronate (FOSAMAX) 10 MG tablet Take 70 mg by mouth once a week. Monday   Yes Historical Provider, MD  Calcium-Vitamin D (CALTRATE 600 PLUS-VIT D PO) Take 1 each by mouth 2 (two) times daily.    Yes Historical Provider, MD  cholecalciferol (VITAMIN D) 1000 UNITS tablet Take 1,000 Units by mouth daily.   Yes Historical Provider, MD  dexamethasone (DECADRON) 4 MG tablet Day before chemo: take 5 tablets ('20mg'$ ) in the am and 5 tablets ('20mg'$ ) in the pm. Morning of chemo take 5 tablets ('20mg'$ ). 08/01/14  Yes Patrici Ranks, MD  folic acid (FOLVITE) 735 MCG tablet Take 800 mcg by mouth daily.    Yes Historical Provider, MD  hydroxychloroquine (PLAQUENIL) 200 MG tablet Take by mouth daily.   Yes Historical Provider, MD  lidocaine-prilocaine (EMLA) cream Apply a quarter size amount to port site 1 hour prior to chemo. Do not rub in. Cover with plastic wrap. 08/01/14  Yes Patrici Ranks, MD    methotrexate (RHEUMATREX) 2.5 MG tablet Take 2.5 mg by mouth 2 (two) times a week. Caution:Chemotherapy. Protect from light.  Pt to take 4 tablets on Friday and 4 tablets on Saturday   Yes Historical Provider, MD  Multiple Vitamin (MULTIVITAMIN) tablet Take 1 tablet by mouth daily.   Yes Historical Provider, MD  ondansetron (ZOFRAN) 8 MG tablet Take 1 tablet (8 mg total) by mouth every 8 (eight) hours as needed for nausea or vomiting. 08/01/14  Yes Patrici Ranks, MD  predniSONE (DELTASONE) 5 MG tablet Take 5 mg by mouth daily as needed (flare up).    Yes Historical Provider, MD  prochlorperazine (COMPAZINE) 10 MG tablet Take 1 tablet (10 mg total) by mouth every 6 (six) hours as needed (Nausea or vomiting). 08/01/14  Yes Patrici Ranks, MD  raloxifene (EVISTA) 60 MG tablet Take 60 mg by mouth daily.   Yes Historical Provider, MD  CARBOPLATIN IV Inject into the vein every 21 ( twenty-one) days. To start 08/08/14    Historical Provider, MD  PACLitaxel (TAXOL) 300 MG/50ML injection Inject into the vein every 21 ( twenty-one) days. To start 08/08/14    Historical Provider, MD  Pegfilgrastim (NEULASTA ONPRO Driscoll) Inject into the skin every 21 ( twenty-one) days. To be given 27 hours after chemo (starting 08/09/14)    Historical Provider, MD     Vital Signs: BP 102/53 mmHg  Pulse 80  Temp(Src) 98.1 F (36.7 C) (Oral)  Resp 20  SpO2 99%  Physical Exam pt awake/alert; chest- BS clear rt, more distant on left; heart- RRR; abd- soft,+BS,NT; ext- no edema, mild erythema lower legs secondary to recent drug rxn  Imaging: No results found.  Labs:  CBC:  Recent Labs  07/24/14 0806 08/06/14 1150  WBC 7.2 8.6  HGB 12.4 11.9*  HCT 36.4 35.0*  PLT 228 282    COAGS:  Recent Labs  07/24/14 0806 08/06/14 1150  INR 0.97 1.07    BMP:  Recent Labs  07/27/14 1010  NA 136  K 3.7  CL 100*  CO2 29  GLUCOSE 86  BUN 16  CALCIUM 9.3  CREATININE 0.61  GFRNONAA >60  GFRAA >60    LIVER  FUNCTION TESTS:  Recent Labs  07/27/14 1010  BILITOT 0.3  AST 26  ALT 21  ALKPHOS 53  PROT 6.7  ALBUMIN 3.8    Assessment and Plan: Pt with hx metastatic endometrial cancer; plan is for port a cath placement today for chemotherapy. Risks and benefitsdiscussed with the patient including, but not limited to bleeding, infection, pneumothorax, or fibrin sheath development and need for additional procedures.All of the patient's questions were answered, patient is agreeable to proceed.Consent signed and in chart.       Signed: D. Rowe Robert 08/06/2014, 1:16 PM   I spent a total of 15 minuteso face in clinical consultation/evaluation, greater than 50% of which was counseling/coordinating care for port a cath placement

## 2014-08-07 ENCOUNTER — Telehealth (HOSPITAL_COMMUNITY): Payer: Self-pay

## 2014-08-07 NOTE — Telephone Encounter (Signed)
Patient states " I started the Acyclovir and on Thursday morning I noticed a rash and swelling of my right ankle.  By Saturday the rash and moved up my right leg and I was feeling very bad, was tired and weak.  The only new thing was the acyclovir and after I read the side effects, I stopped the medication because I was having a lot of the side effects that were listed. By Sunday afternoon and Monday morning I was feeling much better.  Don't think I can tolerate that medication and will just have to hope for the best.  Wanted Dr. Whitney Muse to be aware that I stopped the medication."

## 2014-08-08 ENCOUNTER — Encounter (HOSPITAL_COMMUNITY): Payer: Medicare Other | Attending: Hematology & Oncology

## 2014-08-08 ENCOUNTER — Ambulatory Visit (HOSPITAL_COMMUNITY): Payer: Medicare Other | Admitting: Hematology & Oncology

## 2014-08-08 ENCOUNTER — Other Ambulatory Visit (HOSPITAL_COMMUNITY): Payer: Self-pay | Admitting: *Deleted

## 2014-08-08 DIAGNOSIS — Z853 Personal history of malignant neoplasm of breast: Secondary | ICD-10-CM | POA: Insufficient documentation

## 2014-08-08 DIAGNOSIS — C7801 Secondary malignant neoplasm of right lung: Secondary | ICD-10-CM | POA: Diagnosis not present

## 2014-08-08 DIAGNOSIS — R918 Other nonspecific abnormal finding of lung field: Secondary | ICD-10-CM | POA: Insufficient documentation

## 2014-08-08 DIAGNOSIS — C541 Malignant neoplasm of endometrium: Secondary | ICD-10-CM | POA: Diagnosis not present

## 2014-08-08 DIAGNOSIS — Z79899 Other long term (current) drug therapy: Secondary | ICD-10-CM | POA: Insufficient documentation

## 2014-08-08 DIAGNOSIS — M069 Rheumatoid arthritis, unspecified: Secondary | ICD-10-CM | POA: Insufficient documentation

## 2014-08-08 MED ORDER — ZOLPIDEM TARTRATE 10 MG PO TABS: ORAL_TABLET | ORAL | Status: DC

## 2014-08-08 NOTE — Progress Notes (Signed)
Chemo teacing done and consent signed for Carboplatin and Taxol.

## 2014-08-09 ENCOUNTER — Encounter (HOSPITAL_BASED_OUTPATIENT_CLINIC_OR_DEPARTMENT_OTHER): Payer: Medicare Other

## 2014-08-09 ENCOUNTER — Encounter (HOSPITAL_COMMUNITY): Payer: Self-pay

## 2014-08-09 VITALS — BP 128/65 | HR 91 | Temp 98.1°F | Resp 16 | Wt 115.0 lb

## 2014-08-09 DIAGNOSIS — Z5111 Encounter for antineoplastic chemotherapy: Secondary | ICD-10-CM | POA: Diagnosis not present

## 2014-08-09 DIAGNOSIS — Z853 Personal history of malignant neoplasm of breast: Secondary | ICD-10-CM | POA: Diagnosis not present

## 2014-08-09 DIAGNOSIS — C7801 Secondary malignant neoplasm of right lung: Secondary | ICD-10-CM

## 2014-08-09 DIAGNOSIS — C541 Malignant neoplasm of endometrium: Secondary | ICD-10-CM

## 2014-08-09 DIAGNOSIS — R918 Other nonspecific abnormal finding of lung field: Secondary | ICD-10-CM

## 2014-08-09 DIAGNOSIS — Z79899 Other long term (current) drug therapy: Secondary | ICD-10-CM | POA: Diagnosis not present

## 2014-08-09 DIAGNOSIS — M069 Rheumatoid arthritis, unspecified: Secondary | ICD-10-CM | POA: Diagnosis not present

## 2014-08-09 LAB — COMPREHENSIVE METABOLIC PANEL
ALT: 17 U/L (ref 14–54)
AST: 27 U/L (ref 15–41)
Albumin: 3.4 g/dL — ABNORMAL LOW (ref 3.5–5.0)
Alkaline Phosphatase: 57 U/L (ref 38–126)
Anion gap: 8 (ref 5–15)
BILIRUBIN TOTAL: 0.3 mg/dL (ref 0.3–1.2)
BUN: 18 mg/dL (ref 6–20)
CALCIUM: 9.3 mg/dL (ref 8.9–10.3)
CHLORIDE: 99 mmol/L — AB (ref 101–111)
CO2: 26 mmol/L (ref 22–32)
Creatinine, Ser: 0.56 mg/dL (ref 0.44–1.00)
GFR calc non Af Amer: >60 mL/min (ref >60)
GLUCOSE: 146 mg/dL — AB (ref 65–99)
POTASSIUM: 3.9 mmol/L (ref 3.5–5.1)
SODIUM: 133 mmol/L — AB (ref 135–145)
Total Protein: 6.4 g/dL — ABNORMAL LOW (ref 6.5–8.1)

## 2014-08-09 LAB — CBC WITH DIFFERENTIAL/PLATELET
BASOS ABS: 0 10*3/uL (ref 0.0–0.1)
BASOS PCT: 0 % (ref 0–1)
EOS PCT: 0 % (ref 0–5)
Eosinophils Absolute: 0 10*3/uL (ref 0.0–0.7)
HCT: 34.9 % — ABNORMAL LOW (ref 36.0–46.0)
Hemoglobin: 11.7 g/dL — ABNORMAL LOW (ref 12.0–15.0)
Lymphocytes Relative: 6 % — ABNORMAL LOW (ref 12–46)
Lymphs Abs: 0.8 10*3/uL (ref 0.7–4.0)
MCH: 33.1 pg (ref 26.0–34.0)
MCHC: 33.5 g/dL (ref 30.0–36.0)
MCV: 98.9 fL (ref 78.0–100.0)
MONO ABS: 0.9 10*3/uL (ref 0.1–1.0)
MONOS PCT: 6 % (ref 3–12)
NEUTROS PCT: 88 % — AB (ref 43–77)
Neutro Abs: 12.5 10*3/uL — ABNORMAL HIGH (ref 1.7–7.7)
PLATELETS: 342 10*3/uL (ref 150–400)
RBC: 3.53 MIL/uL — AB (ref 3.87–5.11)
RDW: 14.6 % (ref 11.5–15.5)
WBC: 14.2 10*3/uL — ABNORMAL HIGH (ref 4.0–10.5)

## 2014-08-09 MED ORDER — PEGFILGRASTIM 6 MG/0.6ML ~~LOC~~ PSKT
6.0000 mg | PREFILLED_SYRINGE | Freq: Once | SUBCUTANEOUS | Status: AC
  Administered 2014-08-09: 6 mg via SUBCUTANEOUS
  Filled 2014-08-09: qty 0.6

## 2014-08-09 MED ORDER — FAMOTIDINE IN NACL 20-0.9 MG/50ML-% IV SOLN
20.0000 mg | Freq: Once | INTRAVENOUS | Status: AC
  Administered 2014-08-09: 20 mg via INTRAVENOUS

## 2014-08-09 MED ORDER — SODIUM CHLORIDE 0.9 % IV SOLN
Freq: Once | INTRAVENOUS | Status: AC
  Administered 2014-08-09: 10:00:00 via INTRAVENOUS

## 2014-08-09 MED ORDER — SODIUM CHLORIDE 0.9 % IV SOLN
347.5000 mg | Freq: Once | INTRAVENOUS | Status: AC
  Administered 2014-08-09: 350 mg via INTRAVENOUS
  Filled 2014-08-09: qty 35

## 2014-08-09 MED ORDER — FAMOTIDINE IN NACL 20-0.9 MG/50ML-% IV SOLN
INTRAVENOUS | Status: AC
  Filled 2014-08-09: qty 50

## 2014-08-09 MED ORDER — SODIUM CHLORIDE 0.9 % IJ SOLN
10.0000 mL | INTRAMUSCULAR | Status: DC | PRN
  Administered 2014-08-09: 10 mL
  Filled 2014-08-09: qty 10

## 2014-08-09 MED ORDER — DIPHENHYDRAMINE HCL 50 MG/ML IJ SOLN: 50.0000 mg | Freq: Once | INTRAMUSCULAR | Status: DC

## 2014-08-09 MED ORDER — PACLITAXEL CHEMO INJECTION 300 MG/50ML
175.0000 mg/m2 | Freq: Once | INTRAVENOUS | Status: AC
  Administered 2014-08-09: 258 mg via INTRAVENOUS
  Filled 2014-08-09: qty 43

## 2014-08-09 MED ORDER — SODIUM CHLORIDE 0.9 % IV SOLN
Freq: Once | INTRAVENOUS | Status: AC
  Administered 2014-08-09: 10:00:00 via INTRAVENOUS
  Filled 2014-08-09: qty 8

## 2014-08-09 MED ORDER — HEPARIN SOD (PORK) LOCK FLUSH 100 UNIT/ML IV SOLN
500.0000 [IU] | Freq: Once | INTRAVENOUS | Status: AC | PRN
  Administered 2014-08-09: 500 [IU]

## 2014-08-09 MED ORDER — DIPHENHYDRAMINE HCL 50 MG/ML IJ SOLN
25.0000 mg | Freq: Once | INTRAMUSCULAR | Status: AC
  Administered 2014-08-09: 25 mg via INTRAVENOUS
  Filled 2014-08-09: qty 1

## 2014-08-09 MED ORDER — HEPARIN SOD (PORK) LOCK FLUSH 100 UNIT/ML IV SOLN
INTRAVENOUS | Status: AC
  Filled 2014-08-09: qty 5

## 2014-08-09 NOTE — Patient Instructions (Signed)
[facility] Discharge Instructions for Patients Receiving Chemotherapy  Today you received the following chemotherapy agents:  Taxol and carboplatin  To help prevent nausea and vomiting after your treatment, we encourage you to take your nausea medication as prescribed at the first sign of nausea.   If you develop nausea and vomiting, or diarrhea that is not controlled by your medication, call the clinic.  The clinic phone number is [phone]. Office hours are Monday-Friday 8:30am-5:00pm.  BELOW ARE SYMPTOMS THAT SHOULD BE REPORTED IMMEDIATELY:  *FEVER GREATER THAN 101.0 F  *CHILLS WITH OR WITHOUT FEVER  NAUSEA AND VOMITING THAT IS NOT CONTROLLED WITH YOUR NAUSEA MEDICATION  *UNUSUAL SHORTNESS OF BREATH  *UNUSUAL BRUISING OR BLEEDING  TENDERNESS IN MOUTH AND THROAT WITH OR WITHOUT PRESENCE OF ULCERS  *URINARY PROBLEMS  *BOWEL PROBLEMS  UNUSUAL RASH Items with * indicate a potential emergency and should be followed up as soon as possible. If you have an emergency after office hours please contact your primary care physician or go to the nearest emergency department.  Please call the clinic during office hours if you have any questions or concerns.   You may also contact the Patient Navigator at [phone] should you have any questions or need assistance in obtaining follow up care. _____________________________________________________________________ Have you asked about our STAR program?    STAR stands for Survivorship Training and Rehabilitation, and this is a nationally recognized cancer care program that focuses on survivorship and rehabilitation.  Cancer and cancer treatments may cause problems, such as, pain, making you feel tired and keeping you from doing the things that you need or want to do. Cancer rehabilitation can help. Our goal is to reduce these troubling effects and help you have the best quality of life possible.  You may receive a  survey from a nurse that asks questions about your current state of health.  Based on the survey results, all eligible patients will be referred to the [facility] program for an evaluation so we can better serve you! A frequently asked questions sheet is available upon request.          Paclitaxel injection What is this medicine? PACLITAXEL (PAK li TAX el) is a chemotherapy drug. It targets fast dividing cells, like cancer cells, and causes these cells to die. This medicine is used to treat ovarian cancer, breast cancer, and other cancers. This medicine may be used for other purposes; ask your health care provider or pharmacist if you have questions. COMMON BRAND NAME(S): Onxol, Taxol What should I tell my health care provider before I take this medicine? They need to know if you have any of these conditions: -blood disorders -irregular heartbeat -infection (especially a virus infection such as chickenpox, cold sores, or herpes) -liver disease -previous or ongoing radiation therapy -an unusual or allergic reaction to paclitaxel, alcohol, polyoxyethylated castor oil, other chemotherapy agents, other medicines, foods, dyes, or preservatives -pregnant or trying to get pregnant -breast-feeding How should I use this medicine? This drug is given as an infusion into a vein. It is administered in a hospital or clinic by a specially trained health care professional. Talk to your pediatrician regarding the use of this medicine in children. Special care may be needed. Overdosage: If you think you have taken too much of this medicine contact a poison control center or emergency room at once. NOTE: This medicine is only for you. Do not share this medicine with others. What if I miss a dose? It is  important not to miss your dose. Call your doctor or health care professional if you are unable to keep an appointment. What may interact with this medicine? Do not take this medicine with any of the following  medications: -disulfiram -metronidazole This medicine may also interact with the following medications: -cyclosporine -diazepam -ketoconazole -medicines to increase blood counts like filgrastim, pegfilgrastim, sargramostim -other chemotherapy drugs like cisplatin, doxorubicin, epirubicin, etoposide, teniposide, vincristine -quinidine -testosterone -vaccines -verapamil Talk to your doctor or health care professional before taking any of these medicines: -acetaminophen -aspirin -ibuprofen -ketoprofen -naproxen This list may not describe all possible interactions. Give your health care provider a list of all the medicines, herbs, non-prescription drugs, or dietary supplements you use. Also tell them if you smoke, drink alcohol, or use illegal drugs. Some items may interact with your medicine. What should I watch for while using this medicine? Your condition will be monitored carefully while you are receiving this medicine. You will need important blood work done while you are taking this medicine. This drug may make you feel generally unwell. This is not uncommon, as chemotherapy can affect healthy cells as well as cancer cells. Report any side effects. Continue your course of treatment even though you feel ill unless your doctor tells you to stop. In some cases, you may be given additional medicines to help with side effects. Follow all directions for their use. Call your doctor or health care professional for advice if you get a fever, chills or sore throat, or other symptoms of a cold or flu. Do not treat yourself. This drug decreases your body's ability to fight infections. Try to avoid being around people who are sick. This medicine may increase your risk to bruise or bleed. Call your doctor or health care professional if you notice any unusual bleeding. Be careful brushing and flossing your teeth or using a toothpick because you may get an infection or bleed more easily. If you have any  dental work done, tell your dentist you are receiving this medicine. Avoid taking products that contain aspirin, acetaminophen, ibuprofen, naproxen, or ketoprofen unless instructed by your doctor. These medicines may hide a fever. Do not become pregnant while taking this medicine. Women should inform their doctor if they wish to become pregnant or think they might be pregnant. There is a potential for serious side effects to an unborn child. Talk to your health care professional or pharmacist for more information. Do not breast-feed an infant while taking this medicine. Men are advised not to father a child while receiving this medicine. What side effects may I notice from receiving this medicine? Side effects that you should report to your doctor or health care professional as soon as possible: -allergic reactions like skin rash, itching or hives, swelling of the face, lips, or tongue -low blood counts - This drug may decrease the number of white blood cells, red blood cells and platelets. You may be at increased risk for infections and bleeding. -signs of infection - fever or chills, cough, sore throat, pain or difficulty passing urine -signs of decreased platelets or bleeding - bruising, pinpoint red spots on the skin, black, tarry stools, nosebleeds -signs of decreased red blood cells - unusually weak or tired, fainting spells, lightheadedness -breathing problems -chest pain -high or low blood pressure -mouth sores -nausea and vomiting -pain, swelling, redness or irritation at the injection site -pain, tingling, numbness in the hands or feet -slow or irregular heartbeat -swelling of the ankle, feet, hands Side effects that  usually do not require medical attention (report to your doctor or health care professional if they continue or are bothersome): -bone pain -complete hair loss including hair on your head, underarms, pubic hair, eyebrows, and eyelashes -changes in the color of  fingernails -diarrhea -loosening of the fingernails -loss of appetite -muscle or joint pain -red flush to skin -sweating This list may not describe all possible side effects. Call your doctor for medical advice about side effects. You may report side effects to FDA at 1-800-FDA-1088. Where should I keep my medicine? This drug is given in a hospital or clinic and will not be stored at home. NOTE: This sheet is a summary. It may not cover all possible information. If you have questions about this medicine, talk to your doctor, pharmacist, or health care provider.  2015, [employer]vier/Gold Standard. (2012-02-15 16:41:21) Carboplatin injection What is this medicine? CARBOPLATIN (KAR boe pla tin) is a chemotherapy drug. It targets fast dividing cells, like cancer cells, and causes these cells to die. This medicine is used to treat ovarian cancer and many other cancers. This medicine may be used for other purposes; ask your health care provider or pharmacist if you have questions. COMMON BRAND NAME(S): Paraplatin What should I tell my health care provider before I take this medicine? They need to know if you have any of these conditions: -blood disorders -hearing problems -kidney disease -recent or ongoing radiation therapy -an unusual or allergic reaction to carboplatin, cisplatin, other chemotherapy, other medicines, foods, dyes, or preservatives -pregnant or trying to get pregnant -breast-feeding How should I use this medicine? This drug is usually given as an infusion into a vein. It is administered in a hospital or clinic by a specially trained health care professional. Talk to your pediatrician regarding the use of this medicine in children. Special care may be needed. Overdosage: If you think you have taken too much of this medicine contact a poison control center or emergency room at once. NOTE: This medicine is only for you. Do not share this medicine with others. What if I miss a  dose? It is important not to miss a dose. Call your doctor or health care professional if you are unable to keep an appointment. What may interact with this medicine? -medicines for seizures -medicines to increase blood counts like filgrastim, pegfilgrastim, sargramostim -some antibiotics like amikacin, gentamicin, neomycin, streptomycin, tobramycin -vaccines Talk to your doctor or health care professional before taking any of these medicines: -acetaminophen -aspirin -ibuprofen -ketoprofen -naproxen This list may not describe all possible interactions. Give your health care provider a list of all the medicines, herbs, non-prescription drugs, or dietary supplements you use. Also tell them if you smoke, drink alcohol, or use illegal drugs. Some items may interact with your medicine. What should I watch for while using this medicine? Your condition will be monitored carefully while you are receiving this medicine. You will need important blood work done while you are taking this medicine. This drug may make you feel generally unwell. This is not uncommon, as chemotherapy can affect healthy cells as well as cancer cells. Report any side effects. Continue your course of treatment even though you feel ill unless your doctor tells you to stop. In some cases, you may be given additional medicines to help with side effects. Follow all directions for their use. Call your doctor or health care professional for advice if you get a fever, chills or sore throat, or other symptoms of a cold or flu. Do not treat  yourself. This drug decreases your body's ability to fight infections. Try to avoid being around people who are sick. This medicine may increase your risk to bruise or bleed. Call your doctor or health care professional if you notice any unusual bleeding. Be careful brushing and flossing your teeth or using a toothpick because you may get an infection or bleed more easily. If you have any dental work  done, tell your dentist you are receiving this medicine. Avoid taking products that contain aspirin, acetaminophen, ibuprofen, naproxen, or ketoprofen unless instructed by your doctor. These medicines may hide a fever. Do not become pregnant while taking this medicine. Women should inform their doctor if they wish to become pregnant or think they might be pregnant. There is a potential for serious side effects to an unborn child. Talk to your health care professional or pharmacist for more information. Do not breast-feed an infant while taking this medicine. What side effects may I notice from receiving this medicine? Side effects that you should report to your doctor or health care professional as soon as possible: -allergic reactions like skin rash, itching or hives, swelling of the face, lips, or tongue -signs of infection - fever or chills, cough, sore throat, pain or difficulty passing urine -signs of decreased platelets or bleeding - bruising, pinpoint red spots on the skin, black, tarry stools, nosebleeds -signs of decreased red blood cells - unusually weak or tired, fainting spells, lightheadedness -breathing problems -changes in hearing -changes in vision -chest pain -high blood pressure -low blood counts - This drug may decrease the number of white blood cells, red blood cells and platelets. You may be at increased risk for infections and bleeding. -nausea and vomiting -pain, swelling, redness or irritation at the injection site -pain, tingling, numbness in the hands or feet -problems with balance, talking, walking -trouble passing urine or change in the amount of urine Side effects that usually do not require medical attention (report to your doctor or health care professional if they continue or are bothersome): -hair loss -loss of appetite -metallic taste in the mouth or changes in taste This list may not describe all possible side effects. Call your doctor for medical advice  about side effects. You may report side effects to FDA at 1-800-FDA-1088. Where should I keep my medicine? This drug is given in a hospital or clinic and will not be stored at home. NOTE: This sheet is a summary. It may not cover all possible information. If you have questions about this medicine, talk to your doctor, pharmacist, or health care provider.  2015, Elsevier/Gold Standard. (2007-03-29 14:38:05)

## 2014-08-09 NOTE — Progress Notes (Signed)
Labs drawn

## 2014-08-09 NOTE — Progress Notes (Signed)
Okay per Dr. Whitney Muse to treat before today's lab results are back.  1540:  Tolerated tx w/o adverse reaction.  VSS.  A&Ox4; in no distress.  Discharged via wheelchair in c/o family for transport home.

## 2014-08-09 NOTE — Progress Notes (Signed)
What to Know After Chemo paper given and reviewed. Distress screening done. Chaplain notified and is to come today to see patient.

## 2014-08-10 ENCOUNTER — Encounter (HOSPITAL_COMMUNITY): Payer: Self-pay | Admitting: Emergency Medicine

## 2014-08-10 NOTE — Progress Notes (Signed)
24 hour follow up pt states that she has gotten along good today.  Had a few questions about the OBI neulasta, questions answered.

## 2014-08-14 ENCOUNTER — Encounter: Payer: Self-pay | Admitting: *Deleted

## 2014-08-14 NOTE — Progress Notes (Signed)
[place] Clinical Social Work  Clinical Social Work received return phone call from pt and CSW called pt back. CSW provided supportive listening. Pt shared she hasn't been out or anywhere since her treatment started. She stated she was feeling ok, just didn't want to catch anything. Pt appreciated call and denied needs currently.     Loren Racer, [place] Tuesdays   [phone]

## 2014-08-14 NOTE — Progress Notes (Signed)
[place] Psychosocial Distress Screening Clinical Social Work  Clinical Social Work was referred by distress screening protocol.  The patient scored a 6 on the Psychosocial Distress Thermometer which indicates moderate distress. Clinical Social Worker met with pt prior to chemo teaching and addressed many of these concerns. Pt had her sleep concerns addressed by medical team today. CSW phoned pt to assess for distress and other psychosocial needs. CSW left message and will reach out to her at future appointments. Pt is very aware of resources to assist her during her cancer journey.   ONCBCN DISTRESS SCREENING 08/09/2014  Screening Type Initial Screening  Distress experienced in past week (1-10) 6  Emotional problem type Nervousness/Anxiety;Adjusting to illness;Adjusting to appearance changes  Spiritual/Religous concerns type Facing my mortality;Relating to God  Physical Problem type Sleep/insomnia  Physician notified of physical symptoms Yes  Referral to support programs Yes    Clinical Social Worker follow up needed: No.  If yes, follow up plan: Loren Racer, [place]  [facility] Phone: [phone] Fax: [phone]

## 2014-08-16 ENCOUNTER — Encounter (HOSPITAL_COMMUNITY): Payer: Medicare Other

## 2014-08-16 ENCOUNTER — Encounter (HOSPITAL_COMMUNITY): Payer: Self-pay | Admitting: Oncology

## 2014-08-16 ENCOUNTER — Encounter (HOSPITAL_BASED_OUTPATIENT_CLINIC_OR_DEPARTMENT_OTHER): Payer: Medicare Other | Admitting: Oncology

## 2014-08-16 VITALS — BP 106/53 | HR 105 | Temp 98.2°F | Resp 16 | Wt 112.5 lb

## 2014-08-16 DIAGNOSIS — C541 Malignant neoplasm of endometrium: Secondary | ICD-10-CM | POA: Diagnosis not present

## 2014-08-16 DIAGNOSIS — R918 Other nonspecific abnormal finding of lung field: Secondary | ICD-10-CM

## 2014-08-16 LAB — CBC WITH DIFFERENTIAL/PLATELET
BLASTS: 0 %
Band Neutrophils: 62 % — ABNORMAL HIGH (ref 0–10)
Basophils Absolute: 0 10*3/uL (ref 0.0–0.1)
Basophils Relative: 0 % (ref 0–1)
EOS ABS: 0 10*3/uL (ref 0.0–0.7)
EOS PCT: 0 % (ref 0–5)
HCT: 35.3 % — ABNORMAL LOW (ref 36.0–46.0)
Hemoglobin: 11.7 g/dL — ABNORMAL LOW (ref 12.0–15.0)
Lymphocytes Relative: 25 % (ref 12–46)
Lymphs Abs: 4.1 10*3/uL — ABNORMAL HIGH (ref 0.7–4.0)
MCH: 32.8 pg (ref 26.0–34.0)
MCHC: 33.1 g/dL (ref 30.0–36.0)
MCV: 98.9 fL (ref 78.0–100.0)
METAMYELOCYTES PCT: 0 %
Monocytes Absolute: 1 10*3/uL (ref 0.1–1.0)
Monocytes Relative: 6 % (ref 3–12)
Myelocytes: 0 %
Neutro Abs: 11.2 10*3/uL — ABNORMAL HIGH (ref 1.7–7.7)
Neutrophils Relative %: 7 % — ABNORMAL LOW (ref 43–77)
PLATELETS: 191 10*3/uL (ref 150–400)
Promyelocytes Absolute: 0 %
RBC: 3.57 MIL/uL — ABNORMAL LOW (ref 3.87–5.11)
RDW: 14.7 % (ref 11.5–15.5)
WBC: 16.3 10*3/uL — ABNORMAL HIGH (ref 4.0–10.5)
nRBC: 0 /100 WBC

## 2014-08-16 LAB — BASIC METABOLIC PANEL
Anion gap: 7 (ref 5–15)
BUN: 10 mg/dL (ref 6–20)
CO2: 26 mmol/L (ref 22–32)
Calcium: 8.7 mg/dL — ABNORMAL LOW (ref 8.9–10.3)
Chloride: 99 mmol/L — ABNORMAL LOW (ref 101–111)
Creatinine, Ser: 0.59 mg/dL (ref 0.44–1.00)
Glucose, Bld: 150 mg/dL — ABNORMAL HIGH (ref 65–99)
Potassium: 3.8 mmol/L (ref 3.5–5.1)
SODIUM: 132 mmol/L — AB (ref 135–145)

## 2014-08-16 NOTE — Patient Instructions (Signed)
[place] at [facility] Discharge Instructions  RECOMMENDATIONS MADE BY THE CONSULTANT AND ANY TEST RESULTS WILL BE SENT TO YOUR REFERRING PHYSICIAN.  Exam completed by Kirby Crigler today OTC antihistimine for cough, like claritin/allegra/allavert/zyrtec Avoid benadryl at this time Return 2 weeks for follow up Please call the clinic if you have any questions or concerns  Thank you for choosing [place] at [facility] to provide your oncology and hematology care.  To afford each patient quality time with our provider, please arrive at least 15 minutes before your scheduled appointment time.    You need to re-schedule your appointment should you arrive 10 or more minutes late.  We strive to give you quality time with our providers, and arriving late affects you and other patients whose appointments are after yours.  Also, if you no show three or more times for appointments you may be dismissed from the clinic at the providers discretion.     Again, thank you for choosing [facility].  Our hope is that these requests will decrease the amount of time that you wait before being seen by our physicians.       _____________________________________________________________  Should you have questions after your visit to [facility], please contact our office at [phone] between the hours of 8:30 a.m. and 4:30 p.m.  Voicemails left after 4:30 p.m. will not be returned until the following business day.  For prescription refill requests, have your pharmacy contact our office.

## 2014-08-16 NOTE — Progress Notes (Signed)
Alejandra Cahill, MD  [place] [address]  Endometrial ca - Plan: CA 125  CURRENT THERAPY: Carboplatin/Paclitaxel  INTERVAL HISTORY: Alejandra Callahan 69 y.o. female returns for followup of Stage IV metastatic endometrial cancer, biopsy proven on pulmonary nodule biopsy on 07/24/2014 showing ER+ and PR+ disease with a history of endometrial carcinoma, endometrioid adenocarcinoma with squamous differentiation, Stage IA Grade 3. 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  Oncologically, she also has a history of breast cancer diagnosed and treated in 1982 with radical mastectomy with lymphadenectomy.    Endometrial ca   11/28/2012 Initial Diagnosis Endometrial ca, Embx Grade 2   12/09/2012 Surgery IAG3, close follow up   07/05/2014 Imaging Chest Xray- multiple new pulmonary nodules consistent with pulmonary metastases.   07/13/2014 PET scan Multiple hypermetabolic pulmonary nodules and masses of varying size in the lungs bilaterally. Imaging features are compatible with metastatic disease.   07/24/2014 Pathology Results Lung, needle/core biopsy(ies), right - ADENOCARCINOMA.   08/06/2014 Procedure Port placed by IR   08/09/2014 -  Chemotherapy Carboplatin/Paclitaxel   She tolerated her first cycle of chemotherapy well thus far.  She notes 1 episode of nausea that was aborted with an antiemetic at home.  She denies any vomiting.  She reports that she feels better today and her fatigue is quickly improving.   She otherwise reports a dry cough that is non-productive of sputum.  She denies any fevers or chills.  She denies a sore throat.  She is concerned that it is her cancer growing.  I suspect this is allergy related and I have therefore recommended OTC anti-histamine, newer generation.    Past Medical History  Diagnosis Date  . Cancer       breast  . Arthritis     Rheumatoid arthritis  . PONV (postoperative nausea and vomiting) 1982  . Breast cancer 09/1980    left unilateral mastectomy, no chemo or radiatioin  . Endometrial cancer 2014    has Endometrial ca and BRCA negative on her problem list.     is allergic to codeine; acyclovir and related; other; tuberculin ppd; and tylox.  Current Outpatient Prescriptions on File Prior to Visit  Medication Sig Dispense Refill  . acetaminophen (TYLENOL) 500 MG tablet Take 500 mg by mouth every 6 (six) hours as needed for moderate pain.     Marland Kitchen acyclovir (ZOVIRAX) 400 MG tablet Take 1 tablet (400 mg total) by mouth 2 (two) times daily. 60 tablet 2  . alendronate (FOSAMAX) 10 MG tablet Take 70 mg by mouth once a week. Monday    . Calcium-Vitamin D (CALTRATE 600 PLUS-VIT D PO) Take 1 each by mouth 2 (two) times daily.     Marland Kitchen CARBOPLATIN IV Inject into the vein every 21 ( twenty-one) days. To start 08/08/14    . cholecalciferol (VITAMIN D) 1000 UNITS tablet Take 1,000 Units by mouth daily.    Marland Kitchen dexamethasone (DECADRON) 4 MG tablet Day before chemo: take 5 tablets (26m) in the am and 5 tablets (251m in the pm. Morning of chemo take 5 tablets (2069m 45 tablet 1  . folic acid (FOLVITE) 800381G tablet Take 800 mcg by mouth daily.     . hydroxychloroquine (PLAQUENIL) 200 MG tablet Take by mouth daily.    . lMarland Kitchendocaine-prilocaine (EMLA) cream Apply a quarter  size amount to port site 1 hour prior to chemo. Do not rub in. Cover with plastic wrap. 30 g 3  . Melatonin 3 MG CAPS Take 3 mg by mouth at bedtime as needed.    . methotrexate (RHEUMATREX) 2.5 MG tablet Take 2.5 mg by mouth 2 (two) times a week. Caution:Chemotherapy. Protect from light.  Pt to take 4 tablets on Friday and 4 tablets on Saturday    . Multiple Vitamin (MULTIVITAMIN) tablet Take 1 tablet by mouth daily.    . ondansetron (ZOFRAN) 8 MG tablet Take 1 tablet (8 mg total) by mouth every 8 (eight) hours as needed for nausea or  vomiting. 30 tablet 2  . PACLitaxel (TAXOL) 300 MG/50ML injection Inject into the vein every 21 ( twenty-one) days. To start 08/08/14    . Pegfilgrastim (NEULASTA ONPRO [place]) Inject into the skin every 21 ( twenty-one) days. To be given 27 hours after chemo (starting 08/09/14)    . predniSONE (DELTASONE) 5 MG tablet Take 5 mg by mouth daily as needed (flare up).     . prochlorperazine (COMPAZINE) 10 MG tablet Take 1 tablet (10 mg total) by mouth every 6 (six) hours as needed (Nausea or vomiting). 30 tablet 2  . raloxifene (EVISTA) 60 MG tablet Take 60 mg by mouth daily.    Marland Kitchen zolpidem (AMBIEN) 10 MG tablet Take 1/2 tab to 1 tab at bedtime as needed for trouble sleeping. 30 tablet 0   No current facility-administered medications on file prior to visit.    Past Surgical History  Procedure Laterality Date  . Mastectomy Left 09/1980  . Tubal ligation  April 1982  . Dilatation & currettage/hysteroscopy with resectocope N/A 11/18/2012    Procedure: DILATATION & CURETTAGE/HYSTEROSCOPY WITH RESECTOCOPE;  Surgeon: Marvene Staff, MD;  Location: [facility];  Service: Gynecology;  Laterality: N/A;  . Breast biopsy Left 09/11/80  . Vaginal hysterectomy  12/09/2012    Complete hysterectomy [facility]    Denies any headaches, dizziness, double vision, fevers, chills, night sweats, nausea, vomiting, diarrhea, constipation, chest pain, heart palpitations, shortness of breath, blood in stool, black tarry stool, urinary pain, urinary burning, urinary frequency, hematuria.   PHYSICAL EXAMINATION  ECOG PERFORMANCE STATUS: 0 - Asymptomatic  Filed Vitals:   08/16/14 0900  BP: 106/53  Pulse: 105  Temp: 98.2 F (36.8 C)  Resp: 16    GENERAL:alert, no distress, well nourished, well developed, comfortable, cooperative and smiling SKIN: skin color, texture, turgor are normal, no rashes or significant lesions HEAD: Normocephalic, No masses, lesions, tenderness or abnormalities EYES: normal, PERRLA, EOMI,  Conjunctiva are pink and non-injected EARS: External ears normal OROPHARYNX:lips, buccal mucosa, and tongue normal and mucous membranes are moist  NECK: supple, no adenopathy, thyroid normal size, non-tender, without nodularity, no stridor, non-tender, trachea midline LYMPH:  no palpable lymphadenopathy, no hepatosplenomegaly BREAST:not examined LUNGS: clear to auscultation  HEART: regular rate & rhythm, no murmurs and no gallops ABDOMEN:abdomen soft, non-tender, normal bowel sounds and no masses or organomegaly BACK: Back symmetric, no curvature. EXTREMITIES:less then 2 second capillary refill, no joint deformities, effusion, or inflammation, no skin discoloration, no cyanosis  NEURO: alert & oriented x 3 with fluent speech, no focal motor/sensory deficits, gait normal   LABORATORY DATA: CBC    Component Value Date/Time   WBC 16.3* 08/16/2014 0859   RBC 3.57* 08/16/2014 0859   HGB 11.7* 08/16/2014 0859   HCT 35.3* 08/16/2014 0859   PLT 191 08/16/2014 0859   MCV 98.9 08/16/2014  0859   MCH 32.8 08/16/2014 0859   MCHC 33.1 08/16/2014 0859   RDW 14.7 08/16/2014 0859   LYMPHSABS 4.1* 08/16/2014 0859   MONOABS 1.0 08/16/2014 0859   EOSABS 0.0 08/16/2014 0859   BASOSABS 0.0 08/16/2014 0859      Chemistry      Component Value Date/Time   NA 132* 08/16/2014 0859   K 3.8 08/16/2014 0859   CL 99* 08/16/2014 0859   CO2 26 08/16/2014 0859   BUN 10 08/16/2014 0859   CREATININE 0.59 08/16/2014 0859      Component Value Date/Time   CALCIUM 8.7* 08/16/2014 0859   ALKPHOS 57 08/09/2014 0935   AST 27 08/09/2014 0935   ALT 17 08/09/2014 0935   BILITOT 0.3 08/09/2014 0935        PENDING LABS:   RADIOGRAPHIC STUDIES:  Dg Chest 1 View  07/24/2014   CLINICAL DATA:  Right lung biopsy.  EXAM: CHEST  1 VIEW  COMPARISON:  CT 07/24/2014.  FINDINGS: Mediastinum hilar structures normal. Bilateral multiple pulmonary mass lesions are noted. No pleural effusion. No pneumothorax post  biopsy. Surgical clips left axilla. Prior left mastectomy. No acute bony abnormality.  IMPRESSION: No evidence of pneumothorax post biopsy. Multiple pulmonary mass lesions noted.   Electronically Signed   By: Marcello Moores  Register   On: 07/24/2014 11:18   Ir Fluoro Guide Cv Line Left  08/06/2014   CLINICAL DATA:  Metastatic endometrial carcinoma, access for chemotherapy  EXAM: RIGHT INTERNAL JUGULAR SINGLE LUMEN POWER PORT CATHETER INSERTION  Date:  8/1/20168/01/2014 2:19 pm  Radiologist:  M. Daryll Brod, MD  Guidance:  Ultrasound and fluoroscopic  FLUOROSCOPY TIME:  30 seconds, 3 mGy  MEDICATIONS AND MEDICAL HISTORY: 2 g Ancefadministered within 1 hour of the procedure.2.5 mg Versed, 50 mcg fentanyl  ANESTHESIA/SEDATION: 30 minutes  CONTRAST:  None.  COMPLICATIONS: None immediate  PROCEDURE: Informed consent was obtained from the patient following explanation of the procedure, risks, benefits and alternatives. The patient understands, agrees and consents for the procedure. All questions were addressed. A time out was performed.  Maximal barrier sterile technique utilized including caps, mask, sterile gowns, sterile gloves, large sterile drape, hand hygiene, and 2% chlorhexidine scrub.  Under sterile conditions and local anesthesia, right internal jugular micropuncture venous access was performed. Access was performed with ultrasound. Images were obtained for documentation. A guide wire was inserted followed by a transitional dilator. This allowed insertion of a guide wire and catheter into the IVC. Measurements were obtained from the SVC / RA junction back to the right IJ venotomy site. In the right infraclavicular chest, a subcutaneous pocket was created over the second anterior rib. This was done under sterile conditions and local anesthesia. 1% lidocaine with epinephrine was utilized for this. A 2.5 cm incision was made in the skin. Blunt dissection was performed to create a subcutaneous pocket over the right  pectoralis major muscle. The pocket was flushed with saline vigorously. There was adequate hemostasis. The port catheter was assembled and checked for leakage. The port catheter was secured in the pocket with two retention sutures. The tubing was tunneled subcutaneously to the right venotomy site and inserted into the SVC/RA junction through a valved peel-away sheath. Position was confirmed with fluoroscopy. Images were obtained for documentation. The patient tolerated the procedure well. No immediate complications. Incisions were closed in a two layer fashion with 4 - 0 Vicryl suture. Dermabond was applied to the skin. The port catheter was accessed, blood was aspirated followed by  saline and heparin flushes. Needle was removed. A dry sterile dressing was applied.  IMPRESSION: Ultrasound and fluoroscopically guided right internal jugular single lumen power port catheter insertion. Tip in the SVC/RA junction. Catheter ready for use.   Electronically Signed   By: Jerilynn Mages.  Shick M.D.   On: 08/06/2014 14:30   Ir US Guide Vasc Access Right  08/06/2014   CLINICAL DATA:  Metastatic endometrial carcinoma, access for chemotherapy  EXAM: RIGHT INTERNAL JUGULAR SINGLE LUMEN POWER PORT CATHETER INSERTION  Date:  8/1/20168/01/2014 2:19 pm  Radiologist:  M. Daryll Brod, MD  Guidance:  Ultrasound and fluoroscopic  FLUOROSCOPY TIME:  30 seconds, 3 mGy  MEDICATIONS AND MEDICAL HISTORY: 2 g Ancefadministered within 1 hour of the procedure.2.5 mg Versed, 50 mcg fentanyl  ANESTHESIA/SEDATION: 30 minutes  CONTRAST:  None.  COMPLICATIONS: None immediate  PROCEDURE: Informed consent was obtained from the patient following explanation of the procedure, risks, benefits and alternatives. The patient understands, agrees and consents for the procedure. All questions were addressed. A time out was performed.  Maximal barrier sterile technique utilized including caps, mask, sterile gowns, sterile gloves, large sterile drape, hand hygiene, and 2%  chlorhexidine scrub.  Under sterile conditions and local anesthesia, right internal jugular micropuncture venous access was performed. Access was performed with ultrasound. Images were obtained for documentation. A guide wire was inserted followed by a transitional dilator. This allowed insertion of a guide wire and catheter into the IVC. Measurements were obtained from the SVC / RA junction back to the right IJ venotomy site. In the right infraclavicular chest, a subcutaneous pocket was created over the second anterior rib. This was done under sterile conditions and local anesthesia. 1% lidocaine with epinephrine was utilized for this. A 2.5 cm incision was made in the skin. Blunt dissection was performed to create a subcutaneous pocket over the right pectoralis major muscle. The pocket was flushed with saline vigorously. There was adequate hemostasis. The port catheter was assembled and checked for leakage. The port catheter was secured in the pocket with two retention sutures. The tubing was tunneled subcutaneously to the right venotomy site and inserted into the SVC/RA junction through a valved peel-away sheath. Position was confirmed with fluoroscopy. Images were obtained for documentation. The patient tolerated the procedure well. No immediate complications. Incisions were closed in a two layer fashion with 4 - 0 Vicryl suture. Dermabond was applied to the skin. The port catheter was accessed, blood was aspirated followed by saline and heparin flushes. Needle was removed. A dry sterile dressing was applied.  IMPRESSION: Ultrasound and fluoroscopically guided right internal jugular single lumen power port catheter insertion. Tip in the SVC/RA junction. Catheter ready for use.   Electronically Signed   By: Jerilynn Mages.  Shick M.D.   On: 08/06/2014 14:30   Ct Biopsy  07/24/2014   CLINICAL DATA:  Lung lesions  EXAM: CT-GUIDED BIOPSY OF A RIGHT LOWER LOBE LUNG NODULE.  CORE.  MEDICATIONS AND MEDICAL HISTORY: Versed 1 mg,  Fentanyl 50 mcg.  Additional Medications: None.  ANESTHESIA/SEDATION: Moderate sedation time: 10 minutes  PROCEDURE: The procedure, risks, benefits, and alternatives were explained to the patient. Questions regarding the procedure were encouraged and answered. The patient understands and consents to the procedure.  The right posterior thorax was prepped with Betadine in a sterile fashion, and a sterile drape was applied covering the operative field. A sterile gown and sterile gloves were used for the procedure.  Under CT guidance, a(n) 17 gauge guide needle was advanced into  the right lower lobe lung mass. Subsequently 4 18 gauge core biopsies were obtained. The guide needle was removed. Final imaging was performed.  Patient tolerated the procedure well without complication. Vital sign monitoring by nursing staff during the procedure will continue as patient is in the special procedures unit for post procedure observation.  FINDINGS: The images document guide needle placement within the right lower lobe lung mass. Post biopsy images demonstrate no pneumothorax.  COMPLICATIONS: None  IMPRESSION: Successful CT-guided core biopsy of a right lower lobe lung mass.   Electronically Signed   By: Marybelle Killings M.D.   On: 07/24/2014 12:26     PATHOLOGY:    ASSESSMENT AND PLAN:  Endometrial ca Stage IV metastatic endometrial cancer, biopsy proven on pulmonary nodule biopsy on 07/24/2014 showing ER+ and PR+ disease with a history of endometrial carcinoma, endometrioid adenocarcinoma with squamous differentiation, Stage IA Grade 3. 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    Staging completed.  Oncology history updated.  She started systemic chemotherapy with Carboplatin/Paclitaxel on 08/09/2014.  I have added CA 125 marker to day 1 of each cycle pre-chemo labs per Dr.  Serita Grit recommendations, although CA 125 is not a good marker to follow on this patient given her normal value pre-treatment (admittedly noted by Dr. Denman George in her consult note).  I have recommended a newer generation anti-histamine for dry cough, such as Allegra, Allavert, Claritin, Zyrtec.  She is to call with any signs or symptoms of infection.  Return in 2 weeks for follow-up and to embark on cycle 2 of chemotherapy with pre-chemo labs prior to therapy.  We will get her set-up for repeat imaging following cycle 3 of therapy, sooner if indicated.    THERAPY PLAN:  Will continue treatment as planned making dose adjustments if/when indicated.  She will be restaged with CT imaging following 3 cycles.  All questions were answered. The patient knows to call the clinic with any problems, questions or concerns. We can certainly see the patient much sooner if necessary.  Patient and plan discussed with Dr. Ancil Linsey and she is in agreement with the aforementioned.   This note is electronically signed by: Doy Mince 08/16/2014 10:01 AM

## 2014-08-16 NOTE — Assessment & Plan Note (Addendum)
Stage IV metastatic endometrial cancer, biopsy proven on pulmonary nodule biopsy on 07/24/2014 showing ER+ and PR+ disease with a history of endometrial carcinoma, endometrioid adenocarcinoma with squamous differentiation, Stage IA Grade 3. 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    Staging completed.  Oncology history updated.  She started systemic chemotherapy with Carboplatin/Paclitaxel on 08/09/2014.  I have added CA 125 marker to day 1 of each cycle pre-chemo labs per Dr. Serita Grit recommendations, although CA 125 is not a good marker to follow on this patient given her normal value pre-treatment (admittedly noted by Dr. Denman George in her consult note).  I have recommended a newer generation anti-histamine for dry cough, such as Allegra, Allavert, Claritin, Zyrtec.  She is to call with any signs or symptoms of infection.  Return in 2 weeks for follow-up and to embark on cycle 2 of chemotherapy with pre-chemo labs prior to therapy.  We will get her set-up for repeat imaging following cycle 3 of therapy, sooner if indicated.

## 2014-08-29 ENCOUNTER — Encounter (HOSPITAL_BASED_OUTPATIENT_CLINIC_OR_DEPARTMENT_OTHER): Payer: Medicare Other

## 2014-08-29 DIAGNOSIS — R918 Other nonspecific abnormal finding of lung field: Secondary | ICD-10-CM

## 2014-08-29 DIAGNOSIS — C7801 Secondary malignant neoplasm of right lung: Secondary | ICD-10-CM | POA: Diagnosis not present

## 2014-08-29 DIAGNOSIS — C541 Malignant neoplasm of endometrium: Secondary | ICD-10-CM | POA: Diagnosis not present

## 2014-08-29 LAB — CBC WITH DIFFERENTIAL/PLATELET
BASOS PCT: 1 % (ref 0–1)
Basophils Absolute: 0.1 10*3/uL (ref 0.0–0.1)
EOS PCT: 2 % (ref 0–5)
Eosinophils Absolute: 0.1 10*3/uL (ref 0.0–0.7)
HCT: 35.1 % — ABNORMAL LOW (ref 36.0–46.0)
HEMOGLOBIN: 11.8 g/dL — AB (ref 12.0–15.0)
LYMPHS ABS: 1.2 10*3/uL (ref 0.7–4.0)
Lymphocytes Relative: 19 % (ref 12–46)
MCH: 33.7 pg (ref 26.0–34.0)
MCHC: 33.6 g/dL (ref 30.0–36.0)
MCV: 100.3 fL — ABNORMAL HIGH (ref 78.0–100.0)
MONO ABS: 0.7 10*3/uL (ref 0.1–1.0)
Monocytes Relative: 11 % (ref 3–12)
NEUTROS PCT: 67 % (ref 43–77)
Neutro Abs: 4.2 10*3/uL (ref 1.7–7.7)
PLATELETS: 289 10*3/uL (ref 150–400)
RBC: 3.5 MIL/uL — ABNORMAL LOW (ref 3.87–5.11)
RDW: 15.8 % — AB (ref 11.5–15.5)
WBC: 6.3 10*3/uL (ref 4.0–10.5)

## 2014-08-29 LAB — COMPREHENSIVE METABOLIC PANEL
ALBUMIN: 3.3 g/dL — AB (ref 3.5–5.0)
ALK PHOS: 62 U/L (ref 38–126)
ALT: 19 U/L (ref 14–54)
ANION GAP: 5 (ref 5–15)
AST: 21 U/L (ref 15–41)
BUN: 15 mg/dL (ref 6–20)
CO2: 29 mmol/L (ref 22–32)
Calcium: 8.6 mg/dL — ABNORMAL LOW (ref 8.9–10.3)
Chloride: 103 mmol/L (ref 101–111)
Creatinine, Ser: 0.52 mg/dL (ref 0.44–1.00)
GFR calc non Af Amer: >60 mL/min (ref >60)
GLUCOSE: 94 mg/dL (ref 65–99)
POTASSIUM: 4.2 mmol/L (ref 3.5–5.1)
SODIUM: 137 mmol/L (ref 135–145)
Total Bilirubin: 0.4 mg/dL (ref 0.3–1.2)
Total Protein: 6 g/dL — ABNORMAL LOW (ref 6.5–8.1)

## 2014-08-29 NOTE — Progress Notes (Signed)
Labs drawn

## 2014-08-30 ENCOUNTER — Encounter (HOSPITAL_BASED_OUTPATIENT_CLINIC_OR_DEPARTMENT_OTHER): Payer: Medicare Other

## 2014-08-30 ENCOUNTER — Encounter (HOSPITAL_BASED_OUTPATIENT_CLINIC_OR_DEPARTMENT_OTHER): Payer: Medicare Other | Admitting: Hematology & Oncology

## 2014-08-30 ENCOUNTER — Encounter (HOSPITAL_COMMUNITY): Payer: Self-pay | Admitting: Hematology & Oncology

## 2014-08-30 VITALS — BP 106/59 | HR 96 | Temp 98.0°F | Resp 16 | Wt 114.7 lb

## 2014-08-30 VITALS — BP 114/60 | HR 91 | Temp 98.4°F | Resp 20

## 2014-08-30 DIAGNOSIS — Z5111 Encounter for antineoplastic chemotherapy: Secondary | ICD-10-CM | POA: Diagnosis not present

## 2014-08-30 DIAGNOSIS — C541 Malignant neoplasm of endometrium: Secondary | ICD-10-CM | POA: Diagnosis not present

## 2014-08-30 DIAGNOSIS — C7800 Secondary malignant neoplasm of unspecified lung: Secondary | ICD-10-CM

## 2014-08-30 DIAGNOSIS — Z1371 Encounter for nonprocreative screening for genetic disease carrier status: Secondary | ICD-10-CM

## 2014-08-30 DIAGNOSIS — C7801 Secondary malignant neoplasm of right lung: Secondary | ICD-10-CM | POA: Diagnosis not present

## 2014-08-30 MED ORDER — FAMOTIDINE IN NACL 20-0.9 MG/50ML-% IV SOLN
20.0000 mg | Freq: Once | INTRAVENOUS | Status: AC
  Administered 2014-08-30: 20 mg via INTRAVENOUS
  Filled 2014-08-30: qty 50

## 2014-08-30 MED ORDER — DIPHENHYDRAMINE HCL 50 MG/ML IJ SOLN
25.0000 mg | Freq: Once | INTRAMUSCULAR | Status: AC
  Administered 2014-08-30: 25 mg via INTRAVENOUS
  Filled 2014-08-30: qty 1

## 2014-08-30 MED ORDER — SODIUM CHLORIDE 0.9 % IV SOLN
Freq: Once | INTRAVENOUS | Status: AC
  Administered 2014-08-30: 09:00:00 via INTRAVENOUS
  Filled 2014-08-30: qty 8

## 2014-08-30 MED ORDER — SODIUM CHLORIDE 0.9 % IJ SOLN
10.0000 mL | INTRAMUSCULAR | Status: DC | PRN
Start: 2014-08-30 — End: 2014-08-30
  Administered 2014-08-30: 10 mL
  Filled 2014-08-30: qty 10

## 2014-08-30 MED ORDER — HEPARIN SOD (PORK) LOCK FLUSH 100 UNIT/ML IV SOLN
500.0000 [IU] | Freq: Once | INTRAVENOUS | Status: AC | PRN
  Administered 2014-08-30: 500 [IU]
  Filled 2014-08-30: qty 5

## 2014-08-30 MED ORDER — PACLITAXEL CHEMO INJECTION 300 MG/50ML
175.0000 mg/m2 | Freq: Once | INTRAVENOUS | Status: AC
  Administered 2014-08-30: 258 mg via INTRAVENOUS
  Filled 2014-08-30: qty 43

## 2014-08-30 MED ORDER — SODIUM CHLORIDE 0.9 % IV SOLN
347.5000 mg | Freq: Once | INTRAVENOUS | Status: AC
  Administered 2014-08-30: 350 mg via INTRAVENOUS
  Filled 2014-08-30: qty 35

## 2014-08-30 MED ORDER — SODIUM CHLORIDE 0.9 % IV SOLN
Freq: Once | INTRAVENOUS | Status: AC
  Administered 2014-08-30: 09:00:00 via INTRAVENOUS

## 2014-08-30 MED ORDER — MAGIC MOUTHWASH W/LIDOCAINE: 5.0000 mL | Freq: Three times a day (TID) | ORAL | Status: DC | PRN

## 2014-08-30 MED ORDER — PEGFILGRASTIM 6 MG/0.6ML ~~LOC~~ PSKT
6.0000 mg | PREFILLED_SYRINGE | Freq: Once | SUBCUTANEOUS | Status: AC
  Administered 2014-08-30: 6 mg via SUBCUTANEOUS
  Filled 2014-08-30: qty 0.6

## 2014-08-30 NOTE — Progress Notes (Signed)
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 Final immunophenotype c/w endometrial   HISTORY OF PRESENTING ILLNESS:  Alejandra Callahan 69 y.o. female is here because of abnormal CXR with a history of breast and endometrial cancer.She presented to her primary care doctor with a new onset cough and upper respiratory infection. A chest x-ray revealed multiple pulmonary nodules. PET/CT showed hypermetabolism and given her cancer history she was referred for a biopsy. Biopsy unfortunately showed recurrence of her endometrial cancer. She completed her first cycle of carboplatin/taxol on 08/09/2014 and is here today for cycle #2.   She has been eating 3 meals today and her nausea has improved.  She has not been napping as much and been doing house chores.  She has felt normal within the last couple of days including her mood and anxiety.  She had some muscle and bone aching in her left lower back and her right toes however this was alleviated with Aleve.  She denies neuropathy.  She experienced mild mouth and teeth soreness and was coughing last week however both have now subsided.  She has experienced constipation while on her nausea medication but she has self-managed it.  She is being positive and is happy that she handled her first treatment as well as she did.  She presents today for cycle 2.     Endometrial ca   11/28/2012 Initial Diagnosis Endometrial ca, Embx  Grade 2   12/09/2012 Surgery IAG3, close follow up   07/05/2014 Imaging Chest Xray- multiple new pulmonary nodules consistent with pulmonary metastases.   07/13/2014 PET scan Multiple hypermetabolic pulmonary nodules and masses of varying size in the lungs bilaterally. Imaging features are compatible with metastatic disease.   07/24/2014 Pathology Results Lung, needle/core biopsy(ies), right - ADENOCARCINOMA.   08/06/2014 Procedure Port placed by IR   08/09/2014 -  Chemotherapy Carboplatin/Paclitaxel     MEDICAL HISTORY:  Past Medical History  Diagnosis Date  . Cancer     breast  . Arthritis     Rheumatoid arthritis  . PONV (postoperative nausea and vomiting) 1982  . Breast cancer 09/1980    left unilateral mastectomy, no chemo or radiatioin  . Endometrial cancer 2014    SURGICAL HISTORY: Past Surgical History  Procedure Laterality Date  . Mastectomy Left 09/1980  . Tubal ligation  April 1982  . Dilatation & currettage/hysteroscopy with resectocope N/A 11/18/2012    Procedure: DILATATION & CURETTAGE/HYSTEROSCOPY WITH RESECTOCOPE;  Surgeon: Marvene Staff, MD;  Location: [facility];  Service: Gynecology;  Laterality: N/A;  . Breast biopsy Left 09/11/80  . Vaginal hysterectomy  12/09/2012    Complete hysterectomy [facility]    SOCIAL HISTORY: Social History   Social History  . Marital Status: Married    Spouse Name: N/A  . Number of Children: 2  . Years of Education: N/A   Occupational History  . Not on file.   Social History Main Topics  . Smoking status: Never Smoker   . Smokeless tobacco: Never Used  . Alcohol Use: No  .  Drug Use: No  . Sexual Activity: Not on file   Other Topics Concern  . Not on file   Social History Narrative  Married, 55 years 2 grandchildren, 2 children Ex-teacher and Microbiologist Non-smoker ETOH, none  FAMILY HISTORY: Family History  Problem Relation Age of Onset  . Breast cancer Mother 24  . Heart attack Mother 87  . Colon  cancer Father 21  . Heart attack Father 71  . Breast cancer Sister 72  . Heart attack Paternal Grandfather 80  . Breast cancer Other     mother's paternal first cousin dx in late 40s-early 66s   indicated that her mother is deceased. She indicated that her father is deceased. She indicated that both of her sisters are alive. She indicated that her maternal grandmother is deceased. She indicated that her maternal grandfather is deceased. She indicated that her paternal grandmother is deceased. She indicated that her paternal grandfather is deceased. She indicated that her other is deceased.  Mother deceased at 25, heart attack Father deceased at 65, congested heart failure 2 sisters, one with breast cancer  ALLERGIES:  is allergic to codeine; acyclovir and related; other; tuberculin ppd; and tylox.  MEDICATIONS:  Current Outpatient Prescriptions  Medication Sig Dispense Refill  . alendronate (FOSAMAX) 10 MG tablet Take 70 mg by mouth once a week. Monday    . Calcium-Vitamin D (CALTRATE 600 PLUS-VIT D PO) Take 1 each by mouth 2 (two) times daily.     Marland Kitchen CARBOPLATIN IV Inject into the vein every 21 ( twenty-one) days. To start 08/08/14    . cholecalciferol (VITAMIN D) 1000 UNITS tablet Take 1,000 Units by mouth daily.    Marland Kitchen dexamethasone (DECADRON) 4 MG tablet Day before chemo: take 5 tablets (75m) in the am and 5 tablets (248m in the pm. Morning of chemo take 5 tablets (2051m 45 tablet 1  . folic acid (FOLVITE) 800762G tablet Take 800 mcg by mouth daily.     . hydroxychloroquine (PLAQUENIL) 200 MG tablet Take by mouth daily.    . lMarland Kitchendocaine-prilocaine (EMLA) cream Apply a quarter size amount to port site 1 hour prior to chemo. Do not rub in. Cover with plastic wrap. 30 g 3  . Melatonin 3 MG CAPS Take 3 mg by mouth at bedtime as needed.    . methotrexate (RHEUMATREX) 2.5 MG tablet Take 2.5 mg by mouth 2 (two) times a week. Caution:Chemotherapy. Protect from light.  Pt to take 4 tablets on  Friday and 4 tablets on Saturday    . Multiple Vitamin (MULTIVITAMIN) tablet Take 1 tablet by mouth daily.    . ondansetron (ZOFRAN) 8 MG tablet Take 1 tablet (8 mg total) by mouth every 8 (eight) hours as needed for nausea or vomiting. 30 tablet 2  . PACLitaxel (TAXOL) 300 MG/50ML injection Inject into the vein every 21 ( twenty-one) days. To start 08/08/14    . Pegfilgrastim (NEULASTA ONPRO [place]) Inject into the skin every 21 ( twenty-one) days. To be given 27 hours after chemo (starting 08/09/14)    . raloxifene (EVISTA) 60 MG tablet Take 60 mg by mouth daily.    . zMarland Kitchenlpidem (AMBIEN) 10 MG tablet Take 1/2 tab to 1 tab at bedtime as needed for trouble sleeping. 30 tablet 0  . acetaminophen (TYLENOL) 500 MG tablet Take 500 mg by mouth every 6 (six) hours as needed for moderate pain.     . AMarland Kitchenum & Mag Hydroxide-Simeth (MAGIC MOUTHWASH W/LIDOCAINE) SOLN Take 5 mLs  by mouth 3 (three) times daily as needed for mouth pain (use as needed for mouth pain). (Patient not taking: Reported on 09/20/2014) 200 mL 3  . predniSONE (DELTASONE) 5 MG tablet Take 5 mg by mouth daily as needed (flare up).     . prochlorperazine (COMPAZINE) 10 MG tablet Take 1 tablet (10 mg total) by mouth every 6 (six) hours as needed (Nausea or vomiting). (Patient not taking: Reported on 08/30/2014) 30 tablet 2   No current facility-administered medications for this visit.    Review of Systems  Respiratory: Positive for cough.   Psychiatric: positive for anxiety All other systems reviewed and are negative.  14 point ROS was done and is otherwise as detailed above or in HPI   PHYSICAL EXAMINATION: ECOG PERFORMANCE STATUS: 0 - Asymptomatic  Filed Vitals:   08/30/14 0847  BP: 106/59  Pulse: 96  Temp: 98 F (36.7 C)  Resp: 16   Filed Weights   08/30/14 0847  Weight: 114 lb 11.2 oz (52.028 kg)     Physical Exam  Constitutional: She is oriented to person, place, and time and well-developed, well-nourished, and in no distress.  Hair loss. HENT:  Head: Normocephalic and atraumatic.  Nose: Nose normal.  Mouth/Throat: Oropharynx is clear and moist. No oropharyngeal exudate.  Eyes: Conjunctivae and EOM are normal. Pupils are equal, round, and reactive to light. Right eye exhibits no discharge. Left eye exhibits no discharge. No scleral icterus.  Neck: Normal range of motion. Neck supple. No tracheal deviation present. No thyromegaly present.  Cardiovascular: Normal rate, regular rhythm and normal heart sounds.  Exam reveals no gallop and no friction rub.   No murmur heard. Pulmonary/Chest: Effort normal and breath sounds normal. She has no wheezes. She has no rales.  Abdominal: Soft. Bowel sounds are normal. She exhibits no distension and no mass. There is no tenderness. There is no rebound and no guarding.  Musculoskeletal: Normal range of motion. She exhibits no edema.  Lymphadenopathy:    She has no cervical adenopathy.  Neurological: She is alert and oriented to person, place, and time. She has normal reflexes. No cranial nerve deficit. Gait normal. Coordination normal.  Skin: Skin is warm and dry. No rash noted.  Psychiatric: Mood, memory, affect and judgment normal.  Nursing note and vitals reviewed.   LABORATORY DATA:  I have reviewed the data as listed  Results for ALIN, HUTCHINS (MRN [id number])   Ref. Range 08/29/2014 08:38  Sodium Latest Ref Range: 135-145 mmol/L 137  Potassium Latest Ref Range: 3.5-5.1 mmol/L 4.2  Chloride Latest Ref Range: 101-111 mmol/L 103  CO2 Latest Ref Range: 22-32 mmol/L 29  BUN Latest Ref Range: 6-20 mg/dL 15  Creatinine Latest Ref Range: 0.44-1.00 mg/dL 0.52  Calcium Latest Ref Range: 8.9-10.3 mg/dL 8.6 (L)  EGFR (Non-African Amer.) Latest Ref Range: >60 mL/min >60  EGFR (African American) Latest Ref Range: >60 mL/min >60  Glucose Latest Ref Range: 65-99 mg/dL 94  Anion gap Latest Ref Range: 5-15  5  Alkaline Phosphatase Latest Ref Range: 38-126 U/L 62  Albumin Latest Ref  Range: 3.5-5.0 g/dL 3.3 (L)  AST Latest Ref Range: 15-41 U/L 21  ALT Latest Ref Range: 14-54 U/L 19  Total Protein Latest Ref Range: 6.5-8.1 g/dL 6.0 (L)  Total Bilirubin Latest Ref Range: 0.3-1.2 mg/dL 0.4  WBC Latest Ref Range: 4.0-10.5 K/uL 6.3  RBC Latest Ref Range: 3.87-5.11 MIL/uL 3.50 (L)  Hemoglobin Latest Ref Range: 12.0-15.0 g/dL 11.8 (L)  HCT Latest Ref  Range: 36.0-46.0 % 35.1 (L)  MCV Latest Ref Range: 78.0-100.0 fL 100.3 (H)  MCH Latest Ref Range: 26.0-34.0 pg 33.7  MCHC Latest Ref Range: 30.0-36.0 g/dL 33.6  RDW Latest Ref Range: 11.5-15.5 % 15.8 (H)  Platelets Latest Ref Range: 150-400 K/uL 289  Neutrophils Latest Ref Range: 43-77 % 67  Lymphocytes Latest Ref Range: 12-46 % 19  Monocytes Relative Latest Ref Range: 3-12 % 11  Eosinophil Latest Ref Range: 0-5 % 2  Basophil Latest Ref Range: 0-1 % 1  NEUT# Latest Ref Range: 1.7-7.7 K/uL 4.2  Lymphocyte # Latest Ref Range: 0.7-4.0 K/uL 1.2  Monocyte # Latest Ref Range: 0.1-1.0 K/uL 0.7  Eosinophils Absolute Latest Ref Range: 0.0-0.7 K/uL 0.1  Basophils Absolute Latest Ref Range: 0.0-0.1 K/uL 0.1    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 Her anxiety is remarkably better. She has an outstanding support system. The plan is to proceed with cycle #2 of carboplatin and Taxol today. I will plan on seeing her back prior to cycle #3. She is aware of side effects and symptoms of concern. She knows to call in the interim with  any significant issues.  The plan is for 3 cycles followed by imaging/restaging. Should she obtain a response as I anticipate she will the goal is to complete a total of 6 cycles.  She was given a prescription for magic mouthwash to use if needed.  All questions were answered. The patient knows to call the clinic with any problems, questions or concerns. //  This document serves as a record of services personally performed by Ancil Linsey, MD. It was created on her behalf by Janace Hoard, a trained medical scribe. The creation of this record is based on the scribe's personal observations and the provider's statements to them. This document has been checked and approved by the attending provider.  I have reviewed the above documentation for accuracy and completeness, and I agree with the above.  This note was electronically signed.    Kelby Fam. Whitney Muse, MD

## 2014-08-30 NOTE — Patient Instructions (Addendum)
[facility] Discharge Instructions for Patients Receiving Chemotherapy  Today you received the following chemotherapy agents taxol and carbo Magic mouthwash given when needed for mouth sores Chemotherapy as planned today Return in 3 weeks to see the doctor and have chemotherapy Please call the clinic if you have any questions or concerns  To help prevent nausea and vomiting after your treatment, we encourage you to take your nausea medication    If you develop nausea and vomiting, or diarrhea that is not controlled by your medication, call the clinic.  The clinic phone number is [phone]. Office hours are Monday-Friday 8:30am-5:00pm.  BELOW ARE SYMPTOMS THAT SHOULD BE REPORTED IMMEDIATELY:  *FEVER GREATER THAN 101.0 F  *CHILLS WITH OR WITHOUT FEVER  NAUSEA AND VOMITING THAT IS NOT CONTROLLED WITH YOUR NAUSEA MEDICATION  *UNUSUAL SHORTNESS OF BREATH  *UNUSUAL BRUISING OR BLEEDING  TENDERNESS IN MOUTH AND THROAT WITH OR WITHOUT PRESENCE OF ULCERS  *URINARY PROBLEMS  *BOWEL PROBLEMS  UNUSUAL RASH Items with * indicate a potential emergency and should be followed up as soon as possible. If you have an emergency after office hours please contact your primary care physician or go to the nearest emergency department.  Please call the clinic during office hours if you have any questions or concerns.   You may also contact the Patient Navigator at [phone] should you have any questions or need assistance in obtaining follow up care. _____________________________________________________________________ Have you asked about our STAR program?    STAR stands for Survivorship Training and Rehabilitation, and this is a nationally recognized cancer care program that focuses on survivorship and rehabilitation.  Cancer and cancer treatments may cause problems, such as, pain, making you feel tired and keeping you from doing the things that you need or want to do.  Cancer rehabilitation can help. Our goal is to reduce these troubling effects and help you have the best quality of life possible.  You may receive a survey from a nurse that asks questions about your current state of health.  Based on the survey results, all eligible patients will be referred to the [facility] program for an evaluation so we can better serve you! A frequently asked questions sheet is available upon request.

## 2014-08-30 NOTE — Patient Instructions (Signed)
..  [place] [facility] Discharge Instructions  RECOMMENDATIONS MADE BY THE CONSULTANT AND ANY TEST RESULTS WILL BE SENT TO YOUR REFERRING PHYSICIAN.  Exam per Dr. Whitney Muse  Script for magic mouthwash  Return in 3 weeks   Thank you for choosing [facility] to provide your oncology and hematology care.  To afford each patient quality time with our provider, please arrive at least 15 minutes before your scheduled appointment time.    You need to re-schedule your appointment should you arrive 10 or more minutes late.  We strive to give you quality time with our providers, and arriving late affects you and other patients whose appointments are after yours.  Also, if you no show three or more times for appointments you may be dismissed from the clinic at the providers discretion.     Again, thank you for choosing [facility].  Our hope is that these requests will decrease the amount of time that you wait before being seen by our physicians.       _____________________________________________________________  Should you have questions after your visit to [facility], please contact our office at [phone] between the hours of 8:30 a.m. and 4:30 p.m.  Voicemails left after 4:30 p.m. will not be returned until the following business day.  For prescription refill requests, have your pharmacy contact our office.

## 2014-08-30 NOTE — Progress Notes (Signed)
Alejandra Callahan Tolerated chemotherapy well Discharged via wheelchair

## 2014-08-31 LAB — CA 125: CA 125: 58 U/mL — ABNORMAL HIGH (ref 0.0–38.1)

## 2014-09-03 ENCOUNTER — Telehealth (HOSPITAL_COMMUNITY): Payer: Self-pay

## 2014-09-03 NOTE — Telephone Encounter (Signed)
Call from patient with complaints of constipation.  Has been taking miralax and has not had good results.  Encouraged to use Senokot-S and can take 1 twice daily if needed.  States "I have some dulcolax pink so I'm going to try that first." Encouraged to use that and can add Milk of Magnesia as needed.  Verbalized understanding of instructions.

## 2014-09-11 ENCOUNTER — Telehealth (HOSPITAL_COMMUNITY): Payer: Self-pay

## 2014-09-11 NOTE — Telephone Encounter (Signed)
Call from [place] stating "On Sunday I just didn't feel well.  My temperature was about 1.5 degrees higher than it was on my last visit.  It was 99.5.  I took some aleve and went to bed.  On Monday and today I feel fine and my temperature is normal.  Also wanted to know when my next PET scan will be scheduled and where it will be."  Denies any symptoms or other concerns.  Instructed that Dr. Whitney Muse will discuss when to do the next PET on her next office visit.

## 2014-09-12 ENCOUNTER — Inpatient Hospital Stay (HOSPITAL_COMMUNITY): Payer: Medicare Other

## 2014-09-19 ENCOUNTER — Encounter (HOSPITAL_COMMUNITY): Payer: Medicare Other | Attending: Hematology & Oncology

## 2014-09-19 DIAGNOSIS — Z79899 Other long term (current) drug therapy: Secondary | ICD-10-CM | POA: Diagnosis not present

## 2014-09-19 DIAGNOSIS — Z853 Personal history of malignant neoplasm of breast: Secondary | ICD-10-CM | POA: Insufficient documentation

## 2014-09-19 DIAGNOSIS — C7801 Secondary malignant neoplasm of right lung: Secondary | ICD-10-CM | POA: Diagnosis not present

## 2014-09-19 DIAGNOSIS — C541 Malignant neoplasm of endometrium: Secondary | ICD-10-CM

## 2014-09-19 DIAGNOSIS — R918 Other nonspecific abnormal finding of lung field: Secondary | ICD-10-CM | POA: Diagnosis present

## 2014-09-19 DIAGNOSIS — M069 Rheumatoid arthritis, unspecified: Secondary | ICD-10-CM | POA: Diagnosis not present

## 2014-09-19 LAB — CBC WITH DIFFERENTIAL/PLATELET
Basophils Absolute: 0 10*3/uL (ref 0.0–0.1)
Basophils Relative: 0 %
EOS PCT: 0 %
Eosinophils Absolute: 0 10*3/uL (ref 0.0–0.7)
HEMATOCRIT: 33.3 % — AB (ref 36.0–46.0)
HEMOGLOBIN: 11.4 g/dL — AB (ref 12.0–15.0)
LYMPHS ABS: 0.7 10*3/uL (ref 0.7–4.0)
LYMPHS PCT: 6 %
MCH: 34.1 pg — AB (ref 26.0–34.0)
MCHC: 34.2 g/dL (ref 30.0–36.0)
MCV: 99.7 fL (ref 78.0–100.0)
Monocytes Absolute: 0.2 10*3/uL (ref 0.1–1.0)
Monocytes Relative: 2 %
NEUTROS ABS: 9.4 10*3/uL — AB (ref 1.7–7.7)
NEUTROS PCT: 92 %
Platelets: 242 10*3/uL (ref 150–400)
RBC: 3.34 MIL/uL — AB (ref 3.87–5.11)
RDW: 16.7 % — ABNORMAL HIGH (ref 11.5–15.5)
WBC: 10.3 10*3/uL (ref 4.0–10.5)

## 2014-09-19 LAB — COMPREHENSIVE METABOLIC PANEL
ALK PHOS: 65 U/L (ref 38–126)
ALT: 19 U/L (ref 14–54)
AST: 23 U/L (ref 15–41)
Albumin: 3.7 g/dL (ref 3.5–5.0)
Anion gap: 5 (ref 5–15)
BILIRUBIN TOTAL: 0.5 mg/dL (ref 0.3–1.2)
BUN: 16 mg/dL (ref 6–20)
CALCIUM: 8.8 mg/dL — AB (ref 8.9–10.3)
CO2: 28 mmol/L (ref 22–32)
CREATININE: 0.46 mg/dL (ref 0.44–1.00)
Chloride: 102 mmol/L (ref 101–111)
Glucose, Bld: 93 mg/dL (ref 65–99)
Potassium: 4.1 mmol/L (ref 3.5–5.1)
Sodium: 135 mmol/L (ref 135–145)
Total Protein: 6.2 g/dL — ABNORMAL LOW (ref 6.5–8.1)

## 2014-09-20 ENCOUNTER — Encounter (HOSPITAL_COMMUNITY): Payer: Self-pay

## 2014-09-20 ENCOUNTER — Encounter (HOSPITAL_BASED_OUTPATIENT_CLINIC_OR_DEPARTMENT_OTHER): Payer: Medicare Other | Admitting: Hematology & Oncology

## 2014-09-20 ENCOUNTER — Encounter (HOSPITAL_BASED_OUTPATIENT_CLINIC_OR_DEPARTMENT_OTHER): Payer: Medicare Other

## 2014-09-20 VITALS — BP 92/49 | HR 91 | Temp 98.1°F | Resp 18 | Wt 114.7 lb

## 2014-09-20 DIAGNOSIS — Z5111 Encounter for antineoplastic chemotherapy: Secondary | ICD-10-CM

## 2014-09-20 DIAGNOSIS — C541 Malignant neoplasm of endometrium: Secondary | ICD-10-CM | POA: Diagnosis not present

## 2014-09-20 DIAGNOSIS — C7801 Secondary malignant neoplasm of right lung: Secondary | ICD-10-CM

## 2014-09-20 DIAGNOSIS — R918 Other nonspecific abnormal finding of lung field: Secondary | ICD-10-CM

## 2014-09-20 LAB — CA 125: CA 125: 25.3 U/mL (ref 0.0–38.1)

## 2014-09-20 MED ORDER — PEGFILGRASTIM 6 MG/0.6ML ~~LOC~~ PSKT
6.0000 mg | PREFILLED_SYRINGE | Freq: Once | SUBCUTANEOUS | Status: AC
  Administered 2014-09-20: 6 mg via SUBCUTANEOUS
  Filled 2014-09-20: qty 0.6

## 2014-09-20 MED ORDER — SODIUM CHLORIDE 0.9 % IV SOLN
Freq: Once | INTRAVENOUS | Status: AC
  Administered 2014-09-20: 09:00:00 via INTRAVENOUS

## 2014-09-20 MED ORDER — FAMOTIDINE IN NACL 20-0.9 MG/50ML-% IV SOLN
20.0000 mg | Freq: Once | INTRAVENOUS | Status: AC
  Administered 2014-09-20: 20 mg via INTRAVENOUS
  Filled 2014-09-20: qty 50

## 2014-09-20 MED ORDER — DIPHENHYDRAMINE HCL 50 MG/ML IJ SOLN
25.0000 mg | Freq: Once | INTRAMUSCULAR | Status: AC
  Administered 2014-09-20: 25 mg via INTRAVENOUS
  Filled 2014-09-20: qty 1

## 2014-09-20 MED ORDER — SODIUM CHLORIDE 0.9 % IV SOLN
Freq: Once | INTRAVENOUS | Status: AC
  Administered 2014-09-20: 09:00:00 via INTRAVENOUS
  Filled 2014-09-20: qty 8

## 2014-09-20 MED ORDER — SODIUM CHLORIDE 0.9 % IV SOLN
347.5000 mg | Freq: Once | INTRAVENOUS | Status: AC
  Administered 2014-09-20: 350 mg via INTRAVENOUS
  Filled 2014-09-20: qty 35

## 2014-09-20 MED ORDER — HEPARIN SOD (PORK) LOCK FLUSH 100 UNIT/ML IV SOLN
500.0000 [IU] | Freq: Once | INTRAVENOUS | Status: AC | PRN
  Administered 2014-09-20: 500 [IU]
  Filled 2014-09-20: qty 5

## 2014-09-20 MED ORDER — SODIUM CHLORIDE 0.9 % IJ SOLN
10.0000 mL | INTRAMUSCULAR | Status: DC | PRN
  Administered 2014-09-20: 10 mL
  Filled 2014-09-20: qty 10

## 2014-09-20 MED ORDER — PACLITAXEL CHEMO INJECTION 300 MG/50ML
175.0000 mg/m2 | Freq: Once | INTRAVENOUS | Status: AC
  Administered 2014-09-20: 258 mg via INTRAVENOUS
  Filled 2014-09-20: qty 43

## 2014-09-20 NOTE — Progress Notes (Signed)
  Alejandra Callahan Discharged ambulatory Tolerated chemotherapy well

## 2014-09-20 NOTE — Progress Notes (Signed)
Labs drawn

## 2014-09-20 NOTE — Progress Notes (Signed)
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 Final immunophenotype c/w endometrial  CURRENT THERAPY: Carboplatin/Taxol  HISTORY OF PRESENTING ILLNESS:  Alejandra Callahan 69 y.o. female is here for further follow-up of stage IV endometrial cancer.  The patient is here for her third chemotherapy treatment. She notes that the muscle pain she had with her first  TAXOL did not occur on cycle #2.  She had a "fever" Sunday night to 99 degrees and mild pain at the base of her right breast.  Overall, she says that she tolerated her last treatment very well.  She denies heart burn.  Her appetite is well.  She questions if immunotherapy will be beneficial to her condition.  She would like to take probiotics.  She has concerns of an occasionally coughing "spell".  She denies neuropathy, skin related issues, and diarrhea.     Endometrial ca   11/28/2012 Initial Diagnosis Endometrial ca, Embx Grade 2   12/09/2012 Surgery IAG3, close follow up   07/05/2014 Imaging Chest Xray- multiple new pulmonary nodules consistent with pulmonary metastases.   07/13/2014 PET scan Multiple hypermetabolic pulmonary nodules and masses of varying size in the lungs bilaterally. Imaging features are compatible with metastatic disease.   07/24/2014 Pathology Results Lung, needle/core biopsy(ies), right - ADENOCARCINOMA.   08/06/2014 Procedure Port placed by IR   08/09/2014 -  Chemotherapy  Carboplatin/Paclitaxel     MEDICAL HISTORY:  Past Medical History  Diagnosis Date  . Cancer     breast  . Arthritis     Rheumatoid arthritis  . PONV (postoperative nausea and vomiting) 1982  . Breast cancer 09/1980    left unilateral mastectomy, no chemo or radiatioin  . Endometrial cancer 2014    SURGICAL HISTORY: Past Surgical History  Procedure Laterality Date  . Mastectomy Left 09/1980  . Tubal ligation  April 1982  . Dilatation & currettage/hysteroscopy with resectocope N/A 11/18/2012    Procedure: DILATATION & CURETTAGE/HYSTEROSCOPY WITH RESECTOCOPE;  Surgeon: Marvene Staff, MD;  Location: [facility];  Service: Gynecology;  Laterality: N/A;  . Breast biopsy Left 09/11/80  . Vaginal hysterectomy  12/09/2012    Complete hysterectomy [facility]    SOCIAL HISTORY: Social History   Social History  . Marital Status: Married    Spouse Name: N/A  . Number of Children: 2  . Years of Education: N/A   Occupational History  . Not on file.   Social History Main Topics  . Smoking status: Never Smoker   . Smokeless tobacco: Never Used  . Alcohol Use: No  . Drug Use: No  . Sexual Activity: Not on file   Other Topics Concern  . Not on file   Social History Narrative  Married, 78 years 2 grandchildren, 2 children Ex-teacher and Microbiologist Non-smoker ETOH, none  FAMILY HISTORY: Family History  Problem Relation Age of Onset  . Breast cancer Mother 47  . Heart attack Mother 11  . Colon cancer Father 20  . Heart attack Father 72  . Breast cancer Sister 50  .  Heart attack Paternal Grandfather 29  . Breast cancer Other     mother's paternal first cousin dx in late 40s-early 33s   indicated that her mother is deceased. She indicated that her father is deceased. She indicated that both of her sisters are alive. She indicated that her maternal grandmother is deceased. She indicated that her maternal grandfather is deceased. She indicated that her paternal  grandmother is deceased. She indicated that her paternal grandfather is deceased. She indicated that her other is deceased.  Mother deceased at 64, heart attack Father deceased at 85, congested heart failure 2 sisters, one with breast cancer  ALLERGIES:  is allergic to codeine; acyclovir and related; other; tuberculin ppd; and tylox.  MEDICATIONS:  Current Outpatient Prescriptions  Medication Sig Dispense Refill  . acetaminophen (TYLENOL) 500 MG tablet Take 500 mg by mouth every 6 (six) hours as needed for moderate pain.     Marland Kitchen alendronate (FOSAMAX) 10 MG tablet Take 70 mg by mouth once a week. Monday    . Alum & Mag Hydroxide-Simeth (MAGIC MOUTHWASH W/LIDOCAINE) SOLN Take 5 mLs by mouth 3 (three) times daily as needed for mouth pain (use as needed for mouth pain). (Patient not taking: Reported on 09/20/2014) 200 mL 3  . Calcium-Vitamin D (CALTRATE 600 PLUS-VIT D PO) Take 1 each by mouth 2 (two) times daily.     Marland Kitchen CARBOPLATIN IV Inject into the vein every 21 ( twenty-one) days. To start 08/08/14    . cholecalciferol (VITAMIN D) 1000 UNITS tablet Take 1,000 Units by mouth daily.    Marland Kitchen dexamethasone (DECADRON) 4 MG tablet Day before chemo: take 5 tablets ('20mg'$ ) in the am and 5 tablets ('20mg'$ ) in the pm. Morning of chemo take 5 tablets ('20mg'$ ). 45 tablet 1  . folic acid (FOLVITE) 914 MCG tablet Take 800 mcg by mouth daily.     . hydroxychloroquine (PLAQUENIL) 200 MG tablet Take by mouth daily.    Marland Kitchen lidocaine-prilocaine (EMLA) cream Apply a quarter size amount to port site 1 hour prior to chemo. Do not rub in. Cover with plastic wrap. 30 g 3  . Melatonin 3 MG CAPS Take 3 mg by mouth at bedtime as needed.    . methotrexate (RHEUMATREX) 2.5 MG tablet Take 2.5 mg by mouth 2 (two) times a week. Caution:Chemotherapy. Protect from light.  Pt to take 4 tablets on Friday and 4 tablets on Saturday    . Multiple Vitamin (MULTIVITAMIN) tablet Take 1 tablet by mouth daily.    . ondansetron (ZOFRAN) 8 MG tablet  Take 1 tablet (8 mg total) by mouth every 8 (eight) hours as needed for nausea or vomiting. 30 tablet 2  . PACLitaxel (TAXOL) 300 MG/50ML injection Inject into the vein every 21 ( twenty-one) days. To start 08/08/14    . Pegfilgrastim (NEULASTA ONPRO [place]) Inject into the skin every 21 ( twenty-one) days. To be given 27 hours after chemo (starting 08/09/14)    . predniSONE (DELTASONE) 5 MG tablet Take 5 mg by mouth daily as needed (flare up).     . prochlorperazine (COMPAZINE) 10 MG tablet Take 1 tablet (10 mg total) by mouth every 6 (six) hours as needed (Nausea or vomiting). (Patient not taking: Reported on 08/30/2014) 30 tablet 2  . raloxifene (EVISTA) 60 MG tablet Take 60 mg by mouth daily.    Marland Kitchen zolpidem (AMBIEN) 10 MG tablet Take 1/2 tab to 1 tab at bedtime as needed for trouble sleeping. 30 tablet 0   Current Facility-Administered  Medications  Medication Dose Route Frequency Provider Last Rate Last Dose  . sodium chloride 0.9 % injection 10 mL  10 mL Intracatheter PRN Patrici Ranks, MD   10 mL at 09/20/14 8119    Review of Systems  Respiratory: Positive for cough, decreased but persistent.   Psychiatric: positive for anxiety All other systems reviewed and are negative.  14 point ROS was done and is otherwise as detailed above or in HPI   PHYSICAL EXAMINATION: ECOG PERFORMANCE STATUS: 0 - Asymptomatic  There were no vitals filed for this visit. There were no vitals filed for this visit.   Physical Exam  Constitutional: She is oriented to person, place, and time and well-developed, well-nourished, and in no distress.  HENT:  Head: Normocephalic and atraumatic.  Nose: Nose normal.  Mouth/Throat: Oropharynx is clear and moist. No oropharyngeal exudate.  Eyes: Conjunctivae and EOM are normal. Pupils are equal, round, and reactive to light. Right eye exhibits no discharge. Left eye exhibits no discharge. No scleral icterus.  Neck: Normal range of motion. Neck supple. No tracheal  deviation present. No thyromegaly present.  Cardiovascular: Normal rate, regular rhythm and normal heart sounds.  Exam reveals no gallop and no friction rub.   No murmur heard. Pulmonary/Chest: Effort normal and breath sounds normal. She has no wheezes. She has no rales.  Abdominal: Soft. Bowel sounds are normal. She exhibits no distension and no mass. There is no tenderness. There is no rebound and no guarding.  Musculoskeletal: Normal range of motion. She exhibits no edema.  Lymphadenopathy:    She has no cervical adenopathy.  Neurological: She is alert and oriented to person, place, and time. She has normal reflexes. No cranial nerve deficit. Gait normal. Coordination normal.  Skin: Skin is warm and dry. No rash noted.  Psychiatric: Mood, memory, affect and judgment normal.  Nursing note and vitals reviewed.   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 She will receive cycle 3 today. We will set her up for repeat imaging prior to cycle 4. I will see her back after her imaging studies to review them and recommend ongoing therapy.  Should she obtain a response as I anticipate she will the goal is to complete a total of 6 cycles.   Orders Placed This Encounter  Procedures  . NM PET  Image Restag (PS) Skull Base To Thigh    Standing Status: Future     Number of Occurrences:      Standing Expiration Date: 09/20/2015    Order Specific Question:  Reason for Exam (SYMPTOM  OR DIAGNOSIS REQUIRED)    Answer:  stage IV endometrial, restaging    Order Specific Question:  Preferred imaging location?    Answer:  [facility]  . [place] COMMUNICATION    Chemotherapy Appointment - 4.5 hr  .  TREATMENT CONDITIONS    Patient should have CBC & CMP within 7 days prior to chemotherapy administration. NOTIFY MD IF: ANC < 1500, Hemoglobin < 8, PLT < 100,000,  Total Bili > 1.5, Creatinine > 1.5, ALT & AST > 80 or if patient has unstable vital signs: Temperature > 38.5, SBP > 180 or < 90, RR > 30 or HR > 100.   All questions were answered. The patient knows to call the clinic with any problems, questions or concerns.   This document serves as a record of services personally performed by Ancil Linsey, MD. It was created on her behalf by Janace Hoard, a trained medical scribe. The creation of this record is based on the scribe's personal observations and the provider's statements to them. This document has been checked and approved by the attending provider.  I have reviewed the above documentation for accuracy and completeness, and I agree with the above.  This note was electronically signed.    Kelby Fam. Whitney Muse, MD

## 2014-09-20 NOTE — Patient Instructions (Signed)
..  [place] at [facility] Discharge Instructions  RECOMMENDATIONS MADE BY THE CONSULTANT AND ANY TEST RESULTS WILL BE SENT TO YOUR REFERRING PHYSICIAN.  PET scan in [place] on 10/3 and you must be there by 12 noon  Return in 3 weeks  Thank you for choosing [place] at [facility] to provide your oncology and hematology care.  To afford each patient quality time with our provider, please arrive at least 15 minutes before your scheduled appointment time.    You need to re-schedule your appointment should you arrive 10 or more minutes late.  We strive to give you quality time with our providers, and arriving late affects you and other patients whose appointments are after yours.  Also, if you no show three or more times for appointments you may be dismissed from the clinic at the providers discretion.     Again, thank you for choosing [facility]  Our hope is that these requests will decrease the amount of time that you wait before being seen by our physicians.       _____________________________________________________________  Should you have questions after your visit to [facility], please contact our office at [phone] between the hours of 8:30 a.m. and 4:30 p.m.  Voicemails left after 4:30 p.m. will not be returned until the following business day.  For prescription refill requests, have your pharmacy contact our office.

## 2014-09-20 NOTE — Patient Instructions (Signed)
[place] at [facility] Discharge Instructions  RECOMMENDATIONS MADE BY THE CONSULTANT AND ANY TEST RESULTS WILL BE SENT TO YOUR REFERRING PHYSICIAN.  Chemotherapy as scheduled today Neulasta placed. Return as scheduled Please call the clinic if you have any questions or concerns  Thank you for choosing [place] at [facility] to provide your oncology and hematology care.  To afford each patient quality time with our provider, please arrive at least 15 minutes before your scheduled appointment time.    You need to re-schedule your appointment should you arrive 10 or more minutes late.  We strive to give you quality time with our providers, and arriving late affects you and other patients whose appointments are after yours.  Also, if you no show three or more times for appointments you may be dismissed from the clinic at the providers discretion.     Again, thank you for choosing [facility].  Our hope is that these requests will decrease the amount of time that you wait before being seen by our physicians.       _____________________________________________________________  Should you have questions after your visit to [facility], please contact our office at [phone] between the hours of 8:30 a.m. and 4:30 p.m.  Voicemails left after 4:30 p.m. will not be returned until the following business day.  For prescription refill requests, have your pharmacy contact our office.

## 2014-09-21 ENCOUNTER — Encounter (HOSPITAL_COMMUNITY): Payer: Self-pay | Admitting: Hematology & Oncology

## 2014-09-26 ENCOUNTER — Ambulatory Visit: Payer: 59 | Admitting: Gynecologic Oncology

## 2014-10-01 ENCOUNTER — Encounter (HOSPITAL_COMMUNITY): Payer: Self-pay | Admitting: Hematology & Oncology

## 2014-10-08 ENCOUNTER — Ambulatory Visit
Admission: RE | Admit: 2014-10-08 | Discharge: 2014-10-08 | Disposition: A | Discharge status: Home / Self Care | Payer: Medicare Other | Source: Ambulatory Visit | Attending: Hematology & Oncology | Admitting: Hematology & Oncology

## 2014-10-08 DIAGNOSIS — C541 Malignant neoplasm of endometrium: Secondary | ICD-10-CM

## 2014-10-10 ENCOUNTER — Encounter (HOSPITAL_COMMUNITY): Payer: Medicare Other | Attending: Hematology & Oncology

## 2014-10-10 ENCOUNTER — Encounter
Admission: RE | Admit: 2014-10-10 | Discharge: 2014-10-10 | Disposition: A | Discharge status: Home / Self Care | Payer: Medicare Other | Source: Ambulatory Visit | Attending: Hematology & Oncology | Admitting: Hematology & Oncology

## 2014-10-10 DIAGNOSIS — C7800 Secondary malignant neoplasm of unspecified lung: Secondary | ICD-10-CM | POA: Diagnosis not present

## 2014-10-10 DIAGNOSIS — R918 Other nonspecific abnormal finding of lung field: Secondary | ICD-10-CM | POA: Diagnosis present

## 2014-10-10 DIAGNOSIS — Z853 Personal history of malignant neoplasm of breast: Secondary | ICD-10-CM | POA: Insufficient documentation

## 2014-10-10 DIAGNOSIS — C541 Malignant neoplasm of endometrium: Secondary | ICD-10-CM | POA: Insufficient documentation

## 2014-10-10 DIAGNOSIS — M069 Rheumatoid arthritis, unspecified: Secondary | ICD-10-CM | POA: Insufficient documentation

## 2014-10-10 DIAGNOSIS — Z79899 Other long term (current) drug therapy: Secondary | ICD-10-CM | POA: Diagnosis not present

## 2014-10-10 LAB — COMPREHENSIVE METABOLIC PANEL
ALT: 18 U/L (ref 14–54)
ANION GAP: 7 (ref 5–15)
AST: 22 U/L (ref 15–41)
Albumin: 3.7 g/dL (ref 3.5–5.0)
Alkaline Phosphatase: 66 U/L (ref 38–126)
BUN: 13 mg/dL (ref 6–20)
CHLORIDE: 103 mmol/L (ref 101–111)
CO2: 29 mmol/L (ref 22–32)
Calcium: 8.8 mg/dL — ABNORMAL LOW (ref 8.9–10.3)
Creatinine, Ser: 0.51 mg/dL (ref 0.44–1.00)
Glucose, Bld: 79 mg/dL (ref 65–99)
POTASSIUM: 3.6 mmol/L (ref 3.5–5.1)
SODIUM: 139 mmol/L (ref 135–145)
Total Bilirubin: 0.5 mg/dL (ref 0.3–1.2)
Total Protein: 6.1 g/dL — ABNORMAL LOW (ref 6.5–8.1)

## 2014-10-10 LAB — CBC WITH DIFFERENTIAL/PLATELET
Basophils Absolute: 0 10*3/uL (ref 0.0–0.1)
Basophils Relative: 1 %
EOS ABS: 0.1 10*3/uL (ref 0.0–0.7)
EOS PCT: 2 %
HCT: 33.1 % — ABNORMAL LOW (ref 36.0–46.0)
Hemoglobin: 11.2 g/dL — ABNORMAL LOW (ref 12.0–15.0)
LYMPHS ABS: 1.3 10*3/uL (ref 0.7–4.0)
LYMPHS PCT: 19 %
MCH: 34.6 pg — AB (ref 26.0–34.0)
MCHC: 33.8 g/dL (ref 30.0–36.0)
MCV: 102.2 fL — AB (ref 78.0–100.0)
MONO ABS: 0.8 10*3/uL (ref 0.1–1.0)
Monocytes Relative: 12 %
Neutro Abs: 4.5 10*3/uL (ref 1.7–7.7)
Neutrophils Relative %: 66 %
PLATELETS: 245 10*3/uL (ref 150–400)
RBC: 3.24 MIL/uL — AB (ref 3.87–5.11)
RDW: 17.6 % — AB (ref 11.5–15.5)
WBC: 6.8 10*3/uL (ref 4.0–10.5)

## 2014-10-10 LAB — GLUCOSE, CAPILLARY: Glucose-Capillary: 67 mg/dL (ref 65–99)

## 2014-10-10 MED ORDER — FLUDEOXYGLUCOSE F - 18 (FDG) INJECTION
12.7500 | Freq: Once | INTRAVENOUS | Status: DC | PRN
  Administered 2014-10-10: 12.75 via INTRAVENOUS
  Filled 2014-10-10: qty 12.75

## 2014-10-11 ENCOUNTER — Encounter (HOSPITAL_COMMUNITY): Payer: Self-pay | Admitting: Hematology & Oncology

## 2014-10-11 ENCOUNTER — Encounter (HOSPITAL_BASED_OUTPATIENT_CLINIC_OR_DEPARTMENT_OTHER): Payer: Medicare Other

## 2014-10-11 ENCOUNTER — Encounter (HOSPITAL_BASED_OUTPATIENT_CLINIC_OR_DEPARTMENT_OTHER): Payer: Medicare Other | Admitting: Hematology & Oncology

## 2014-10-11 VITALS — BP 106/56 | HR 99 | Temp 98.1°F | Resp 16 | Wt 114.3 lb

## 2014-10-11 VITALS — BP 106/53 | HR 87 | Temp 97.8°F | Resp 16

## 2014-10-11 DIAGNOSIS — C541 Malignant neoplasm of endometrium: Secondary | ICD-10-CM

## 2014-10-11 DIAGNOSIS — Z853 Personal history of malignant neoplasm of breast: Secondary | ICD-10-CM

## 2014-10-11 DIAGNOSIS — M79673 Pain in unspecified foot: Secondary | ICD-10-CM

## 2014-10-11 DIAGNOSIS — C7800 Secondary malignant neoplasm of unspecified lung: Secondary | ICD-10-CM

## 2014-10-11 DIAGNOSIS — Z5111 Encounter for antineoplastic chemotherapy: Secondary | ICD-10-CM | POA: Diagnosis not present

## 2014-10-11 MED ORDER — HEPARIN SOD (PORK) LOCK FLUSH 100 UNIT/ML IV SOLN
INTRAVENOUS | Status: AC
  Filled 2014-10-11: qty 5

## 2014-10-11 MED ORDER — PACLITAXEL CHEMO INJECTION 300 MG/50ML
175.0000 mg/m2 | Freq: Once | INTRAVENOUS | Status: AC
  Administered 2014-10-11: 258 mg via INTRAVENOUS
  Filled 2014-10-11: qty 43

## 2014-10-11 MED ORDER — SODIUM CHLORIDE 0.9 % IV SOLN
Freq: Once | INTRAVENOUS | Status: AC
  Administered 2014-10-11: 09:00:00 via INTRAVENOUS

## 2014-10-11 MED ORDER — DIPHENHYDRAMINE HCL 50 MG/ML IJ SOLN
25.0000 mg | Freq: Once | INTRAMUSCULAR | Status: AC
  Administered 2014-10-11: 25 mg via INTRAVENOUS
  Filled 2014-10-11: qty 1

## 2014-10-11 MED ORDER — FAMOTIDINE IN NACL 20-0.9 MG/50ML-% IV SOLN
20.0000 mg | Freq: Once | INTRAVENOUS | Status: AC
  Administered 2014-10-11: 20 mg via INTRAVENOUS
  Filled 2014-10-11: qty 50

## 2014-10-11 MED ORDER — SODIUM CHLORIDE 0.9 % IJ SOLN
10.0000 mL | INTRAMUSCULAR | Status: DC | PRN
  Administered 2014-10-11: 10 mL
  Filled 2014-10-11: qty 10

## 2014-10-11 MED ORDER — PEGFILGRASTIM 6 MG/0.6ML ~~LOC~~ PSKT
6.0000 mg | PREFILLED_SYRINGE | Freq: Once | SUBCUTANEOUS | Status: AC
  Administered 2014-10-11: 6 mg via SUBCUTANEOUS
  Filled 2014-10-11: qty 0.6

## 2014-10-11 MED ORDER — HEPARIN SOD (PORK) LOCK FLUSH 100 UNIT/ML IV SOLN
500.0000 [IU] | Freq: Once | INTRAVENOUS | Status: AC | PRN
Start: 2014-10-11 — End: 2014-10-11
  Administered 2014-10-11: 500 [IU]

## 2014-10-11 MED ORDER — SODIUM CHLORIDE 0.9 % IV SOLN
344.5000 mg | Freq: Once | INTRAVENOUS | Status: AC
  Administered 2014-10-11: 340 mg via INTRAVENOUS
  Filled 2014-10-11: qty 34

## 2014-10-11 MED ORDER — SODIUM CHLORIDE 0.9 % IV SOLN
Freq: Once | INTRAVENOUS | Status: AC
  Administered 2014-10-11: 09:00:00 via INTRAVENOUS
  Filled 2014-10-11: qty 8

## 2014-10-11 NOTE — Progress Notes (Signed)
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 Final immunophenotype c/w endometrial  CURRENT THERAPY: Carboplatin/Taxol  HISTORY OF PRESENTING ILLNESS:  Alejandra Callahan 69 y.o. female is here for further follow-up of stage IV endometrial cancer.  The patient is here with several family members.  She presents for treatment.  The results of her recent scans were discussed.  She no longer experiences nausea.  She reports that the bottom of her feet ache.  She notes that this occurs especially when she is on her feet for "many hours" which she describes as "all day." It does not limit her nor does it occur daily.  Denies neuropathy.  Denies mouth sores.    She is here for Cycle #4 of Carboplatin/Taxol.      Endometrial ca ([place])   11/28/2012 Initial Diagnosis Endometrial ca, Embx Grade 2   12/09/2012 Surgery IAG3, close follow up   07/05/2014 Imaging Chest Xray- multiple new pulmonary nodules consistent with pulmonary metastases.   07/13/2014 PET scan Multiple hypermetabolic pulmonary nodules and masses of varying size in the lungs bilaterally. Imaging features are compatible with metastatic disease.   07/24/2014 Pathology Results Lung, needle/core biopsy(ies), right - ADENOCARCINOMA.   08/06/2014 Procedure Port placed by IR   08/09/2014 -  Chemotherapy Carboplatin/Paclitaxel     MEDICAL HISTORY:  Past Medical History  Diagnosis Date  . Cancer (HCC)    breast  . Arthritis     Rheumatoid arthritis  . PONV (postoperative nausea and vomiting) 1982  . Breast cancer ([place]) 09/1980    left unilateral mastectomy, no chemo or radiatioin  . Endometrial cancer ([place]) 2014    SURGICAL HISTORY: Past Surgical History  Procedure Laterality Date  . Mastectomy Left 09/1980  . Tubal ligation  April 1982  . Dilatation & currettage/hysteroscopy with resectocope N/A 11/18/2012    Procedure: DILATATION & CURETTAGE/HYSTEROSCOPY WITH RESECTOCOPE;  Surgeon: Marvene Staff, MD;  Location: [facility];  Service: Gynecology;  Laterality: N/A;  . Breast biopsy Left 09/11/80  . Vaginal hysterectomy  12/09/2012    Complete hysterectomy [facility]    SOCIAL HISTORY: Social History   Social History  . Marital Status: Married    Spouse Name: N/A  . Number of Children: 2  . Years of Education: N/A   Occupational History  . Not on file.   Social History Main Topics  . Smoking status: Never Smoker   . Smokeless tobacco: Never Used  . Alcohol Use: No  . Drug Use: No  . Sexual Activity: Not on file   Other Topics Concern  . Not on file   Social History Narrative  Married, 33 years 2 grandchildren, 2 children Ex-teacher and Microbiologist Non-smoker ETOH, none  FAMILY HISTORY: Family History  Problem Relation Age of Onset  . Breast cancer Mother 41  . Heart attack Mother 82  . Colon cancer Father 9  . Heart attack Father 52  . Breast cancer Sister 19  . Heart attack Paternal Grandfather 36  . Breast cancer Other  mother's paternal first cousin dx in late 40s-early 17s   indicated that her mother is deceased. She indicated that her father is deceased. She indicated that both of her sisters are alive. She indicated that her maternal grandmother is deceased. She indicated that her maternal grandfather is deceased. She indicated that her paternal grandmother is deceased. She indicated that her paternal grandfather is deceased. She  indicated that her other is deceased.  Mother deceased at 26, heart attack Father deceased at 86, congested heart failure 2 sisters, one with breast cancer  ALLERGIES:  is allergic to codeine; acyclovir and related; other; tuberculin ppd; and tylox.  MEDICATIONS:  Current Outpatient Prescriptions  Medication Sig Dispense Refill  . alendronate (FOSAMAX) 10 MG tablet Take 70 mg by mouth once a week. Monday    . Calcium-Vitamin D (CALTRATE 600 PLUS-VIT D PO) Take 1 each by mouth 2 (two) times daily.     Marland Kitchen CARBOPLATIN IV Inject into the vein every 21 ( twenty-one) days. To start 08/08/14    . cholecalciferol (VITAMIN D) 1000 UNITS tablet Take 1,000 Units by mouth daily.    Marland Kitchen dexamethasone (DECADRON) 4 MG tablet Day before chemo: take 5 tablets ('20mg'$ ) in the am and 5 tablets ('20mg'$ ) in the pm. Morning of chemo take 5 tablets ('20mg'$ ). 45 tablet 1  . folic acid (FOLVITE) 474 MCG tablet Take 800 mcg by mouth daily.     . hydroxychloroquine (PLAQUENIL) 200 MG tablet Take by mouth daily.    Marland Kitchen lidocaine-prilocaine (EMLA) cream Apply a quarter size amount to port site 1 hour prior to chemo. Do not rub in. Cover with plastic wrap. 30 g 3  . Melatonin 3 MG CAPS Take 3 mg by mouth at bedtime as needed.    . methotrexate (RHEUMATREX) 2.5 MG tablet Take 2.5 mg by mouth 2 (two) times a week. Caution:Chemotherapy. Protect from light.  Pt to take 4 tablets on Friday and 4 tablets on Saturday    . Multiple Vitamin (MULTIVITAMIN) tablet Take 1 tablet by mouth daily.    Marland Kitchen PACLitaxel (TAXOL) 300 MG/50ML injection Inject into the vein every 21 ( twenty-one) days. To start 08/08/14    . Pegfilgrastim (NEULASTA ONPRO [place]) Inject into the skin every 21 ( twenty-one) days. To be given 27 hours after chemo (starting 08/09/14)    . raloxifene (EVISTA) 60 MG tablet Take 60 mg by mouth daily.    Marland Kitchen zolpidem (AMBIEN) 10 MG tablet Take 1/2 tab to 1 tab at bedtime as needed for trouble sleeping. 30 tablet 0  . acetaminophen (TYLENOL)  500 MG tablet Take 500 mg by mouth every 6 (six) hours as needed for moderate pain.     Marland Kitchen Alum & Mag Hydroxide-Simeth (MAGIC MOUTHWASH W/LIDOCAINE) SOLN Take 5 mLs by mouth 3 (three) times daily as needed for mouth pain (use as needed for mouth pain). (Patient not taking: Reported on 09/20/2014) 200 mL 3  . ondansetron (ZOFRAN) 8 MG tablet Take 1 tablet (8 mg total) by mouth every 8 (eight) hours as needed for nausea or vomiting. (Patient not taking: Reported on 10/11/2014) 30 tablet 2  . predniSONE (DELTASONE) 5 MG tablet Take 5 mg by mouth daily as needed (flare up).     . prochlorperazine (COMPAZINE) 10 MG tablet Take 1 tablet (10 mg total) by mouth every 6 (six) hours as needed (Nausea or vomiting). (Patient not taking: Reported on 10/11/2014) 30 tablet 2   No current facility-administered medications for this visit.   Facility-Administered  Medications Ordered in Other Visits  Medication Dose Route Frequency Provider Last Rate Last Dose  . 0.9 %  sodium chloride infusion   Intravenous Once Patrici Ranks, MD      . CARBOplatin (PARAPLATIN) 340 mg in sodium chloride 0.9 % 100 mL chemo infusion  340 mg Intravenous Once Patrici Ranks, MD      . diphenhydrAMINE (BENADRYL) injection 25 mg  25 mg Intravenous Once Patrici Ranks, MD      . famotidine (PEPCID) IVPB 20 mg premix  20 mg Intravenous Once Patrici Ranks, MD      . fludeoxyglucose F - 18 (FDG) injection 12.75 milli Curie  12.75 milli Curie Intravenous Once PRN Medication Radiologist, MD   12.75 milli Curie at 10/10/14 1033  . heparin lock flush 100 unit/mL  500 Units Intracatheter Once PRN Patrici Ranks, MD      . ondansetron (ZOFRAN) 16 mg, dexamethasone (DECADRON) 20 mg in sodium chloride 0.9 % 50 mL IVPB   Intravenous Once Patrici Ranks, MD      . PACLitaxel (TAXOL) 258 mg in dextrose 5 % 250 mL chemo infusion (> '80mg'$ /m2)  175 mg/m2 (Treatment Plan Actual) Intravenous Once Patrici Ranks, MD      . sodium chloride  0.9 % injection 10 mL  10 mL Intracatheter PRN Patrici Ranks, MD        Review of Systems  Respiratory: Positive for cough, decreased but persistent.   Psychiatric: positive for anxiety All other systems reviewed and are negative.  14 point ROS was done and is otherwise as detailed above or in HPI    PHYSICAL EXAMINATION: ECOG PERFORMANCE STATUS: 0 - Asymptomatic  Filed Vitals:   10/11/14 0800  BP: 106/56  Pulse: 99  Temp: 98.1 F (36.7 C)  Resp: 16   Filed Weights   10/11/14 0800  Weight: 114 lb 4.8 oz (51.846 kg)     Physical Exam  Constitutional: She is oriented to person, place, and time and well-developed, well-nourished, and in no distress. ALopecia HENT:  Head: Normocephalic and atraumatic.  Nose: Nose normal.  Mouth/Throat: Oropharynx is clear and moist. No oropharyngeal exudate.  Eyes: Conjunctivae and EOM are normal. Pupils are equal, round, and reactive to light. Right eye exhibits no discharge. Left eye exhibits no discharge. No scleral icterus.  Neck: Normal range of motion. Neck supple. No tracheal deviation present. No thyromegaly present.  Cardiovascular: Normal rate, regular rhythm and normal heart sounds.  Exam reveals no gallop and no friction rub.   No murmur heard. Pulmonary/Chest: Effort normal and breath sounds normal. She has no wheezes. She has no rales.  Abdominal: Soft. Bowel sounds are normal. She exhibits no distension and no mass. There is no tenderness. There is no rebound and no guarding.  Musculoskeletal: Normal range of motion. She exhibits no edema.  Lymphadenopathy:    She has no cervical adenopathy.  Neurological: She is alert and oriented to person, place, and time. She has normal reflexes. No cranial nerve deficit. Gait normal. Coordination normal.  Skin: Skin is warm and dry. No rash noted.  Psychiatric: Mood, memory, affect and judgment normal.  Nursing note and vitals reviewed.  RADIOLOGY:  Study Result     CLINICAL  DATA: Subsequent encounter for treatment strategy for endometrial cancer with pulmonary nodules.  EXAM: NUCLEAR MEDICINE PET SKULL BASE TO THIGH  TECHNIQUE: 12.8 mCi F-18 FDG was injected intravenously. Full-ring PET imaging was performed from the skull base to  thigh after the radiotracer. CT data was obtained and used for attenuation correction and anatomic localization.  FASTING BLOOD GLUCOSE: Value: 67 mg/dl  COMPARISON: 07/13/2014.  FINDINGS: NECK  No hypermetabolic lymph nodes in the neck.  CHEST  No hypermetabolic mediastinal or hilar nodes. As before, the patient has bilateral hypermetabolic pulmonary nodules, but these have decreased in size and hypermetabolism in the interval. The dominant right lower lobe pulmonary nodule which was 2.9 x 3.7 cm on the previous study now measures 1.2 x 1.5 cm. On the previous exam, SUV max = 12.5 for this lesion compared to 1.8 today. 1.6 cm right upper lobe nodule on the previous study now measures 0.5 cm. SUV max = 1.4 for this nodule today compared to 13.2 previously. No new or progressive pulmonary nodule is evident.  Right-sided Port-A-Cath tip is positioned in the upper right atrium.  ABDOMEN/PELVIS  No abnormal hypermetabolic activity within the liver, pancreas, adrenal glands, or spleen. No hypermetabolic lymph nodes in the abdomen or pelvis.  Stable 2.5 cm lesion in the inferior right liver, non hypermetabolic There is abdominal aortic atherosclerosis without aneurysm.  SKELETON  No focal areas of hypermetabolism within the bony anatomy. The patient does demonstrate diffusely increased FDG uptake within the marrow spaces, likely secondary to stimulatory effects of therapy.  IMPRESSION: 1. Interval response to therapy with decrease in size and hypermetabolism of the patient's multiple pulmonary nodules. No new or progressive pulmonary lesions. 2. Diffuse marrow uptake, presumably secondary to  therapy. 3. Abdominal aortic atherosclerosis.   Electronically Signed  By: Misty Stanley M.D.  On: 10/10/2014 15:10     LABORATORY DATA:  I have reviewed the data as listed Lab Results  Component Value Date   WBC 6.8 10/10/2014   HGB 11.2* 10/10/2014   HCT 33.1* 10/10/2014   MCV 102.2* 10/10/2014   PLT 245 10/10/2014   CMP     Component Value Date/Time   NA 139 10/10/2014 0848   K 3.6 10/10/2014 0848   CL 103 10/10/2014 0848   CO2 29 10/10/2014 0848   GLUCOSE 79 10/10/2014 0848   BUN 13 10/10/2014 0848   CREATININE 0.51 10/10/2014 0848   CALCIUM 8.8* 10/10/2014 0848   PROT 6.1* 10/10/2014 0848   ALBUMIN 3.7 10/10/2014 0848   AST 22 10/10/2014 0848   ALT 18 10/10/2014 0848   ALKPHOS 66 10/10/2014 0848   BILITOT 0.5 10/10/2014 0848   GFRNONAA >60 10/10/2014 0848   GFRAA >60 10/10/2014 0848      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 Abnormal CXR History of breast cancer diagnosed and treated in 1982 Radical mastectomy with lymphadenectomy History of endometrial carcinoma, endometrioid adenocarcinoma with squamous differentiation, Stage IA Grade 3  12/09/2012 RATLH/BSO/PPALND with SLN per [facility] protocol 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 She will receive cycle 4 today of carboplatin and Taxol. Reviewed her PET/CT with the patient and her family, they are very pleased with the results. We will complete 6 cycles. We will monitor her closely for neuropathy. She is well aware to call in the interim with any problems or concerns.  She will return in 3 weeks prior to cycle #5. Plan on repeat imaging at the conclusion of  therapy.  All questions were answered. The patient knows to call the clinic with any problems, questions or concerns.   This document serves as a record of services personally performed by Ancil Linsey, MD. It was created on her behalf by Janace Hoard, a trained medical scribe. The creation of this record is based on the scribe's personal observations and the provider's statements to them. This document has been checked and approved by the attending provider.  I have reviewed the above documentation for accuracy and completeness, and I agree with the above.  This note was electronically signed.    Kelby Fam. Whitney Muse, MD

## 2014-10-11 NOTE — Progress Notes (Signed)
Tolerated chemo well. Alejandra Callahan KitchenBenjamine Callahan arrived today for [facility] neulasta on body injector. See MAR for administration details. Injector in place and engaged with green light indicator on flashing. Tolerated application with out problems.

## 2014-10-11 NOTE — Progress Notes (Signed)
Labs drawn

## 2014-10-11 NOTE — Patient Instructions (Signed)
[place] at [facility] Discharge Instructions  RECOMMENDATIONS MADE BY THE CONSULTANT AND ANY TEST RESULTS WILL BE SENT TO YOUR REFERRING PHYSICIAN.  Exam and discussion by Dr. Whitney Muse. Your scan is improved. Report fevers uncontrolled nausea, vomiting, diarrhea or other concerns. Follow-up in 3 weeks.   Thank you for choosing [place] at [facility] to provide your oncology and hematology care.  To afford each patient quality time with our provider, please arrive at least 15 minutes before your scheduled appointment time.    You need to re-schedule your appointment should you arrive 10 or more minutes late.  We strive to give you quality time with our providers, and arriving late affects you and other patients whose appointments are after yours.  Also, if you no show three or more times for appointments you may be dismissed from the clinic at the providers discretion.     Again, thank you for choosing [facility].  Our hope is that these requests will decrease the amount of time that you wait before being seen by our physicians.       _____________________________________________________________  Should you have questions after your visit to [facility], please contact our office at [phone] between the hours of 8:30 a.m. and 4:30 p.m.  Voicemails left after 4:30 p.m. will not be returned until the following business day.  For prescription refill requests, have your pharmacy contact our office.

## 2014-10-11 NOTE — Patient Instructions (Signed)
[facility] Discharge Instructions for Patients Receiving Chemotherapy  Today you received the following chemotherapy agents Taxol and Carbo. Neulasta OnPro Body injector is placed on your upper arm. The medication will be delivered between 430 pm and 530 pm tomorrow October 7th. You may safely remove injector after 530 pm tomorrow or when the indicator window reads EMPTY and the indicator light is no longer flashing.  To help prevent nausea and vomiting after your treatment, we encourage you to take your nausea medication as instructed. If you develop nausea and vomiting that is not controlled by your nausea medication, call the clinic. If it is after clinic hours your family physician or the after hours number for the clinic or go to the Emergency Department. BELOW ARE SYMPTOMS THAT SHOULD BE REPORTED IMMEDIATELY:  *FEVER GREATER THAN 101.0 F  *CHILLS WITH OR WITHOUT FEVER  NAUSEA AND VOMITING THAT IS NOT CONTROLLED WITH YOUR NAUSEA MEDICATION  *UNUSUAL SHORTNESS OF BREATH  *UNUSUAL BRUISING OR BLEEDING  TENDERNESS IN MOUTH AND THROAT WITH OR WITHOUT PRESENCE OF ULCERS  *URINARY PROBLEMS  *BOWEL PROBLEMS  UNUSUAL RASH Items with * indicate a potential emergency and should be followed up as soon as possible.  Return as scheduled.  I have been informed and understand all the instructions given to me. I know to contact the clinic, my physician, or go to the Emergency Department if any problems should occur. I do not have any questions at this time, but understand that I may call the clinic during office hours or the Patient Navigator at [phone] should I have any questions or need assistance in obtaining follow up care.    __________________________________________  _____________  __________ Signature of Patient or Authorized Representative            Date                   Time    __________________________________________ Nurse's Signature

## 2014-10-19 ENCOUNTER — Telehealth (HOSPITAL_COMMUNITY): Payer: Self-pay | Admitting: *Deleted

## 2014-10-19 NOTE — Telephone Encounter (Signed)
Patient notified that she did not worry about vaginal discharge unless it became odorous, itchy, or burning per Dr. Whitney Muse. Patient states that she does not have any of those signs or symptoms. Patient states that x 1 week she has been having yellow discharge that amounts to about her ring fingernail in size. This is the amount in her underwear over the entire day. Patient said ok regarding what Dr. Whitney Muse said about her discharge. Pt states that today has been the best day that she has had. She does state that her feet tingle and burn.

## 2014-10-30 NOTE — Progress Notes (Signed)
      Zack Hall, MD [address] [place] [address]  Endometrial ca (HCC) - Plan: CBC with Differential, Comprehensive metabolic panel, NM PET Image Restag (PS) Skull Base To Thigh, CANCELED: NM PET Image Restag (PS) Skull Base To Thigh  CURRENT THERAPY: Carboplatin/Paclitaxel  INTERVAL HISTORY: Alejandra Callahan 69 y.o. female returns for followup of Stage IV endometrial cancer.    Endometrial ca (HCC)   11/28/2012 Initial Diagnosis Endometrial ca, Embx Grade 2   12/09/2012 Surgery IAG3, close follow up   07/05/2014 Imaging Chest Xray- multiple new pulmonary nodules consistent with pulmonary metastases.   07/13/2014 PET scan Multiple hypermetabolic pulmonary nodules and masses of varying size in the lungs bilaterally. Imaging features are compatible with metastatic disease.   07/24/2014 Pathology Results Lung, needle/core biopsy(ies), right - ADENOCARCINOMA.   08/06/2014 Procedure Port placed by IR   08/09/2014 -  Chemotherapy Carboplatin/Paclitaxel   10/10/2014 PET scan Interval response to therapy with decrease in size and hypermetabolism of the patient's multiple pulmonary nodules. No new or progressive pulmonary lesions.   I personally reviewed and went over laboratory results with the patient.  The results are noted within this dictation.  She is tolerating treatment well.  She notes minimal PN that resolves prior to her next cycle of chemotherapy.  She admits that it is resolved today.  She notes that it is only in her toes, not in her fingers.  She is educated to keep us in the loop on this common side effect of therapy.  If progressive, I would not hesitate to try Cymbalta.  She denies any nausea or vomiting.  She deies any complaints today.    Past Medical History  Diagnosis Date  . Cancer (HCC)     breast  . Arthritis     Rheumatoid arthritis  . PONV (postoperative nausea and vomiting) 1982  . Breast cancer (HCC) 09/1980    left unilateral mastectomy, no chemo or  radiatioin  . 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     is allergic to codeine; acyclovir and related; other; tuberculin ppd; and tylox.  Current Outpatient Prescriptions on File Prior to Visit  Medication Sig Dispense Refill  . acetaminophen (TYLENOL) 500 MG tablet Take 500 mg by mouth every 6 (six) hours as needed for moderate pain.     . alendronate (FOSAMAX) 10 MG tablet Take 70 mg by mouth once a week. Monday    . Alum & Mag Hydroxide-Simeth (MAGIC MOUTHWASH W/LIDOCAINE) SOLN Take 5 mLs by mouth 3 (three) times daily as needed for mouth pain (use as needed for mouth pain). (Patient not taking: Reported on 09/20/2014) 200 mL 3  . Calcium-Vitamin D (CALTRATE 600 PLUS-VIT D PO) Take 1 each by mouth 2 (two) times daily.     . CARBOPLATIN IV Inject into the vein every 21 ( twenty-one) days. To start 08/08/14    . cholecalciferol (VITAMIN D) 1000 UNITS tablet Take 1,000 Units by mouth daily.    . dexamethasone (DECADRON) 4 MG tablet Day before chemo: take 5 tablets (20mg) in the am and 5 tablets (20mg) in the pm. Morning of chemo take 5 tablets (20mg). 45 tablet 1  . folic acid (FOLVITE) 800 MCG tablet Take 800 mcg by mouth daily.     . hydroxychloroquine (PLAQUENIL) 200 MG tablet Take by mouth daily.    . lidocaine-prilocaine (EMLA) cream   Apply a quarter size amount to port site 1 hour prior to chemo. Do not rub in. Cover with plastic wrap. 30 g 3  . Melatonin 3 MG CAPS Take 3 mg by mouth at bedtime as needed.    . methotrexate (RHEUMATREX) 2.5 MG tablet Take 2.5 mg by mouth 2 (two) times a week. Caution:Chemotherapy. Protect from light.  Pt to take 4 tablets on Friday and 4 tablets on Saturday    . Multiple Vitamin (MULTIVITAMIN) tablet Take 1 tablet by mouth daily.    . ondansetron (ZOFRAN) 8 MG tablet Take 1 tablet (8 mg total) by mouth every 8 (eight) hours as needed for nausea or  vomiting. (Patient not taking: Reported on 10/11/2014) 30 tablet 2  . PACLitaxel (TAXOL) 300 MG/50ML injection Inject into the vein every 21 ( twenty-one) days. To start 08/08/14    . Pegfilgrastim (NEULASTA ONPRO [place]) Inject into the skin every 21 ( twenty-one) days. To be given 27 hours after chemo (starting 08/09/14)    . predniSONE (DELTASONE) 5 MG tablet Take 5 mg by mouth daily as needed (flare up).     . prochlorperazine (COMPAZINE) 10 MG tablet Take 1 tablet (10 mg total) by mouth every 6 (six) hours as needed (Nausea or vomiting). (Patient not taking: Reported on 10/11/2014) 30 tablet 2  . raloxifene (EVISTA) 60 MG tablet Take 60 mg by mouth daily.    Marland Kitchen zolpidem (AMBIEN) 10 MG tablet Take 1/2 tab to 1 tab at bedtime as needed for trouble sleeping. 30 tablet 0   Current Facility-Administered Medications on File Prior to Visit  Medication Dose Route Frequency Provider Last Rate Last Dose  . CARBOplatin (PARAPLATIN) 340 mg in sodium chloride 0.9 % 100 mL chemo infusion  340 mg Intravenous Once Patrici Ranks, MD      . heparin lock flush 100 unit/mL  500 Units Intracatheter Once PRN Patrici Ranks, MD      . PACLitaxel (TAXOL) 258 mg in dextrose 5 % 250 mL chemo infusion (> 51m/m2)  175 mg/m2 (Treatment Plan Actual) Intravenous Once SPatrici Ranks MD 98 mL/hr at 11/01/14 0939 258 mg at 11/01/14 0939  . pegfilgrastim (NEULASTA ONPRO KIT) injection 6 mg  6 mg Subcutaneous Once SPatrici Ranks MD      . sodium chloride 0.9 % injection 10 mL  10 mL Intracatheter PRN SPatrici Ranks MD   10 mL at 11/01/14 07425   Past Surgical History  Procedure Laterality Date  . Mastectomy Left 09/1980  . Tubal ligation  April 1982  . Dilatation & currettage/hysteroscopy with resectocope N/A 11/18/2012    Procedure: DILATATION & CURETTAGE/HYSTEROSCOPY WITH RESECTOCOPE;  Surgeon: SMarvene Staff MD;  [facility];  Service: Gynecology;  Laterality: N/A;  . Breast biopsy Left 09/11/80  .  Vaginal hysterectomy  12/09/2012    Complete hysterectomy -[facility]   Denies any headaches, dizziness, double vision, fevers, chills, night sweats, nausea, vomiting, diarrhea, constipation, chest pain, heart palpitations, shortness of breath, blood in stool, black tarry stool, urinary pain, urinary burning, urinary frequency, hematuria.   PHYSICAL EXAMINATION  ECOG PERFORMANCE STATUS: 1 - Symptomatic but completely ambulatory  There were no vitals filed for this visit.  GENERAL:alert, no distress, comfortable, cooperative, smiling and in chemo-bed with daughter-in-law at bedside. SKIN: skin color, texture, turgor are normal, no rashes or significant lesions HEAD: Normocephalic, No masses, lesions, tenderness or abnormalities, chemotherapy-induced alopecia EYES: normal, Conjunctiva are pink and non-injected EARS: External  ears normal OROPHARYNX:lips, buccal mucosa, and tongue normal and mucous membranes are moist  NECK: supple, no adenopathy, thyroid normal size, non-tender, without nodularity, trachea midline LYMPH:  no palpable lymphadenopathy BREAST:not examined LUNGS: clear to auscultation and percussion HEART: regular rate & rhythm and no murmurs ABDOMEN:abdomen soft, non-tender and normal bowel sounds BACK: Back symmetric, no curvature. EXTREMITIES:less then 2 second capillary refill, no joint deformities, effusion, or inflammation, no skin discoloration, no cyanosis  NEURO: alert & oriented x 3 with fluent speech, no focal motor/sensory deficits   LABORATORY DATA: CBC    Component Value Date/Time   WBC 7.4 10/31/2014 0953   RBC 2.99* 10/31/2014 0953   HGB 10.5* 10/31/2014 0953   HCT 31.3* 10/31/2014 0953   PLT 232 10/31/2014 0953   MCV 104.7* 10/31/2014 0953   MCH 35.1* 10/31/2014 0953   MCHC 33.5 10/31/2014 0953   RDW 18.0* 10/31/2014 0953   LYMPHSABS 0.7 10/31/2014 0953   MONOABS 0.5 10/31/2014 0953   EOSABS 0.1 10/31/2014 0953   BASOSABS 0.0 10/31/2014 0953        Chemistry      Component Value Date/Time   NA 137 10/31/2014 0938   K 4.0 10/31/2014 0938   CL 100* 10/31/2014 0938   CO2 30 10/31/2014 0938   BUN 17 10/31/2014 0938   CREATININE 0.53 10/31/2014 0938      Component Value Date/Time   CALCIUM 9.2 10/31/2014 0938   ALKPHOS 66 10/31/2014 0938   AST 21 10/31/2014 0938   ALT 17 10/31/2014 0938   BILITOT 0.6 10/31/2014 0938     Lab Results  Component Value Date   CA125 20.1 10/31/2014      PENDING LABS:   RADIOGRAPHIC STUDIES:  Nm Pet Image Restag (ps) Skull Base To Thigh  10/10/2014  CLINICAL DATA:  Subsequent encounter for treatment strategy for endometrial cancer with pulmonary nodules. EXAM: NUCLEAR MEDICINE PET SKULL BASE TO THIGH TECHNIQUE: 12.8 mCi F-18 FDG was injected intravenously. Full-ring PET imaging was performed from the skull base to thigh after the radiotracer. CT data was obtained and used for attenuation correction and anatomic localization. FASTING BLOOD GLUCOSE:  Value: 67 mg/dl COMPARISON:  07/13/2014. FINDINGS: NECK No hypermetabolic lymph nodes in the neck. CHEST No hypermetabolic mediastinal or hilar nodes. As before, the patient has bilateral hypermetabolic pulmonary nodules, but these have decreased in size and hypermetabolism in the interval. The dominant right lower lobe pulmonary nodule which was 2.9 x 3.7 cm on the previous study now measures 1.2 x 1.5 cm. On the previous exam, SUV max = 12.5 for this lesion compared to 1.8 today. 1.6 cm right upper lobe nodule on the previous study now measures 0.5 cm. SUV max = 1.4 for this nodule today compared to 13.2 previously. No new or progressive pulmonary nodule is evident. Right-sided Port-A-Cath tip is positioned in the upper right atrium. ABDOMEN/PELVIS No abnormal hypermetabolic activity within the liver, pancreas, adrenal glands, or spleen. No hypermetabolic lymph nodes in the abdomen or pelvis. Stable 2.5 cm lesion in the inferior right liver, non  hypermetabolic There is abdominal aortic atherosclerosis without aneurysm. SKELETON No focal areas of hypermetabolism within the bony anatomy. The patient does demonstrate diffusely increased FDG uptake within the marrow spaces, likely secondary to stimulatory effects of therapy. IMPRESSION: 1. Interval response to therapy with decrease in size and hypermetabolism of the patient's multiple pulmonary nodules. No new or progressive pulmonary lesions. 2. Diffuse marrow uptake, presumably secondary to therapy. 3. Abdominal aortic atherosclerosis. Electronically Signed     By: Eric  Mansell M.D.   On: 10/10/2014 15:10     PATHOLOGY:    ASSESSMENT AND PLAN:  Endometrial ca Stage IV metastatic endometrial cancer, biopsy proven on pulmonary nodule biopsy on 07/24/2014 showing ER+ and PR+ disease with a history of endometrial carcinoma, endometrioid adenocarcinoma with squamous differentiation, Stage IA Grade 3. 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   Now on systemic chemotherapy consisting of Carboplatin/Paclitaxel beginning on 08/09/2014 with PET imaging in Oct 2016 demonstrating a positive response to therapy.  Oncology history updated.  She started systemic chemotherapy with Carboplatin/Paclitaxel on 08/09/2014.  She continues to tolerate well and response thus far has been excellent.  Labs in 3 weeks, day prior to treatment: CBC diff, CMET, CA 125.  PET 2 weeks following cycle #6 of treatment which will be ~ Dec 1.  Order is placed for this to be completed at  Hospital, patient's preference.  Return in 3 weeks for follow-up with treatment.  Cycle #6 built.  Message sent to Angie Denny to verify approval with insurance.  She will then return following PET imaging.    THERAPY PLAN:  Continue with treatment as planned.  All questions were  answered. The patient knows to call the clinic with any problems, questions or concerns. We can certainly see the patient much sooner if necessary.  Patient and plan discussed with Dr. Shannon [name] and she is in agreement with the aforementioned.   This note is electronically signed by: ,, PA-C 11/01/2014 10:13 AM   

## 2014-10-30 NOTE — Assessment & Plan Note (Addendum)
Stage IV metastatic endometrial cancer, biopsy proven on pulmonary nodule biopsy on 07/24/2014 showing ER+ and PR+ disease with a history of endometrial carcinoma, endometrioid adenocarcinoma with squamous differentiation, Stage IA Grade 3. 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   Now on systemic chemotherapy consisting of Carboplatin/Paclitaxel beginning on 08/09/2014 with PET imaging in Oct 2016 demonstrating a positive response to therapy.  Oncology history updated.  She started systemic chemotherapy with Carboplatin/Paclitaxel on 08/09/2014.  She continues to tolerate well and response thus far has been excellent.  Labs in 3 weeks, day prior to treatment: CBC diff, CMET, CA 125.  PET 2 weeks following cycle #6 of treatment which will be ~ Dec 1.  Order is placed for this to be completed at [facility], [facility]'s preference.  Return in 3 weeks for follow-up with treatment.  Cycle #6 built.  Message sent to Durene Cal to verify approval with insurance.  She will then return following PET imaging.

## 2014-10-31 ENCOUNTER — Encounter (HOSPITAL_BASED_OUTPATIENT_CLINIC_OR_DEPARTMENT_OTHER): Payer: Medicare Other

## 2014-10-31 DIAGNOSIS — C7800 Secondary malignant neoplasm of unspecified lung: Secondary | ICD-10-CM

## 2014-10-31 DIAGNOSIS — R918 Other nonspecific abnormal finding of lung field: Secondary | ICD-10-CM

## 2014-10-31 DIAGNOSIS — C541 Malignant neoplasm of endometrium: Secondary | ICD-10-CM | POA: Diagnosis not present

## 2014-10-31 LAB — COMPREHENSIVE METABOLIC PANEL
ALK PHOS: 66 U/L (ref 38–126)
ALT: 17 U/L (ref 14–54)
AST: 21 U/L (ref 15–41)
Albumin: 3.7 g/dL (ref 3.5–5.0)
Anion gap: 7 (ref 5–15)
BUN: 17 mg/dL (ref 6–20)
CALCIUM: 9.2 mg/dL (ref 8.9–10.3)
CHLORIDE: 100 mmol/L — AB (ref 101–111)
CO2: 30 mmol/L (ref 22–32)
CREATININE: 0.53 mg/dL (ref 0.44–1.00)
Glucose, Bld: 87 mg/dL (ref 65–99)
Potassium: 4 mmol/L (ref 3.5–5.1)
Sodium: 137 mmol/L (ref 135–145)
Total Bilirubin: 0.6 mg/dL (ref 0.3–1.2)
Total Protein: 6.3 g/dL — ABNORMAL LOW (ref 6.5–8.1)

## 2014-10-31 LAB — CBC WITH DIFFERENTIAL/PLATELET
BASOS ABS: 0 10*3/uL (ref 0.0–0.1)
BASOS PCT: 1 %
EOS ABS: 0.1 10*3/uL (ref 0.0–0.7)
Eosinophils Relative: 1 %
HCT: 31.3 % — ABNORMAL LOW (ref 36.0–46.0)
HEMOGLOBIN: 10.5 g/dL — AB (ref 12.0–15.0)
Lymphocytes Relative: 10 %
Lymphs Abs: 0.7 10*3/uL (ref 0.7–4.0)
MCH: 35.1 pg — ABNORMAL HIGH (ref 26.0–34.0)
MCHC: 33.5 g/dL (ref 30.0–36.0)
MCV: 104.7 fL — ABNORMAL HIGH (ref 78.0–100.0)
Monocytes Absolute: 0.5 10*3/uL (ref 0.1–1.0)
Monocytes Relative: 6 %
NEUTROS PCT: 82 %
Neutro Abs: 6.1 10*3/uL (ref 1.7–7.7)
Platelets: 232 10*3/uL (ref 150–400)
RBC: 2.99 MIL/uL — AB (ref 3.87–5.11)
RDW: 18 % — ABNORMAL HIGH (ref 11.5–15.5)
WBC: 7.4 10*3/uL (ref 4.0–10.5)

## 2014-10-31 NOTE — Progress Notes (Signed)
Alejandra Callahan's reason for visit today is for labs as scheduled per MD orders.  Venipuncture performed with a 23 gauge butterfly needle to R Antecubital.  Alejandra Callahan tolerated procedure well and without incident; questions were answered and patient was discharged.    Notified pt she would good for treatment tomorrow

## 2014-11-01 ENCOUNTER — Encounter (HOSPITAL_BASED_OUTPATIENT_CLINIC_OR_DEPARTMENT_OTHER): Payer: Medicare Other

## 2014-11-01 ENCOUNTER — Encounter (HOSPITAL_BASED_OUTPATIENT_CLINIC_OR_DEPARTMENT_OTHER): Payer: Medicare Other | Admitting: Oncology

## 2014-11-01 VITALS — BP 100/57 | HR 87 | Temp 97.7°F | Resp 16 | Wt 113.8 lb

## 2014-11-01 DIAGNOSIS — C541 Malignant neoplasm of endometrium: Secondary | ICD-10-CM

## 2014-11-01 DIAGNOSIS — C7800 Secondary malignant neoplasm of unspecified lung: Secondary | ICD-10-CM

## 2014-11-01 DIAGNOSIS — Z5111 Encounter for antineoplastic chemotherapy: Secondary | ICD-10-CM | POA: Diagnosis not present

## 2014-11-01 DIAGNOSIS — Z5189 Encounter for other specified aftercare: Secondary | ICD-10-CM

## 2014-11-01 LAB — CA 125: CA 125: 20.1 U/mL (ref 0.0–38.1)

## 2014-11-01 MED ORDER — HEPARIN SOD (PORK) LOCK FLUSH 100 UNIT/ML IV SOLN
500.0000 [IU] | Freq: Once | INTRAVENOUS | Status: AC | PRN
  Administered 2014-11-01: 500 [IU]

## 2014-11-01 MED ORDER — PEGFILGRASTIM 6 MG/0.6ML ~~LOC~~ PSKT
6.0000 mg | PREFILLED_SYRINGE | Freq: Once | SUBCUTANEOUS | Status: AC
  Administered 2014-11-01: 6 mg via SUBCUTANEOUS
  Filled 2014-11-01: qty 0.6

## 2014-11-01 MED ORDER — DIPHENHYDRAMINE HCL 50 MG/ML IJ SOLN
25.0000 mg | Freq: Once | INTRAMUSCULAR | Status: AC
  Administered 2014-11-01: 25 mg via INTRAVENOUS

## 2014-11-01 MED ORDER — FAMOTIDINE IN NACL 20-0.9 MG/50ML-% IV SOLN
20.0000 mg | Freq: Once | INTRAVENOUS | Status: AC
  Administered 2014-11-01: 20 mg via INTRAVENOUS

## 2014-11-01 MED ORDER — FAMOTIDINE IN NACL 20-0.9 MG/50ML-% IV SOLN
INTRAVENOUS | Status: AC
  Filled 2014-11-01: qty 50

## 2014-11-01 MED ORDER — PACLITAXEL CHEMO INJECTION 300 MG/50ML
175.0000 mg/m2 | Freq: Once | INTRAVENOUS | Status: AC
  Administered 2014-11-01: 258 mg via INTRAVENOUS
  Filled 2014-11-01: qty 43

## 2014-11-01 MED ORDER — SODIUM CHLORIDE 0.9 % IV SOLN
344.5000 mg | Freq: Once | INTRAVENOUS | Status: AC
  Administered 2014-11-01: 340 mg via INTRAVENOUS
  Filled 2014-11-01: qty 34

## 2014-11-01 MED ORDER — DEXAMETHASONE SODIUM PHOSPHATE 100 MG/10ML IJ SOLN
Freq: Once | INTRAMUSCULAR | Status: AC
  Administered 2014-11-01: 09:00:00 via INTRAVENOUS
  Filled 2014-11-01: qty 8

## 2014-11-01 MED ORDER — SODIUM CHLORIDE 0.9 % IJ SOLN
10.0000 mL | INTRAMUSCULAR | Status: DC | PRN
  Administered 2014-11-01: 10 mL
  Filled 2014-11-01: qty 10

## 2014-11-01 MED ORDER — HEPARIN SOD (PORK) LOCK FLUSH 100 UNIT/ML IV SOLN
INTRAVENOUS | Status: AC
  Filled 2014-11-01: qty 5

## 2014-11-01 MED ORDER — DIPHENHYDRAMINE HCL 50 MG/ML IJ SOLN
INTRAMUSCULAR | Status: AC
  Filled 2014-11-01: qty 1

## 2014-11-01 MED ORDER — SODIUM CHLORIDE 0.9 % IV SOLN
Freq: Once | INTRAVENOUS | Status: AC
  Administered 2014-11-01: 09:00:00 via INTRAVENOUS

## 2014-11-01 NOTE — Patient Instructions (Signed)
[facility] Discharge Instructions for Patients Receiving Chemotherapy  Today you received the following chemotherapy agents Taxol and Carbo. Neulasta OnPro Body injector placed on upper right arm. Injector will deliver medication between 430 pm and 530 pm tomorrow (Friday). You may safely remove injector after 530 pm tomorrow and when indicator window reads EMPTY.  To help prevent nausea and vomiting after your treatment, we encourage you to take your nausea medication as instructed. If you develop nausea and vomiting that is not controlled by your nausea medication, call the clinic. If it is after clinic hours your family physician or the after hours number for the clinic or go to the Emergency Department. BELOW ARE SYMPTOMS THAT SHOULD BE REPORTED IMMEDIATELY:  *FEVER GREATER THAN 101.0 F  *CHILLS WITH OR WITHOUT FEVER  NAUSEA AND VOMITING THAT IS NOT CONTROLLED WITH YOUR NAUSEA MEDICATION  *UNUSUAL SHORTNESS OF BREATH  *UNUSUAL BRUISING OR BLEEDING  TENDERNESS IN MOUTH AND THROAT WITH OR WITHOUT PRESENCE OF ULCERS  *URINARY PROBLEMS  *BOWEL PROBLEMS  UNUSUAL RASH Items with * indicate a potential emergency and should be followed up as soon as possible.  return as scheduled.  I have been informed and understand all the instructions given to me. I know to contact the clinic, my physician, or go to the Emergency Department if any problems should occur. I do not have any questions at this time, but understand that I may call the clinic during office hours or the Patient Navigator at [phone] should I have any questions or need assistance in obtaining follow up care.    __________________________________________  _____________  __________ Signature of Patient or Authorized Representative            Date                   Time    __________________________________________ Nurse's Signature

## 2014-11-01 NOTE — Progress Notes (Signed)
Tolerated chemo well. Alejandra Callahan KitchenBenjamine Callahan arrived today for [facility] neulasta on body injector. See MAR for administration details. Injector in place and engaged with green light indicator on flashing. Tolerated application with out problems.

## 2014-11-01 NOTE — Patient Instructions (Signed)
[place] [facility] Discharge Instructions  RECOMMENDATIONS MADE BY THE CONSULTANT AND ANY TEST RESULTS WILL BE SENT TO YOUR REFERRING PHYSICIAN.  Exam and discussion by Robynn Pane, PA-C Report any new lumps, bone pain, shortness of breath, fevers, uncontrolled nausea, vomiting or other concerns.  Follow-up in 3 weeks with office visit and chemo PET scan in December and office visit after scan.  Thank you for choosing [place] at [facility] to provide your oncology and hematology care.  To afford each patient quality time with our provider, please arrive at least 15 minutes before your scheduled appointment time.    You need to re-schedule your appointment should you arrive 10 or more minutes late.  We strive to give you quality time with our providers, and arriving late affects you and other patients whose appointments are after yours.  Also, if you no show three or more times for appointments you may be dismissed from the clinic at the providers discretion.     Again, thank you for choosing [facility].  Our hope is that these requests will decrease the amount of time that you wait before being seen by our physicians.       _____________________________________________________________  Should you have questions after your visit to [facility], please contact our office at [phone] between the hours of 8:30 a.m. and 4:30 p.m.  Voicemails left after 4:30 p.m. will not be returned until the following business day.  For prescription refill requests, have your pharmacy contact our office.

## 2014-11-20 NOTE — Assessment & Plan Note (Addendum)
Stage IV metastatic endometrial cancer, biopsy proven on pulmonary nodule biopsy on 07/24/2014 showing ER+ and PR+ disease with a history of endometrial carcinoma, endometrioid adenocarcinoma with squamous differentiation, Stage IA Grade 3. 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   Now on systemic chemotherapy consisting of Carboplatin/Paclitaxel beginning on 08/09/2014 with PET imaging in Oct 2016 demonstrating a positive response to therapy.  Oncology history is up-to-date.  She started systemic chemotherapy with Carboplatin/Paclitaxel on 08/09/2014.  She continues to tolerate well and response thus far has been excellent.  Cycle #6 today. PET scan is scheduled for 12/06/2014 at [facility].    She will then return following PET imaging as scheduled for follow-up on 12/11/2014 with Dr. Whitney Muse to review results of PET and to discuss future treatment options.  She notes some vaginal discharge that is white/yellow in color x 1 month.  She denies any other symptoms.  Rx for Diflucan e-scribed.  Additionally, she notes hyperpigmentation B/L of labia majora, again asymptomatic.  She can follow-up with Gyn if Diflucan fails to help with her discharge.

## 2014-11-20 NOTE — Progress Notes (Signed)
Alejandra Neighbors, MD [place] [address]  Endometrial ca [facility])  Vaginal discharge - Plan: fluconazole (DIFLUCAN) 100 MG tablet  CURRENT THERAPY: Carboplatin/Paclitaxel  INTERVAL HISTORY: Alejandra Callahan 69 y.o. female returns for followup of Stage IV endometrial cancer.    Endometrial ca ([place])   11/28/2012 Initial Diagnosis Endometrial ca, Embx Grade 2   12/09/2012 Surgery IAG3, close follow up   07/05/2014 Imaging Chest Xray- multiple new pulmonary nodules consistent with pulmonary metastases.   07/13/2014 PET scan Multiple hypermetabolic pulmonary nodules and masses of varying size in the lungs bilaterally. Imaging features are compatible with metastatic disease.   07/24/2014 Pathology Results Lung, needle/core biopsy(ies), right - ADENOCARCINOMA.   08/06/2014 Procedure Port placed by IR   08/09/2014 -  Chemotherapy Carboplatin/Paclitaxel   10/10/2014 PET scan Interval response to therapy with decrease in size and hypermetabolism of the patient's multiple pulmonary nodules. No new or progressive pulmonary lesions.   I personally reviewed and went over laboratory results with the patient.  The results are noted within this dictation.  She notes a white/yellow vaginal discharge and labial hyperpigmentation bilaterally. Discharge has been ongoing x 1 month.  She denies any burning, pain, or tenderness.  She denies any urinary complaints.  She denies tenderness to palpation.  She denies any nausea or vomiting.  She denies any complaints today.    Past Medical History  Diagnosis Date  . Cancer (HCC)     breast  . Arthritis     Rheumatoid arthritis  . PONV (postoperative nausea and vomiting) 1982  . Breast cancer ([place]) 09/1980    left unilateral mastectomy, no chemo or radiatioin  . Endometrial cancer ([place]) 2014    has Endometrial ca [facility]); BRCA negative; and Malignant neoplasm metastatic to lung [facility]) on her problem list.     is allergic to codeine; acyclovir  and related; other; tuberculin ppd; and tylox.  Current Outpatient Prescriptions on File Prior to Visit  Medication Sig Dispense Refill  . alendronate (FOSAMAX) 10 MG tablet Take 70 mg by mouth once a week. Monday    . Calcium-Vitamin D (CALTRATE 600 PLUS-VIT D PO) Take 1 each by mouth 2 (two) times daily.     Marland Kitchen CARBOPLATIN IV Inject into the vein every 21 ( twenty-one) days. To start 08/08/14    . cholecalciferol (VITAMIN D) 1000 UNITS tablet Take 1,000 Units by mouth daily.    Marland Kitchen dexamethasone (DECADRON) 4 MG tablet Day before chemo: take 5 tablets (71m) in the am and 5 tablets (235m in the pm. Morning of chemo take 5 tablets (203m 45 tablet 1  . folic acid (FOLVITE) 800720G tablet Take 800 mcg by mouth daily.     . hydroxychloroquine (PLAQUENIL) 200 MG tablet Take by mouth daily.    . lMarland Kitchendocaine-prilocaine (EMLA) cream Apply a quarter size amount to port site 1 hour prior to chemo. Do not rub in. Cover with plastic wrap. 30 g 3  . Melatonin 3 MG CAPS Take 3 mg by mouth at bedtime as needed.    . methotrexate (RHEUMATREX) 2.5 MG tablet Take 2.5 mg by mouth 2 (two) times a week. Caution:Chemotherapy. Protect from light.  Pt to take 4 tablets on Friday and 4 tablets on Saturday    . Multiple Vitamin (MULTIVITAMIN) tablet Take 1 tablet by mouth daily.    . PMarland KitchenCLitaxel (TAXOL) 300 MG/50ML injection Inject into the vein every 21 ( twenty-one) days. To start 08/08/14    .  Pegfilgrastim (NEULASTA ONPRO [place]) Inject into the skin every 21 ( twenty-one) days. To be given 27 hours after chemo (starting 08/09/14)    . predniSONE (DELTASONE) 5 MG tablet Take 5 mg by mouth daily as needed (flare up).     . raloxifene (EVISTA) 60 MG tablet Take 60 mg by mouth daily.    Marland Kitchen zolpidem (AMBIEN) 10 MG tablet Take 1/2 tab to 1 tab at bedtime as needed for trouble sleeping. 30 tablet 0  . acetaminophen (TYLENOL) 500 MG tablet Take 500 mg by mouth every 6 (six) hours as needed for moderate pain.     Marland Kitchen Alum & Mag  Hydroxide-Simeth (MAGIC MOUTHWASH W/LIDOCAINE) SOLN Take 5 mLs by mouth 3 (three) times daily as needed for mouth pain (use as needed for mouth pain). (Patient not taking: Reported on 09/20/2014) 200 mL 3  . ondansetron (ZOFRAN) 8 MG tablet Take 1 tablet (8 mg total) by mouth every 8 (eight) hours as needed for nausea or vomiting. (Patient not taking: Reported on 10/11/2014) 30 tablet 2  . prochlorperazine (COMPAZINE) 10 MG tablet Take 1 tablet (10 mg total) by mouth every 6 (six) hours as needed (Nausea or vomiting). (Patient not taking: Reported on 11/22/2014) 30 tablet 2   Current Facility-Administered Medications on File Prior to Visit  Medication Dose Route Frequency Provider Last Rate Last Dose  . CARBOplatin (PARAPLATIN) 340 mg in sodium chloride 0.9 % 100 mL chemo infusion  340 mg Intravenous Once Patrici Ranks, MD      . famotidine (PEPCID) IVPB 20 mg premix  20 mg Intravenous Once Patrici Ranks, MD   20 mg at 11/22/14 0947  . heparin lock flush 100 unit/mL  500 Units Intracatheter Once PRN Patrici Ranks, MD      . PACLitaxel (TAXOL) 258 mg in dextrose 5 % 250 mL chemo infusion (> 47m/m2)  175 mg/m2 (Treatment Plan Actual) Intravenous Once SPatrici Ranks MD      . sodium chloride 0.9 % injection 10 mL  10 mL Intracatheter PRN SPatrici Ranks MD        Past Surgical History  Procedure Laterality Date  . Mastectomy Left 09/1980  . Tubal ligation  April 1982  . Dilatation & currettage/hysteroscopy with resectocope N/A 11/18/2012    Procedure: DILATATION & CURETTAGE/HYSTEROSCOPY WITH RESECTOCOPE;  Surgeon: SMarvene Staff MD;  [facility];  Service: Gynecology;  Laterality: N/A;  . Breast biopsy Left 09/11/80  . Vaginal hysterectomy  12/09/2012    Complete hysterectomy - [facility]     Denies any headaches, dizziness, double vision, fevers, chills, night sweats, nausea, vomiting, diarrhea, constipation, chest pain, heart  palpitations, shortness of breath, blood in stool, black tarry stool, urinary pain, urinary burning, urinary frequency, hematuria.   PHYSICAL EXAMINATION  ECOG PERFORMANCE STATUS: 1 - Symptomatic but completely ambulatory  Filed Vitals:   11/22/14 0900  BP: 124/52  Pulse: 103  Temp: 97.7 F (36.5 C)  Resp: 18    GENERAL:alert, no distress, comfortable, cooperative, smiling and in chemo-bed with daughter-in-law at bedside. SKIN: skin color, texture, turgor are normal, no rashes or significant lesions HEAD: Normocephalic, No masses, lesions, tenderness or abnormalities, chemotherapy-induced alopecia EYES: normal, Conjunctiva are pink and non-injected EARS: External ears normal OROPHARYNX:lips, buccal mucosa, and tongue normal and mucous membranes are moist  NECK: supple, no adenopathy, thyroid normal size, non-tender, without nodularity, trachea midline LYMPH:  no palpable lymphadenopathy BREAST:not examined LUNGS: clear to auscultation  and percussion HEART: regular rate & rhythm and no murmurs ABDOMEN:abdomen soft, non-tender and normal bowel sounds BACK: Back symmetric, no curvature. EXTREMITIES:less then 2 second capillary refill, no joint deformities, effusion, or inflammation, no skin discoloration, no cyanosis  NEURO: alert & oriented x 3 with fluent speech, no focal motor/sensory deficits GENITALIA: B/L labia majora hyperpigmentation without any lesions, erythema, edema, tenderness.  White/yellow discharge noted.  Not malodorous.    LABORATORY DATA: CBC    Component Value Date/Time   WBC 4.5 11/21/2014 0917   RBC 2.72* 11/21/2014 0917   HGB 9.7* 11/21/2014 0917   HCT 29.6* 11/21/2014 0917   PLT 237 11/21/2014 0917   MCV 108.8* 11/21/2014 0917   MCH 35.7* 11/21/2014 0917   MCHC 32.8 11/21/2014 0917   RDW 16.8* 11/21/2014 0917   LYMPHSABS 1.0 11/21/2014 0917   MONOABS 0.7 11/21/2014 0917   EOSABS 0.1 11/21/2014 0917   BASOSABS 0.1 11/21/2014 0917      Chemistry       Component Value Date/Time   NA 139 11/21/2014 0917   K 3.9 11/21/2014 0917   CL 104 11/21/2014 0917   CO2 29 11/21/2014 0917   BUN 16 11/21/2014 0917   CREATININE 0.47 11/21/2014 0917      Component Value Date/Time   CALCIUM 9.1 11/21/2014 0917   ALKPHOS 61 11/21/2014 0917   AST 23 11/21/2014 0917   ALT 16 11/21/2014 0917   BILITOT 0.2* 11/21/2014 0917     Lab Results  Component Value Date   CA125 18.9 11/21/2014      PENDING LABS:   RADIOGRAPHIC STUDIES:  No results found.   PATHOLOGY:    ASSESSMENT AND PLAN:  Endometrial ca Stage IV metastatic endometrial cancer, biopsy proven on pulmonary nodule biopsy on 07/24/2014 showing ER+ and PR+ disease with a history of endometrial carcinoma, endometrioid adenocarcinoma with squamous differentiation, Stage IA Grade 3. 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   Now on systemic chemotherapy consisting of Carboplatin/Paclitaxel beginning on 08/09/2014 with PET imaging in Oct 2016 demonstrating a positive response to therapy.  Oncology history is up-to-date.  She started systemic chemotherapy with Carboplatin/Paclitaxel on 08/09/2014.  She continues to tolerate well and response thus far has been excellent.  Cycle #6 today. PET scan is scheduled for 12/06/2014 at [facility].    She will then return following PET imaging as scheduled for follow-up on 12/11/2014 with Dr. Whitney Muse to review results of PET and to discuss future treatment options.  She notes some vaginal discharge that is white/yellow in color x 1 month.  She denies any other symptoms.  Rx for Diflucan e-scribed.  Additionally, she notes hyperpigmentation B/L of labia majora, again asymptomatic.  She can follow-up with Gyn if Diflucan fails to help with her discharge.    THERAPY PLAN:  Continue with treatment as planned.  PET  is scheduled for 12/06/2014 to evaluate response to therapy.    All questions were answered. The patient knows to call the clinic with any problems, questions or concerns. We can certainly see the patient much sooner if necessary.  Patient and plan discussed with Dr. Ancil Linsey and she is in agreement with the aforementioned.   This note is electronically signed by: Doy Mince 11/22/2014 9:52 AM

## 2014-11-21 ENCOUNTER — Encounter (HOSPITAL_COMMUNITY): Payer: Medicare Other | Attending: Hematology & Oncology

## 2014-11-21 ENCOUNTER — Other Ambulatory Visit (HOSPITAL_COMMUNITY): Payer: Medicare Other

## 2014-11-21 DIAGNOSIS — Z79899 Other long term (current) drug therapy: Secondary | ICD-10-CM | POA: Diagnosis not present

## 2014-11-21 DIAGNOSIS — R918 Other nonspecific abnormal finding of lung field: Secondary | ICD-10-CM | POA: Diagnosis present

## 2014-11-21 DIAGNOSIS — C541 Malignant neoplasm of endometrium: Secondary | ICD-10-CM | POA: Diagnosis not present

## 2014-11-21 DIAGNOSIS — M069 Rheumatoid arthritis, unspecified: Secondary | ICD-10-CM | POA: Insufficient documentation

## 2014-11-21 DIAGNOSIS — Z853 Personal history of malignant neoplasm of breast: Secondary | ICD-10-CM | POA: Insufficient documentation

## 2014-11-21 LAB — CBC WITH DIFFERENTIAL/PLATELET
Basophils Absolute: 0.1 10*3/uL (ref 0.0–0.1)
Basophils Relative: 1 %
Eosinophils Absolute: 0.1 10*3/uL (ref 0.0–0.7)
Eosinophils Relative: 2 %
HEMATOCRIT: 29.6 % — AB (ref 36.0–46.0)
HEMOGLOBIN: 9.7 g/dL — AB (ref 12.0–15.0)
LYMPHS ABS: 1 10*3/uL (ref 0.7–4.0)
LYMPHS PCT: 22 %
MCH: 35.7 pg — AB (ref 26.0–34.0)
MCHC: 32.8 g/dL (ref 30.0–36.0)
MCV: 108.8 fL — AB (ref 78.0–100.0)
MONO ABS: 0.7 10*3/uL (ref 0.1–1.0)
MONOS PCT: 15 %
NEUTROS ABS: 2.7 10*3/uL (ref 1.7–7.7)
NEUTROS PCT: 60 %
Platelets: 237 10*3/uL (ref 150–400)
RBC: 2.72 MIL/uL — ABNORMAL LOW (ref 3.87–5.11)
RDW: 16.8 % — ABNORMAL HIGH (ref 11.5–15.5)
WBC: 4.5 10*3/uL (ref 4.0–10.5)

## 2014-11-21 LAB — COMPREHENSIVE METABOLIC PANEL
ALBUMIN: 3.5 g/dL (ref 3.5–5.0)
ALK PHOS: 61 U/L (ref 38–126)
ALT: 16 U/L (ref 14–54)
ANION GAP: 6 (ref 5–15)
AST: 23 U/L (ref 15–41)
BILIRUBIN TOTAL: 0.2 mg/dL — AB (ref 0.3–1.2)
BUN: 16 mg/dL (ref 6–20)
CALCIUM: 9.1 mg/dL (ref 8.9–10.3)
CO2: 29 mmol/L (ref 22–32)
Chloride: 104 mmol/L (ref 101–111)
Creatinine, Ser: 0.47 mg/dL (ref 0.44–1.00)
GFR calc Af Amer: >60 mL/min (ref >60)
GLUCOSE: 84 mg/dL (ref 65–99)
POTASSIUM: 3.9 mmol/L (ref 3.5–5.1)
Sodium: 139 mmol/L (ref 135–145)
TOTAL PROTEIN: 6 g/dL — AB (ref 6.5–8.1)

## 2014-11-22 ENCOUNTER — Encounter (HOSPITAL_COMMUNITY): Payer: Self-pay | Admitting: Oncology

## 2014-11-22 ENCOUNTER — Encounter (HOSPITAL_BASED_OUTPATIENT_CLINIC_OR_DEPARTMENT_OTHER): Payer: Medicare Other | Admitting: Oncology

## 2014-11-22 ENCOUNTER — Encounter (HOSPITAL_BASED_OUTPATIENT_CLINIC_OR_DEPARTMENT_OTHER): Payer: Medicare Other

## 2014-11-22 VITALS — BP 106/51 | HR 87 | Temp 97.8°F | Resp 16

## 2014-11-22 VITALS — BP 124/52 | HR 103 | Temp 97.7°F | Resp 18 | Wt 114.2 lb

## 2014-11-22 DIAGNOSIS — C541 Malignant neoplasm of endometrium: Secondary | ICD-10-CM

## 2014-11-22 DIAGNOSIS — Z5111 Encounter for antineoplastic chemotherapy: Secondary | ICD-10-CM

## 2014-11-22 DIAGNOSIS — N898 Other specified noninflammatory disorders of vagina: Secondary | ICD-10-CM | POA: Diagnosis not present

## 2014-11-22 DIAGNOSIS — Z5189 Encounter for other specified aftercare: Secondary | ICD-10-CM | POA: Diagnosis not present

## 2014-11-22 LAB — CA 125: CA 125: 18.9 U/mL (ref 0.0–38.1)

## 2014-11-22 MED ORDER — PACLITAXEL CHEMO INJECTION 300 MG/50ML
175.0000 mg/m2 | Freq: Once | INTRAVENOUS | Status: AC
  Administered 2014-11-22: 258 mg via INTRAVENOUS
  Filled 2014-11-22: qty 43

## 2014-11-22 MED ORDER — SODIUM CHLORIDE 0.9 % IV SOLN
Freq: Once | INTRAVENOUS | Status: AC
  Administered 2014-11-22: 10:00:00 via INTRAVENOUS
  Filled 2014-11-22: qty 8

## 2014-11-22 MED ORDER — PEGFILGRASTIM 6 MG/0.6ML ~~LOC~~ PSKT
6.0000 mg | PREFILLED_SYRINGE | Freq: Once | SUBCUTANEOUS | Status: AC
  Administered 2014-11-22: 6 mg via SUBCUTANEOUS

## 2014-11-22 MED ORDER — FAMOTIDINE IN NACL 20-0.9 MG/50ML-% IV SOLN
20.0000 mg | Freq: Once | INTRAVENOUS | Status: AC
  Administered 2014-11-22: 20 mg via INTRAVENOUS
  Filled 2014-11-22: qty 50

## 2014-11-22 MED ORDER — SODIUM CHLORIDE 0.9 % IV SOLN
Freq: Once | INTRAVENOUS | Status: AC
Start: 2014-11-22 — End: 2014-11-22
  Administered 2014-11-22: 09:00:00 via INTRAVENOUS

## 2014-11-22 MED ORDER — FLUCONAZOLE 100 MG PO TABS: ORAL_TABLET | ORAL | Status: DC

## 2014-11-22 MED ORDER — PEGFILGRASTIM 6 MG/0.6ML ~~LOC~~ PSKT
PREFILLED_SYRINGE | SUBCUTANEOUS | Status: AC
Start: 2014-11-22 — End: 2014-11-22
  Filled 2014-11-22: qty 0.6

## 2014-11-22 MED ORDER — DIPHENHYDRAMINE HCL 50 MG/ML IJ SOLN
25.0000 mg | Freq: Once | INTRAMUSCULAR | Status: AC
  Administered 2014-11-22: 25 mg via INTRAVENOUS
  Filled 2014-11-22: qty 1

## 2014-11-22 MED ORDER — SODIUM CHLORIDE 0.9 % IJ SOLN
10.0000 mL | INTRAMUSCULAR | Status: DC | PRN
  Administered 2014-11-22: 10 mL
  Filled 2014-11-22: qty 10

## 2014-11-22 MED ORDER — SODIUM CHLORIDE 0.9 % IV SOLN
344.5000 mg | Freq: Once | INTRAVENOUS | Status: AC
  Administered 2014-11-22: 340 mg via INTRAVENOUS
  Filled 2014-11-22: qty 34

## 2014-11-22 MED ORDER — HEPARIN SOD (PORK) LOCK FLUSH 100 UNIT/ML IV SOLN
500.0000 [IU] | Freq: Once | INTRAVENOUS | Status: AC | PRN
  Administered 2014-11-22: 500 [IU]

## 2014-11-22 MED ORDER — HEPARIN SOD (PORK) LOCK FLUSH 100 UNIT/ML IV SOLN
INTRAVENOUS | Status: AC
  Filled 2014-11-22: qty 5

## 2014-11-22 NOTE — Progress Notes (Signed)
1400:  Tolerated tx w/o adverse reaction.  A&Ox4, in no distress.  VSS.  Neulasta OBI placed per MD orders. OBI device filled per protocol and placed on right upper arm.  Needle/catheter placement noted prior to patient leaving.  Injector in place and engaged with green light indicator on flashing. Tolerated without incident and aware of injection to be delivered in 27 hours.  Discharged ambulatory.

## 2014-11-22 NOTE — Progress Notes (Signed)
LABS DRAWN

## 2014-11-22 NOTE — Patient Instructions (Signed)
[place] at [facility] Discharge Instructions  RECOMMENDATIONS MADE BY THE CONSULTANT AND ANY TEST RESULTS WILL BE SENT TO YOUR REFERRING PHYSICIAN.  Exam and discussion by Robynn Pane, PA-C Will treat today Call with uncontrolled nausea, vomiting or other concerns. PET Scan on 12/1 as scheduled.  Too soon to check for authorization per Development worker, community.  Follow-up: as scheduled on 12/10/14.   Thank you for choosing [place] at [facility] to provide your oncology and hematology care.  To afford each patient quality time with our provider, please arrive at least 15 minutes before your scheduled appointment time.    You need to re-schedule your appointment should you arrive 10 or more minutes late.  We strive to give you quality time with our providers, and arriving late affects you and other patients whose appointments are after yours.  Also, if you no show three or more times for appointments you may be dismissed from the clinic at the providers discretion.     Again, thank you for choosing [facility].  Our hope is that these requests will decrease the amount of time that you wait before being seen by our physicians.       _____________________________________________________________  Should you have questions after your visit to [facility], please contact our office at [phone] between the hours of 8:30 a.m. and 4:30 p.m.  Voicemails left after 4:30 p.m. will not be returned until the following business day.  For prescription refill requests, have your pharmacy contact our office.

## 2014-11-22 NOTE — Patient Instructions (Signed)
[place] at [facility] Discharge Instructions  RECOMMENDATIONS MADE BY THE CONSULTANT AND ANY TEST RESULTS WILL BE SENT TO YOUR REFERRING PHYSICIAN.  Exam and discussion by Robynn Pane, PA-C Will treat today Call with uncontrolled nausea, vomiting or other concerns. PET Scan on 12/1 as scheduled.  Too soon to check for authorization per Development worker, community.  Follow-up: as scheduled on 12/10/14.   Thank you for choosing [place] at [facility] to provide your oncology and hematology care.  To afford each patient quality time with our provider, please arrive at least 15 minutes before your scheduled appointment time.    You need to re-schedule your appointment should you arrive 10 or more minutes late.  We strive to give you quality time with our providers, and arriving late affects you and other patients whose appointments are after yours.  Also, if you no show three or more times for appointments you may be dismissed from the clinic at the providers discretion.     Again, thank you for choosing [facility].  Our hope is that these requests will decrease the amount of time that you wait before being seen by our physicians.       _____________________________________________________________  Should you have questions after your visit to [facility], please contact our office at [phone] between the hours of 8:30 a.m. and 4:30 p.m.  Voicemails left after 4:30 p.m. will not be returned until the following business day.  For prescription refill requests, have your pharmacy contact our office.

## 2014-12-03 ENCOUNTER — Telehealth: Payer: Self-pay | Admitting: Gynecologic Oncology

## 2014-12-03 NOTE — Telephone Encounter (Signed)
Patient called stating she has had "creamy discharge since the beginning of October from my vagina."  She recently completed her 6th cycle of chemotherapy at [facility].  She states she had an examination on Nov 17 and was given diflucan.  She states she was told "if the discharge did not go away then there must be something more."  The discharge has continued and she also reports her vulva appearing gray in color, which was present during her examination on the 17th as well.  She is to have a PET scan on Thurs of this week and would like to see a physician after.  Appt made with Dr. Denman George.  No other concerns voiced.  Advised to call the office if symptoms worsen or for any questions or concerns.

## 2014-12-06 ENCOUNTER — Ambulatory Visit
Admission: RE | Admit: 2014-12-06 | Discharge: 2014-12-06 | Disposition: A | Discharge status: Home / Self Care | Payer: Medicare Other | Source: Ambulatory Visit | Attending: Oncology | Admitting: Oncology

## 2014-12-06 DIAGNOSIS — Z0189 Encounter for other specified special examinations: Secondary | ICD-10-CM | POA: Diagnosis present

## 2014-12-06 DIAGNOSIS — Z9221 Personal history of antineoplastic chemotherapy: Secondary | ICD-10-CM | POA: Diagnosis not present

## 2014-12-06 DIAGNOSIS — C7802 Secondary malignant neoplasm of left lung: Secondary | ICD-10-CM | POA: Insufficient documentation

## 2014-12-06 DIAGNOSIS — C7801 Secondary malignant neoplasm of right lung: Secondary | ICD-10-CM | POA: Insufficient documentation

## 2014-12-06 DIAGNOSIS — C541 Malignant neoplasm of endometrium: Secondary | ICD-10-CM | POA: Diagnosis not present

## 2014-12-06 LAB — GLUCOSE, CAPILLARY: Glucose-Capillary: 74 mg/dL (ref 65–99)

## 2014-12-06 MED ORDER — FLUDEOXYGLUCOSE F - 18 (FDG) INJECTION
12.8000 | Freq: Once | INTRAVENOUS | Status: AC | PRN
  Administered 2014-12-06: 12.8 via INTRAVENOUS

## 2014-12-10 ENCOUNTER — Encounter (HOSPITAL_COMMUNITY): Payer: Medicare Other | Attending: Hematology & Oncology | Admitting: Hematology & Oncology

## 2014-12-10 ENCOUNTER — Encounter (HOSPITAL_COMMUNITY): Payer: Self-pay | Admitting: Hematology & Oncology

## 2014-12-10 ENCOUNTER — Encounter: Payer: Self-pay | Admitting: Gynecologic Oncology

## 2014-12-10 ENCOUNTER — Ambulatory Visit: Payer: Medicare Other | Attending: Gynecologic Oncology | Admitting: Gynecologic Oncology

## 2014-12-10 VITALS — BP 105/48 | HR 93 | Temp 98.2°F | Resp 18 | Wt 114.4 lb

## 2014-12-10 VITALS — BP 103/47 | HR 90 | Temp 98.4°F | Resp 18 | Ht 59.0 in | Wt 114.4 lb

## 2014-12-10 DIAGNOSIS — Z853 Personal history of malignant neoplasm of breast: Secondary | ICD-10-CM | POA: Diagnosis not present

## 2014-12-10 DIAGNOSIS — Z79899 Other long term (current) drug therapy: Secondary | ICD-10-CM | POA: Insufficient documentation

## 2014-12-10 DIAGNOSIS — M069 Rheumatoid arthritis, unspecified: Secondary | ICD-10-CM | POA: Insufficient documentation

## 2014-12-10 DIAGNOSIS — J988 Other specified respiratory disorders: Secondary | ICD-10-CM | POA: Diagnosis not present

## 2014-12-10 DIAGNOSIS — C7802 Secondary malignant neoplasm of left lung: Secondary | ICD-10-CM | POA: Diagnosis not present

## 2014-12-10 DIAGNOSIS — C541 Malignant neoplasm of endometrium: Secondary | ICD-10-CM | POA: Diagnosis not present

## 2014-12-10 DIAGNOSIS — R918 Other nonspecific abnormal finding of lung field: Secondary | ICD-10-CM | POA: Insufficient documentation

## 2014-12-10 DIAGNOSIS — C7801 Secondary malignant neoplasm of right lung: Secondary | ICD-10-CM

## 2014-12-10 MED ORDER — LEVOFLOXACIN 500 MG PO TABS: 500.0000 mg | ORAL_TABLET | Freq: Every day | ORAL | Status: DC

## 2014-12-10 NOTE — Addendum Note (Signed)
Addended by: Elenor Legato on: 12/10/2014 01:50 PM   Modules accepted: Orders

## 2014-12-10 NOTE — Patient Instructions (Signed)
Plan to follow up with Dr. Whitney Muse as scheduled.  Please call for any questions or concerns.

## 2014-12-10 NOTE — Patient Instructions (Addendum)
[place] at [facility] Discharge Instructions  RECOMMENDATIONS MADE BY THE CONSULTANT AND ANY TEST RESULTS WILL BE SENT TO YOUR REFERRING PHYSICIAN.    Exam completed by Dr Whitney Muse today We are going to start you on a antibiotic, 10 days make sure you complete these If you have any problems with yeast after the antibiotic please call us If at the end of these antibiotics you are still having a cough please call us, we can get you referred to a pulmonaologist.  Your cancer marker is good it was 18, you had a small area on the left side in the base of your lung that we are going to watch on your PET scan. We will re-scan you get another PET scan in 6-8 weeks. Return next week if you feel better to get the flu vaccine.  You can eat whatever you want You can wash your clothes together now Start with cortisone cream on your rash on your leg.  Port flush in about 5 weeks Then port flushes every 8 weeks thereafter Return to see the doctor in after your PET scan  Please call the clinic if you have any questions or concerns     Thank you for choosing [place] at [facility] to provide your oncology and hematology care.  To afford each patient quality time with our provider, please arrive at least 15 minutes before your scheduled appointment time.    You need to re-schedule your appointment should you arrive 10 or more minutes late.  We strive to give you quality time with our providers, and arriving late affects you and other patients whose appointments are after yours.  Also, if you no show three or more times for appointments you may be dismissed from the clinic at the providers discretion.     Again, thank you for choosing [facility].  Our hope is that these requests will decrease the amount of time that you wait before being seen by our physicians.       _____________________________________________________________  Should you  have questions after your visit to [facility], please contact our office at [phone] between the hours of 8:30 a.m. and 4:30 p.m.  Voicemails left after 4:30 p.m. will not be returned until the following business day.  For prescription refill requests, have your pharmacy contact our office.

## 2014-12-10 NOTE — Progress Notes (Signed)
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 Final immunophenotype c/w endometrial  CURRENT THERAPY: Carboplatin/Taxol  HISTORY OF PRESENTING ILLNESS:  Alejandra Callahan 69 y.o. female is here for further follow-up of stage IV endometrial cancer.  She has completed 6 cycles of carboplatin/taxol. She had recent imaging and is here today to review.   [name] is here today with several family members. Her husband has recently been in the hospital.  She says she's feeling all right. She says she recently had a little bit of a cold and was coughing some, but she feels it's gotten better than before. She says if she's talking a lot or laughing, she will cough some. She says that when she coughs, nothing is "coming up," but it's like the same hacking cough she had last year. Her daughter states that she has had the cough "a lot" for the past few weeks, ever since her last treatment.  [name] says she didn't have a cold or a cough when she went in for her PET scan, but confirms the ongoing cough. She says she does blow her nose a little bit every morning when she wakes up, too.  She confirms sleeping well, "ever since that steroid got out of her body." [name] says she doesn't feel anxious. She is remaining positive.  She confirms planning for Christmas and getting stuff done in that sense. She did not overexert herself on Thanksgiving. She states  "we aren't going to do anything elaborate, but we are planning for Christmas."  She has had a rash on her left leg that has been itching, but it appears minor. She requested her port area to be checked out, but it also appears fine.  In the last week, she remarks that she had some upset stomach; but that she is not currently taking any medicine for her stomach or bowels.  She needs a flu shot.     Endometrial ca ([place])   11/28/2012 Initial Diagnosis Endometrial ca, Embx Grade 2   12/09/2012 Surgery IAG3, close follow up   07/05/2014 Imaging Chest Xray- multiple new pulmonary nodules consistent with pulmonary metastases.   07/13/2014 PET scan Multiple hypermetabolic pulmonary nodules and masses of varying size in the lungs bilaterally. Imaging features are compatible with metastatic disease.   07/24/2014 Pathology Results Lung, needle/core biopsy(ies), right - ADENOCARCINOMA.   08/06/2014 Procedure Port placed by IR   08/09/2014 -  Chemotherapy Carboplatin/Paclitaxel   10/10/2014 PET scan Interval response to therapy with decrease in size and hypermetabolism of the patient's multiple pulmonary nodules. No new or progressive pulmonary lesions.     MEDICAL HISTORY:  Past Medical History  Diagnosis Date  . Cancer (HCC)     breast  . Arthritis     Rheumatoid arthritis  . PONV (postoperative nausea and vomiting) 1982  . Breast cancer ([place]) 09/1980    left unilateral mastectomy, no chemo or radiatioin  . Endometrial cancer ([place]) 2014    SURGICAL HISTORY: Past Surgical History  Procedure Laterality Date  . Mastectomy  Left 09/1980  . Tubal ligation  April 1982  . Dilatation & currettage/hysteroscopy with resectocope N/A 11/18/2012    Procedure: DILATATION & CURETTAGE/HYSTEROSCOPY WITH RESECTOCOPE;  Surgeon: Marvene Staff, MD;  Location: [facility];  Service: Gynecology;  Laterality: N/A;  . Breast biopsy Left 09/11/80  . Vaginal hysterectomy  12/09/2012    Complete hysterectomy - [place] placement Right     SOCIAL HISTORY: Social History   Social History  . Marital Status: Married    Spouse Name: N/A  . Number of Children: 2  . Years of Education: N/A   Occupational History  . Not on file.   Social History Main Topics  . Smoking status: Never Smoker   . Smokeless tobacco: Never Used  . Alcohol Use: No  . Drug Use: No  . Sexual Activity: Not on file   Other Topics Concern  . Not on file   Social History Narrative  Married, 54 years 2 grandchildren, 2 children Ex-teacher and Microbiologist Non-smoker ETOH, none  FAMILY HISTORY: Family History  Problem Relation Age of Onset  . Breast cancer Mother 39  . Heart attack Mother 48  . Colon cancer Father 31  . Heart attack Father 52  . Breast cancer Sister 20  . Heart attack Paternal Grandfather 70  . Breast cancer Other     mother's paternal first cousin dx in late 40s-early 72s   indicated that her mother is deceased. She indicated that her father is deceased. She indicated that both of her sisters are alive. She indicated that her maternal grandmother is deceased. She indicated that her maternal grandfather is deceased. She indicated that her paternal grandmother is deceased. She indicated that her paternal grandfather is deceased. She indicated that her other is deceased.  Mother deceased at 29, heart attack Father deceased at 51, congested heart failure 2 sisters, one with breast cancer  ALLERGIES:  is allergic to codeine; acyclovir and related; other; tuberculin ppd; and tylox.  MEDICATIONS:  Current Outpatient Prescriptions  Medication Sig Dispense Refill  . alendronate (FOSAMAX) 10 MG tablet Take 70 mg by mouth once a week. Monday    . Calcium-Vitamin D (CALTRATE 600 PLUS-VIT D PO) Take 1 each by mouth 2 (two) times daily.     . cholecalciferol (VITAMIN D) 1000 UNITS tablet Take 1,000 Units by mouth daily.    . folic acid (FOLVITE) 440 MCG tablet Take 800 mcg by mouth daily.      . hydroxychloroquine (PLAQUENIL) 200 MG tablet Take by mouth daily.    Marland Kitchen lidocaine-prilocaine (EMLA) cream Apply a quarter size amount to port site 1 hour prior to chemo. Do not rub in. Cover with plastic wrap. 30 g 3  . methotrexate (RHEUMATREX) 2.5 MG tablet Take 2.5 mg by mouth 2 (two) times a week. Caution:Chemotherapy. Protect from light.  Pt to take 4 tablets on Friday and 4 tablets on Saturday    . Multiple Vitamin (MULTIVITAMIN) tablet Take 1 tablet by mouth daily.    . raloxifene (EVISTA) 60 MG tablet Take 60 mg by mouth daily.    Marland Kitchen zolpidem (AMBIEN) 10 MG tablet Take 1/2 tab to 1 tab at bedtime as needed for trouble sleeping. 30 tablet 0  . acetaminophen (TYLENOL) 500 MG tablet Take 500 mg by mouth every 6 (six) hours as needed for moderate pain.     Marland Kitchen Alum & Mag Hydroxide-Simeth (MAGIC MOUTHWASH W/LIDOCAINE) SOLN Take 5 mLs by mouth 3 (three) times daily  as needed for mouth pain (use as needed for mouth pain). (Patient not taking: Reported on 09/20/2014) 200 mL 3  . CARBOPLATIN IV Inject into the vein every 21 ( twenty-one) days. To start 08/08/14    . dexamethasone (DECADRON) 4 MG tablet Day before chemo: take 5 tablets ('20mg'$ ) in the am and 5 tablets ('20mg'$ ) in the pm. Morning of chemo take 5 tablets ('20mg'$ ). (Patient not taking: Reported on 12/10/2014) 45 tablet 1  . fluconazole (DIFLUCAN) 100 MG tablet Take 200 mg today, then 100 mg daily x 3 (Patient not taking: Reported on 12/10/2014) 5 tablet 0  . Melatonin 3 MG CAPS Take 3 mg by mouth at bedtime as needed.    . naproxen sodium (ANAPROX) 220 MG tablet Take 220 mg by mouth 2 (two) times daily with a meal.    . ondansetron (ZOFRAN) 8 MG tablet Take 1 tablet (8 mg total) by mouth every 8 (eight) hours as needed for nausea or vomiting. (Patient not taking: Reported on 10/11/2014) 30 tablet 2  . PACLitaxel (TAXOL) 300 MG/50ML injection Inject into the vein every 21 ( twenty-one) days. To start 08/08/14    . Pegfilgrastim (NEULASTA ONPRO [place])  Inject into the skin every 21 ( twenty-one) days. To be given 27 hours after chemo (starting 08/09/14)    . predniSONE (DELTASONE) 5 MG tablet Take 5 mg by mouth daily as needed (flare up).     . prochlorperazine (COMPAZINE) 10 MG tablet Take 1 tablet (10 mg total) by mouth every 6 (six) hours as needed (Nausea or vomiting). (Patient not taking: Reported on 11/22/2014) 30 tablet 2   No current facility-administered medications for this visit.    Review of Systems  Respiratory: Positive for cough and runny nose Psychiatric: positive for anxiety All other systems reviewed and are negative.  14 point ROS was done and is otherwise as detailed above or in HPI   PHYSICAL EXAMINATION: ECOG PERFORMANCE STATUS: 0 - Asymptomatic  Filed Vitals:   12/10/14 0923  BP: 105/48  Pulse: 93  Temp: 98.2 F (36.8 C)  Resp: 18   Filed Weights   12/10/14 0923  Weight: 114 lb 6.4 oz (51.891 kg)   Physical Exam  Constitutional: She is oriented to person, place, and time and well-developed, well-nourished, and in no distress. ALopecia HENT:  Head: Normocephalic and atraumatic.  Nose: Nose normal.  Mouth/Throat: Oropharynx is clear and moist. No oropharyngeal exudate.  Eyes: Conjunctivae and EOM are normal. Pupils are equal, round, and reactive to light. Right eye exhibits no discharge. Left eye exhibits no discharge. No scleral icterus.  Neck: Normal range of motion. Neck supple. No tracheal deviation present. No thyromegaly present.  Cardiovascular: Normal rate, regular rhythm and normal heart sounds.  Exam reveals no gallop and no friction rub.   No murmur heard. Pulmonary/Chest: Effort normal and breath sounds normal. She has no wheezes. She has no rales.  Abdominal: Soft. Bowel sounds are normal. She exhibits no distension and no mass. There is no tenderness. There is no rebound and no guarding.  Musculoskeletal: Normal range of motion. She exhibits no edema.  Lymphadenopathy:    She has no  cervical adenopathy.  Neurological: She is alert and oriented to person, place, and time. She has normal reflexes. No cranial nerve deficit. Gait normal. Coordination normal.  Skin: Skin is warm and dry. No rash noted.  Psychiatric: Mood, memory, affect and judgment normal.  Nursing note and vitals reviewed.   RADIOLOGY:  Study Result  CLINICAL DATA: Subsequent initial treatment strategy for metastatic endometrial carcinoma. Undergoing chemotherapy. Restaging.  EXAM: NUCLEAR MEDICINE PET SKULL BASE TO THIGH  TECHNIQUE: 12.8 mCi F-18 FDG was injected intravenously. Full-ring PET imaging was performed from the skull base to thigh after the radiotracer. CT data was obtained and used for attenuation correction and anatomic localization.  FASTING BLOOD GLUCOSE: Value: 74 mg/dl  COMPARISON: 10/10/2014  FINDINGS: NECK  No hypermetabolic lymph nodes in the neck.  CHEST  No hypermetabolic mediastinal or hilar nodes.  Mass in left upper lobe abutting the left heart border shows increased size and hypermetabolic activity since prior study. This currently measures 3.2 x 2.7 cm on image 87/series 3 compared to 2.4 x 1.4 cm previously. This has an SUV max of 9.0 compared to 2.4 previously.  8 mm pulmonary nodule in the right upper lobe on image 72/series 3 remains stable in size, and shows low-level metabolic activity with SUV max of 1.0 with minimal change compared to 1.4 previously. 1.5 cm cavitary lesion in the right lower lobe shows no significant change in size although it does show decreased internal density. This shows low-level metabolic activity, with SUV max of 1.1, and a cavitary metastasis cannot be excluded.  ABDOMEN/PELVIS  No abnormal hypermetabolic activity within the liver, pancreas, adrenal glands, or spleen. 2.4 cm low-attenuation lesion in the inferior right hepatic lobe is stable and shows no metabolic activity.  No hypermetabolic  lymph nodes in the abdomen or pelvis. Prior hysterectomy noted.  SKELETON  Diffuse marrow hypermetabolic activity is again seen likely due to treatment response.  IMPRESSION: Interval progression of left upper lobe pulmonary metastasis. No significant change in small right lung metastases.  No other sites of metastatic disease identified.   Electronically Signed  By: Earle Gell M.D.  On: 12/06/2014 13:16     LABORATORY DATA:  I have reviewed the data as listed Lab Results  Component Value Date   WBC 4.5 11/21/2014   HGB 9.7* 11/21/2014   HCT 29.6* 11/21/2014   MCV 108.8* 11/21/2014   PLT 237 11/21/2014   CMP     Component Value Date/Time   NA 139 11/21/2014 0917   K 3.9 11/21/2014 0917   CL 104 11/21/2014 0917   CO2 29 11/21/2014 0917   GLUCOSE 84 11/21/2014 0917   BUN 16 11/21/2014 0917   CREATININE 0.47 11/21/2014 0917   CALCIUM 9.1 11/21/2014 0917   PROT 6.0* 11/21/2014 0917   ALBUMIN 3.5 11/21/2014 0917   AST 23 11/21/2014 0917   ALT 16 11/21/2014 0917   ALKPHOS 61 11/21/2014 0917   BILITOT 0.2* 11/21/2014 0917   GFRNONAA >60 11/21/2014 0917   GFRAA >60 11/21/2014 0917   Results for KATHELYN, GOMBOS (MRN [id number]) as of 12/10/2014 12:22  Ref. 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 Normal CA-125   Her CA-125  tumor marker is normal at 18.9 U/ml. PET suggests progression in one lung lesion, she currently has evidence of a respiratory infection. I will prescribe Levaquin X 10 days. Reassess symptoms and repeat PET imaging in 6 to 8 weeks.  If we suspect progressive disease she is ER+ and would try tamoxifen, or other hormonal manipulation.    I would like for her to return in one week for a flu vaccination.  If she has worsening symptoms she has been advised to call. Otherwise plan is as detailed above.   Orders Placed This Encounter  Procedures  . NM PET Image Restag (PS) Skull Base To Thigh    Standing Status: Future     Number of Occurrences:      Standing Expiration Date: 12/10/2015    Order Specific Question:  Reason for Exam (SYMPTOM  OR DIAGNOSIS REQUIRED)    Answer:  restage endometrial cancer, ? progression L lung, normal tumor marker,    Order Specific Question:  Preferred imaging location?    Answer:  [facility]    Order Specific Question:  If indicated for the ordered procedure, I authorize the administration of a radiopharmaceutical per Radiology protocol    Answer:  Yes   All questions were answered. The patient knows to call the clinic with any problems, questions or concerns.   This document serves as a record of services personally performed by Ancil Linsey, MD. It was created on her behalf by Toni Amend, a trained medical scribe. The creation of this record is based on the scribe's personal observations and the provider's statements to them. This document has been checked and approved by the attending provider.  I have reviewed the above documentation for accuracy and completeness, and I agree with the above.  This note was electronically signed.    Kelby Fam.  Whitney Muse, MD

## 2014-12-11 ENCOUNTER — Ambulatory Visit (HOSPITAL_COMMUNITY): Payer: Medicare Other | Admitting: Hematology & Oncology

## 2014-12-11 NOTE — Progress Notes (Signed)
Consult Note: Gyn-Onc  Alejandra Callahan 69 y.o. female  CC:  Chief Complaint  Patient presents with  . endometrial cancer    F/U  . Vaginal Discharge   Assessment/Plan: Alejandra Callahan 69 y.o. 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 Incomplete response on PET (vs cavitating tumor necrosis). Agree with plan for empiric Levaquin followed by repeat PET in 2 months. If persistent disease present, would consider Adriamycin.   No evidence of vaginal or vulvar lesions on examination.  HPI: HISTORY: Alejandra Callahan is a 69 y.o. woman who is seen in consultation at the request of Dr. 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 The patient has a history of left breast cancer in 1982. She was treated with radical mastectomy with lymphadenectomy. She did not undergo chemotherapy, radiation, or hormonal therapy. She is experiencing postmenopausal spotting over the last several months and had a normal endometrial biopsy. An endometrial curettage was performed in the operating room which revealed a grade 2 endometrioid adenocarcinoma with squamous differentiation.   The patient has no current complaints. 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 This gene may have a propensity for increased risk of breast and ovarian cancer. She underwent testing secondary to a strong family history and personal history of early breast cancer. She recently had repeat testing that revealed negative genetic testing. Previous testing through [facility] revealed a BARD1 VUS. This was not found on this test, indicating that the BARD1 VUS found at [facility] is most likely thought to be benign through [facility]. 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   Oncology Summary:    Endometrial  cancer    11/28/2012  Initial Diagnosis  PMB --> EMB: Grade 2 EC    12/09/2012  Surgery  RATLH/BSO/PPALND with SLN per [facility] protocol.   Surgical Findings:  Normal appearing uterus, tubes and ovaries. Thin adhesion between colon and left adnexa. Normal appearing pelvic and PA nodes. No evidence of metastatic disease. One sentinel node in left obturator space.  Pathology:  Diagnosis: A: Sentinel lymph node #1, left obturator, excision - Fibrofatty soft tissue, no lymph node identified, no tumor seen   B: Colon, epiploic nodule, excision  - Calcified fibrous tissue with foreign body giant cell reaction, no tumor seen  C: Lymph nodes, left periaortic, regional node excision  - One lymph node with no evidence of carcinoma (0/1)  D: Lymph nodes, right periaortic, regional node excision  - Two lymph nodes with no evidence of carcinoma (0/2)  E: Lymph nodes, left pelvic, regional excision  - Three lymph node with no evidence of carcinoma (0/3)  F: Lymph nodes, right pelvic, regional node excision  - Four lymph nodes with no evidence of carcinoma (0/4)  G: Uterus, cervix, bilateral tubes and ovaries, hysterectomy and bilateral salpingo-oophorectomy   Histologic type: Endometrioid adenocarcinoma with squamous differentiation (G7)  Histologic grade: FIGO grade 3 (architecture grade 2, nuclear grade 3)  Tumor site: Endometrium  Tumor size: 5.2 cm (gross)  Myometrial invasion: Inner half Depth: 4 mm Wall thickness: 13 mm Percent: 31% (G7)   Serosal involvement: Not identified  Lower uterine segment involvement: Not identified  Cervical involvement: Not identified  Adnexal involvement: Not identified  Other involved sites: N/A  Cervical/vaginal margin and distance: Widely free  Lymphovascular space invasion: Not identified  Right ovary: Atrophic ovary with no specific pathologic abnormality, no tumor seen Left ovary: Atrophic ovary with no specific pathologic abnormality, no  tumor seen  Right fallopian tube: No specific pathologic abnormality, no tumor seen Left fallopian tube: No specific pathologic abnormality, no tumor seen  She was staged as a IA grade 3 endoemetrial cancer with low risk features and dispositioned to close followup.   In late June, 2016 she developed a persistent cough. She was seen by her PCP who initially felt these symptoms were related to allergies. When they persisted despite zyrtec prescription, she underwent a CXR imaging. This revealed multiple pulmonary metastases. PET/CT was performed on July 8th, 2016 and revealed multiple pulmonary metastases (largest in the right LL measuring 3.7cm). No other sites of metastatic disease were identified. On July 19th, 2016 she underwent CT guided biopsy of the chest lesion on the right and this returned positive for adenocarcinoma with IHC profile consistent with her prior endometrial cancer (cytokeratin 7, ER, PR, p16, WT-1 positive).  Interval History:  She received 6 cycles of carbo/taxol with Dr Whitney Muse (completed in November, 2016). Post treatment PET on 12/06/14 showed persistent disease in the left chest with resolution of much of the small disease and disease in right lung.  Mass in left upper lobe abutting the left heart border shows increased size (compared to October PET) and hypermetabolic activity since prior study. This currently measures 3.2 x 2.7 cm compared to 2.4 x 1.4 cm previously. This has an SUV max of 9.0 compared to 2.4 previously.  8 mm pulmonary nodule in the right upper lobe remains stable in size, and shows low-level metabolic activity with SUV max of 1.0 with minimal change compared to 1.4 previously. 1.5 cm cavitary lesion in the right lower lobe shows no significant change in size although it does show decreased internal density. This shows low-level metabolic activity, with SUV max of 1.1, and a cavitary metastasis cannot be excluded.  She has been noting some vulvar  discoloration in the past month and "creamy discharge" that concerns her for recurrence.  Review of Systems  Constitutional: Denies fever. Skin: No rash Cardiovascular: No chest pain, shortness of breath Pulmonary: + cough, no wheeze.  Gastro Intestinal: No nausea, vomiting, constipation, or diarrhea reported. No change in bowel movement.  Genitourinary: No frequency, urgency, or dysuria.  Denies vaginal bleeding and discharge.  Musculoskeletal: No myalgia, arthralgia, joint swelling or pain.  Neurologic: No weakness, numbness, or change in gait.  Psychology: No changes   Current Meds:  Outpatient Encounter Prescriptions as of 12/10/2014  Medication Sig  . acetaminophen (TYLENOL) 500 MG tablet Take 500 mg by mouth every 6 (six) hours as needed for moderate pain.   Marland Kitchen alendronate (FOSAMAX) 10 MG tablet Take 70 mg by mouth once a week. Monday  . Alum & Mag Hydroxide-Simeth (MAGIC MOUTHWASH W/LIDOCAINE) SOLN Take 5 mLs by mouth 3 (three) times daily as needed for mouth pain (use as needed for mouth pain). (Patient not taking: Reported on 09/20/2014)  . Calcium-Vitamin D (CALTRATE 600 PLUS-VIT D PO) Take 1 each by mouth 2 (two) times daily.   Marland Kitchen CARBOPLATIN IV Inject into the vein every 21 ( twenty-one) days. To start 08/08/14  . cholecalciferol (VITAMIN D) 1000 UNITS tablet Take 1,000 Units by mouth daily.  Marland Kitchen dexamethasone (DECADRON) 4 MG tablet Day before chemo: take 5 tablets (44m) in the am and 5 tablets (252m in the pm. Morning of chemo take 5  tablets (35m). (Patient not taking: Reported on 12/10/2014)  . fluconazole (DIFLUCAN) 100 MG tablet Take 200 mg today, then 100 mg daily x 3 (Patient not taking: Reported on 12/10/2014)  . folic acid (FOLVITE) 8696MCG tablet Take 800 mcg by mouth daily.   . hydroxychloroquine (PLAQUENIL) 200 MG tablet Take by mouth daily.  .Marland Kitchenlevofloxacin (LEVAQUIN) 500 MG tablet Take 1 tablet (500 mg total) by mouth daily.  .Marland Kitchenlidocaine-prilocaine (EMLA) cream Apply a  quarter size amount to port site 1 hour prior to chemo. Do not rub in. Cover with plastic wrap.  . Melatonin 3 MG CAPS Take 3 mg by mouth at bedtime as needed.  . methotrexate (RHEUMATREX) 2.5 MG tablet Take 2.5 mg by mouth 2 (two) times a week. Caution:Chemotherapy. Protect from light.  Pt to take 4 tablets on Friday and 4 tablets on Saturday  . Multiple Vitamin (MULTIVITAMIN) tablet Take 1 tablet by mouth daily.  . naproxen sodium (ANAPROX) 220 MG tablet Take 220 mg by mouth 2 (two) times daily with a meal.  . ondansetron (ZOFRAN) 8 MG tablet Take 1 tablet (8 mg total) by mouth every 8 (eight) hours as needed for nausea or vomiting. (Patient not taking: Reported on 10/11/2014)  . PACLitaxel (TAXOL) 300 MG/50ML injection Inject into the vein every 21 ( twenty-one) days. To start 08/08/14  . Pegfilgrastim (NEULASTA ONPRO [place]) Inject into the skin every 21 ( twenty-one) days. To be given 27 hours after chemo (starting 08/09/14)  . predniSONE (DELTASONE) 5 MG tablet Take 5 mg by mouth daily as needed (flare up).   . prochlorperazine (COMPAZINE) 10 MG tablet Take 1 tablet (10 mg total) by mouth every 6 (six) hours as needed (Nausea or vomiting). (Patient not taking: Reported on 11/22/2014)  . raloxifene (EVISTA) 60 MG tablet Take 60 mg by mouth daily.  .Marland Kitchenzolpidem (AMBIEN) 10 MG tablet Take 1/2 tab to 1 tab at bedtime as needed for trouble sleeping.   No facility-administered encounter medications on file as of 12/10/2014.    Allergy:  Allergies  Allergen Reactions  . Codeine Other (See Comments)    Blurred vision and dizziness blurred vision, dizzy, loss of hearing  . Acyclovir And Related Swelling, Dermatitis and Rash    fatigue  . Other     TB test- arm swells/fever to extremity  Tegaderm badage- pulls skin off, rash  . Tuberculin Ppd Other (See Comments)    Swelling and redness in arm  . Tylox [Oxycodone-Acetaminophen] Other (See Comments)    Blurred vision and dizziness Blurred vision,  dizziness    Social Hx:   Social History   Social History  . Marital Status: Married    Spouse Name: N/A  . Number of Children: 2  . Years of Education: N/A   Occupational History  . Not on file.   Social History Main Topics  . Smoking status: Never Smoker   . Smokeless tobacco: Never Used  . Alcohol Use: No  . Drug Use: No  . Sexual Activity: Not on file   Other Topics Concern  . Not on file   Social History Narrative    Past Surgical Hx:  Past Surgical History  Procedure Laterality Date  . Mastectomy Left 09/1980  . Tubal ligation  April 1982  . Dilatation & currettage/hysteroscopy with resectocope N/A 11/18/2012    Procedure: DILATATION & CURETTAGE/HYSTEROSCOPY WITH RESECTOCOPE;  Surgeon: SMarvene Staff MD;  [facility];  Service: Gynecology;  Laterality: N/A;  . Breast  biopsy Left 09/11/80  . Vaginal hysterectomy  12/09/2012    Complete hysterectomy - [place] placement Right     Past Medical Hx:  Past Medical History  Diagnosis Date  . Cancer (HCC)     breast  . Arthritis     Rheumatoid arthritis  . PONV (postoperative nausea and vomiting) 1982  . Breast cancer ([place]) 09/1980    left unilateral mastectomy, no chemo or radiatioin  . Endometrial cancer [facility]) 2014    Oncology Hx:    Endometrial ca ([place])   11/28/2012 Initial Diagnosis Endometrial ca, Embx Grade 2   12/09/2012 Surgery IAG3, close follow up   07/05/2014 Imaging Chest Xray- multiple new pulmonary nodules consistent with pulmonary metastases.   07/13/2014 PET scan Multiple hypermetabolic pulmonary nodules and masses of varying size in the lungs bilaterally. Imaging features are compatible with metastatic disease.   07/24/2014 Pathology Results Lung, needle/core biopsy(ies), right - ADENOCARCINOMA.   08/06/2014 Procedure Port placed by IR   08/09/2014 -  Chemotherapy Carboplatin/Paclitaxel   10/10/2014 PET scan Interval response to therapy with decrease in size and  hypermetabolism of the patient's multiple pulmonary nodules. No new or progressive pulmonary lesions.    Family Hx:  Family History  Problem Relation Age of Onset  . Breast cancer Mother 39  . Heart attack Mother 74  . Colon cancer Father 17  . Heart attack Father 22  . Breast cancer Sister 54  . Heart attack Paternal Grandfather 24  . Breast cancer Other     mother's paternal first cousin dx in late 40s-early 15s    Vitals:  Blood pressure 103/47, pulse 90, temperature 98.4 F (36.9 C), temperature source Oral, resp. rate 18, height _0  (1.499 m), weight 114 lb 6.4 oz (51.891 kg).  Physical Exam: Well-nourished well-developed female in no acute distress.  Neck: Supple, no lymphadenopathy no thyromegaly.  Lungs: Clear to auscultation bilaterally.  Cardiovascular: Regular rate and rhythm.  Abdomen: Well-healed surgical incisions. Abdomen is soft, nontender, nondistended. There are no palpable masses or hepatosplenomegaly.  Groins: No lymphadenopathy.  Extremity: No edema. + varicose veins left  Pelvic: Normal appearing vulva (slightly dark discoloration, but within normal limits, no lesions). Vagina normal with no lesions. Cervix and uterus surgically absent. No palpable pelvic masses.  Donaciano Eva, MD 12/11/2014, 4:21 PM

## 2014-12-12 ENCOUNTER — Ambulatory Visit (HOSPITAL_COMMUNITY): Payer: Medicare Other | Admitting: Hematology & Oncology

## 2015-01-14 ENCOUNTER — Ambulatory Visit
Admission: RE | Admit: 2015-01-14 | Discharge: 2015-01-14 | Disposition: A | Discharge status: Home / Self Care | Payer: Medicare Other | Source: Ambulatory Visit | Attending: Hematology & Oncology | Admitting: Hematology & Oncology

## 2015-01-14 DIAGNOSIS — Z0189 Encounter for other specified special examinations: Secondary | ICD-10-CM | POA: Insufficient documentation

## 2015-01-14 DIAGNOSIS — J910 Malignant pleural effusion: Secondary | ICD-10-CM | POA: Insufficient documentation

## 2015-01-14 DIAGNOSIS — C541 Malignant neoplasm of endometrium: Secondary | ICD-10-CM

## 2015-01-14 DIAGNOSIS — R590 Localized enlarged lymph nodes: Secondary | ICD-10-CM | POA: Insufficient documentation

## 2015-01-14 DIAGNOSIS — R918 Other nonspecific abnormal finding of lung field: Secondary | ICD-10-CM | POA: Diagnosis not present

## 2015-01-14 DIAGNOSIS — C7801 Secondary malignant neoplasm of right lung: Secondary | ICD-10-CM

## 2015-01-14 LAB — GLUCOSE, CAPILLARY: GLUCOSE-CAPILLARY: 81 mg/dL (ref 65–99)

## 2015-01-14 MED ORDER — FLUDEOXYGLUCOSE F - 18 (FDG) INJECTION
12.3300 | Freq: Once | INTRAVENOUS | Status: AC | PRN
  Administered 2015-01-14: 12.33 via INTRAVENOUS

## 2015-01-15 ENCOUNTER — Encounter (HOSPITAL_COMMUNITY): Payer: Medicare Other | Attending: Hematology & Oncology | Admitting: Hematology & Oncology

## 2015-01-15 ENCOUNTER — Encounter (HOSPITAL_COMMUNITY): Payer: Medicare Other

## 2015-01-15 ENCOUNTER — Encounter (HOSPITAL_COMMUNITY): Payer: Self-pay | Admitting: Hematology & Oncology

## 2015-01-15 VITALS — BP 90/58 | HR 101 | Temp 97.5°F | Resp 18 | Wt 113.6 lb

## 2015-01-15 DIAGNOSIS — R918 Other nonspecific abnormal finding of lung field: Secondary | ICD-10-CM | POA: Diagnosis present

## 2015-01-15 DIAGNOSIS — C7800 Secondary malignant neoplasm of unspecified lung: Secondary | ICD-10-CM

## 2015-01-15 DIAGNOSIS — Z853 Personal history of malignant neoplasm of breast: Secondary | ICD-10-CM | POA: Diagnosis not present

## 2015-01-15 DIAGNOSIS — C541 Malignant neoplasm of endometrium: Secondary | ICD-10-CM | POA: Diagnosis present

## 2015-01-15 DIAGNOSIS — C7802 Secondary malignant neoplasm of left lung: Secondary | ICD-10-CM

## 2015-01-15 DIAGNOSIS — Z79899 Other long term (current) drug therapy: Secondary | ICD-10-CM | POA: Diagnosis not present

## 2015-01-15 DIAGNOSIS — M069 Rheumatoid arthritis, unspecified: Secondary | ICD-10-CM | POA: Diagnosis not present

## 2015-01-15 DIAGNOSIS — R05 Cough: Secondary | ICD-10-CM

## 2015-01-15 DIAGNOSIS — R059 Cough, unspecified: Secondary | ICD-10-CM

## 2015-01-15 DIAGNOSIS — R079 Chest pain, unspecified: Secondary | ICD-10-CM | POA: Diagnosis not present

## 2015-01-15 DIAGNOSIS — Z95828 Presence of other vascular implants and grafts: Secondary | ICD-10-CM

## 2015-01-15 MED ORDER — SODIUM CHLORIDE 0.9 % IJ SOLN
10.0000 mL | Freq: Once | INTRAMUSCULAR | Status: AC
  Administered 2015-01-15: 10 mL via INTRAVENOUS

## 2015-01-15 MED ORDER — HEPARIN SOD (PORK) LOCK FLUSH 100 UNIT/ML IV SOLN
INTRAVENOUS | Status: AC
  Filled 2015-01-15: qty 5

## 2015-01-15 MED ORDER — HEPARIN SOD (PORK) LOCK FLUSH 100 UNIT/ML IV SOLN
500.0000 [IU] | Freq: Once | INTRAVENOUS | Status: AC
Start: 2015-01-15 — End: 2015-01-15
  Administered 2015-01-15: 500 [IU] via INTRAVENOUS

## 2015-01-15 NOTE — Patient Instructions (Addendum)
[place] at [facility] Discharge Instructions  RECOMMENDATIONS MADE BY THE CONSULTANT AND ANY TEST RESULTS WILL BE SENT TO YOUR REFERRING PHYSICIAN.  Exam and discussion today with Dr. Whitney Muse today. Biopsy of left upper lung nodule with Dr. Geroge Baseman to rule out or determine a second primary cancer. Take 1/2 to 1 pain pill as needed for left upper chest pain. If pain is not relieved with pain meds, go to ER. Follow-up with Dr. Whitney Muse 4 days after biopsy completed.  Port flushes every 8 weeks.    Thank you for choosing [place] at [facility] to provide your oncology and hematology care.  To afford each patient quality time with our provider, please arrive at least 15 minutes before your scheduled appointment time.    You need to re-schedule your appointment should you arrive 10 or more minutes late.  We strive to give you quality time with our providers, and arriving late affects you and other patients whose appointments are after yours.  Also, if you no show three or more times for appointments you may be dismissed from the clinic at the providers discretion.     Again, thank you for choosing [facility].  Our hope is that these requests will decrease the amount of time that you wait before being seen by our physicians.       _____________________________________________________________  Should you have questions after your visit to [facility], please contact our office at [phone] between the hours of 8:30 a.m. and 4:30 p.m.  Voicemails left after 4:30 p.m. will not be returned until the following business day.  For prescription refill requests, have your pharmacy contact our office.

## 2015-01-15 NOTE — Progress Notes (Signed)
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 Final immunophenotype c/w endometrial  CURRENT THERAPY: Carboplatin/Taxol  HISTORY OF PRESENTING ILLNESS:  Alejandra Callahan 70 y.o. female is here for further follow-up of stage IV endometrial cancer.  She has completed 6 cycles of carboplatin/taxol. She had recent imaging that suggested progression in a Left lung lesion, she had a concurrent respiratory infection. She completed a course of antibiotics and has had repeat imaging.   Mrs. Curling is accompanied by her son and son-in-law. I discussed the results of her recent PET imaging and the implications of these findings.   Her son-in-law reports that her cough has gotten progressively worse.  The patient states that on Sunday, she felt pain in her left upper chest believing it was heart related due to the location. It was sore for a few hours and has resolved without any issues since. She has hydrocodone pills at home but has not taken it. She continues to eat and feel well, other than her cough and the intermittent chest pain.  Reports left shoulder pain.     Endometrial ca ([place])   11/28/2012 Initial Diagnosis Endometrial ca, Embx Grade 2   12/09/2012 Surgery IAG3, close follow up   07/05/2014 Imaging Chest Xray- multiple new pulmonary nodules consistent with pulmonary metastases.   07/13/2014 PET scan Multiple hypermetabolic pulmonary nodules and masses of  varying size in the lungs bilaterally. Imaging features are compatible with metastatic disease.   07/24/2014 Pathology Results Lung, needle/core biopsy(ies), right - ADENOCARCINOMA.   08/06/2014 Procedure Port placed by IR   08/09/2014 - 11/22/2014 Chemotherapy Carboplatin/Paclitaxel   10/10/2014 PET scan Interval response to therapy with decrease in size and hypermetabolism of the patient's multiple pulmonary nodules. No new or progressive pulmonary lesions.   12/06/2014 PET scan Interval progression of LUL pulmonary metastases. No significant change in small right lung mets. No other sites of metastatic disease identified   01/14/2015 PET scan Significant interval progrssion of hypermetabolic LUL lung mass, new small malignant L pleural effusion with associated left pleural thickening and hypermetab anteriorly adjacent to LUL mass, Mildly Hypermet L mediatinal adenop     MEDICAL HISTORY:  Past Medical History  Diagnosis Date  . Cancer (HCC)     breast  . Arthritis     Rheumatoid arthritis  . PONV (postoperative nausea and vomiting) 1982  . Breast cancer ([place]) 09/1980    left unilateral mastectomy, no chemo or radiatioin  . Endometrial cancer ([place]) 2014    SURGICAL HISTORY: Past Surgical History  Procedure Laterality Date  . Mastectomy Left 09/1980  . Tubal ligation  April 1982  . Dilatation & currettage/hysteroscopy with resectocope N/A 11/18/2012    Procedure: DILATATION & CURETTAGE/HYSTEROSCOPY WITH RESECTOCOPE;  Surgeon: Marvene Staff, MD;  Location: [facility];  Service: Gynecology;  Laterality: N/A;  . Breast biopsy Left 09/11/80  . Vaginal hysterectomy  12/09/2012    Complete hysterectomy - [place] placement Right     SOCIAL HISTORY: Social History   Social History  .  Marital Status: Married    Spouse Name: N/A  . Number of Children: 2  . Years of Education: N/A   Occupational History  . Not on file.   Social History Main Topics  . Smoking status:  Never Smoker   . Smokeless tobacco: Never Used  . Alcohol Use: No  . Drug Use: No  . Sexual Activity: Not on file   Other Topics Concern  . Not on file   Social History Narrative  Married, 23 years 2 grandchildren, 2 children Ex-teacher and Microbiologist Non-smoker ETOH, none  FAMILY HISTORY: Family History  Problem Relation Age of Onset  . Breast cancer Mother 18  . Heart attack Mother 46  . Colon cancer Father 59  . Heart attack Father 59  . Breast cancer Sister 90  . Heart attack Paternal Grandfather 26  . Breast cancer Other     mother's paternal first cousin dx in late 40s-early 17s   indicated that her mother is deceased. She indicated that her father is deceased. She indicated that both of her sisters are alive. She indicated that her maternal grandmother is deceased. She indicated that her maternal grandfather is deceased. She indicated that her paternal grandmother is deceased. She indicated that her paternal grandfather is deceased. She indicated that her other is deceased.  Mother deceased at 81, heart attack Father deceased at 51, congested heart failure 2 sisters, one with breast cancer  ALLERGIES:  is allergic to codeine; acyclovir and related; other; tuberculin ppd; and tylox.  MEDICATIONS:  Current Outpatient Prescriptions  Medication Sig Dispense Refill  . acetaminophen (TYLENOL) 500 MG tablet Take 500 mg by mouth every 6 (six) hours as needed for moderate pain.     Marland Kitchen alendronate (FOSAMAX) 10 MG tablet Take 70 mg by mouth once a week. Monday    . Calcium-Vitamin D (CALTRATE 600 PLUS-VIT D PO) Take 1 each by mouth 2 (two) times daily.     . cholecalciferol (VITAMIN D) 1000 UNITS tablet Take 1,000 Units by mouth daily.    . folic acid (FOLVITE) 992 MCG tablet Take 800 mcg by mouth daily.     . methotrexate (RHEUMATREX) 2.5 MG tablet Take 2.5 mg by mouth 2 (two) times a week. Caution:Chemotherapy. Protect from light.  Pt to take 4 tablets on Friday and  4 tablets on Saturday    . Multiple Vitamin (MULTIVITAMIN) tablet Take 1 tablet by mouth daily.    . predniSONE (DELTASONE) 5 MG tablet Take 5 mg by mouth daily as needed (flare up).     . raloxifene (EVISTA) 60 MG tablet Take 60 mg by mouth daily.    . hydroxychloroquine (PLAQUENIL) 200 MG tablet Take by mouth daily. Reported on 01/15/2015    . lidocaine-prilocaine (EMLA) cream Apply a quarter size amount to port site 1 hour prior to chemo. Do not rub in. Cover with plastic wrap. (Patient not taking: Reported on 01/15/2015) 30 g 3  . naproxen sodium (ANAPROX) 220 MG tablet Take 220 mg by mouth 2 (two) times daily with a meal.     No current facility-administered medications for this visit.    Review of Systems  Cardiovascular: Positive for chest pain.     Pain in the left upper chest, most likely cancer related. Respiratory: Positive for cough.     Worsened. Musculoskeletal: Positive for shoulder pain, intermittent  All other systems reviewed and are negative.  14 point ROS was done and is otherwise as detailed above or in HPI  PHYSICAL EXAMINATION: ECOG PERFORMANCE STATUS: 0 - Asymptomatic  Filed Vitals:   01/15/15 0935  BP: 90/58  Pulse: 101  Temp: 97.5 F (36.4 C)  Resp: 18   Filed Weights   01/15/15 0935  Weight: 113 lb 9.6 oz (51.529 kg)   Physical Exam  Constitutional: She is oriented to person, place, and time and well-developed, well-nourished, and in no distress.  HENT:  Head: Normocephalic and atraumatic.  Nose: Nose normal.  Mouth/Throat: Oropharynx is clear and moist. No oropharyngeal exudate.  Eyes: Conjunctivae and EOM are normal. Pupils are equal, round, and reactive to light. Right eye exhibits no discharge. Left eye exhibits no discharge. No scleral icterus.  Neck: Normal range of motion. Neck supple. No tracheal deviation present. No thyromegaly present.  Cardiovascular: Normal rate, regular rhythm and normal heart sounds.  Exam reveals no gallop and no  friction rub.   No murmur heard. Pulmonary/Chest: Decreased breath sounds in the left upper lobe. She has no wheezes. She has no rales.  Abdominal: Soft. Bowel sounds are normal. She exhibits no distension and no mass. There is no tenderness. There is no rebound and no guarding.  Musculoskeletal: Normal range of motion. She exhibits no edema.  Lymphadenopathy:    She has no cervical adenopathy.  Neurological: She is alert and oriented to person, place, and time. She has normal reflexes. No cranial nerve deficit. Gait normal. Coordination normal.  Skin: Skin is warm and dry. No rash noted.  Psychiatric: Mood, memory, affect and judgment normal.  Nursing note and vitals reviewed.   RADIOLOGY: I have personally reviewed the radiological images as listed and agreed with the findings in the report.  CLINICAL DATA: Subsequent treatment strategy for endometrial carcinoma diagnosed in November 2014, status post TAHBSO on 12/09/2012, with lung metastases detected in July 2016, status post chemotherapy, with progression of left upper lobe pulmonary metastasis described on prior PET-CT. Remote history of left breast cancer.  EXAM: NUCLEAR MEDICINE PET SKULL BASE TO THIGH  TECHNIQUE: 12.3 mCi F-18 FDG was injected intravenously. Full-ring PET imaging was performed from the skull base to thigh after the radiotracer. CT data was obtained and used for attenuation correction and anatomic localization.  FASTING BLOOD GLUCOSE: Value: 81 mg/dl  COMPARISON: 12/06/2014 PET-CT.  FINDINGS: NECK  No hypermetabolic lymph nodes in the neck.  CHEST  There is an irregular hypermetabolic anterior medial left upper lobe lung mass (series 3/image 81) with max SUV 15.0, which has significantly increased in size and metabolism, previously 3.6 x 2.8 cm with max SUV 9.0. There is new hypermetabolism with associated pleural thickening within the anterior left pleural space with max SUV 4.6,  with associated small layering left pleural effusion, in keeping with a malignant left pleural effusion.  There are 3 subcentimeter right upper lobe pulmonary nodules measuring up to 0.8 cm in size (series 3/image 60), which are unchanged in size and demonstrate no significant metabolism.  There are newly mildly hypermetabolic subcentimeter left mediastinal lymph nodes, including a 0.7 cm AP window node (series 3/image 65) with max SUV 2.6, a 0.7 cm prevascular node (series 3/image 65) with max SUV 2.3 and a 0.7 cm upper left paratracheal node (series 3/image 53) with max SUV 2.3.  No hypermetabolic axillary nodes. Left axillary nodal dissection clips are again noted. Stable appearance of the chest wall status post left mastectomy. No hypermetabolic superficial chest wall masses. Right internal jugular MediPort terminates at the cavoatrial junction.  ABDOMEN/PELVIS  No abnormal hypermetabolic activity within the  liver, pancreas, adrenal glands, or spleen. No hypermetabolic lymph nodes in the abdomen or pelvis. Stable non hypermetabolic 2.2 cm inferior right liver lobe cyst. Hysterectomy.  SKELETON  No focal hypermetabolic activity to suggest skeletal metastasis. Previously described reactive marrow uptake has resolved.  IMPRESSION: 1. Significant interval progression of hypermetabolic left upper lobe lung mass. 2. New small malignant left pleural effusion with associated left pleural thickening and hypermetabolism anteriorly adjacent to the progressive left upper lobe lung mass. 3. Newly mildly hypermetabolic left mediastinal lymphadenopathy, likely representing mediastinal nodal metastases. 4. Right pulmonary nodules are stable and non hypermetabolic. 5. No hypermetabolic metastatic disease in the neck, abdomen or pelvis. No hypermetabolic bone metastases.   Electronically Signed  By: Ilona Sorrel M.D.  On: 01/14/2015 13:06  LABORATORY DATA:  I have  reviewed the data as listed Lab Results  Component Value Date   WBC 4.5 11/21/2014   HGB 9.7* 11/21/2014   HCT 29.6* 11/21/2014   MCV 108.8* 11/21/2014   PLT 237 11/21/2014   CMP     Component Value Date/Time   NA 139 11/21/2014 0917   K 3.9 11/21/2014 0917   CL 104 11/21/2014 0917   CO2 29 11/21/2014 0917   GLUCOSE 84 11/21/2014 0917   BUN 16 11/21/2014 0917   CREATININE 0.47 11/21/2014 0917   CALCIUM 9.1 11/21/2014 0917   PROT 6.0* 11/21/2014 0917   ALBUMIN 3.5 11/21/2014 0917   AST 23 11/21/2014 0917   ALT 16 11/21/2014 0917   ALKPHOS 61 11/21/2014 0917   BILITOT 0.2* 11/21/2014 0917   GFRNONAA >60 11/21/2014 0917   GFRAA >60 11/21/2014 0917   Results for THREASA, KINCH (MRN [id number])   Ref. 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 Abnormal CXR History of breast cancer diagnosed and treated in 1982 Radical mastectomy with lymphadenectomy History of endometrial carcinoma, endometrioid adenocarcinoma with squamous differentiation, Stage IA Grade 3  12/09/2012 RATLH/BSO/PPALND with SLN per [facility] protocol BARD 1 heterozygote on original genetic testing, repeat testing negative 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 Normal CA-125 PET/CT 01/14/2015 with further progression LUL metastases   I discussed the results of her recent PET imaging and the implications of these findings. I will share these findings with Dr. Denman George.  She will be scheduled for a left upper lobe biopsy to investigate if this lesion is endometrial cancer. I  feel this needs to be done because the remainder of her disease remains in remission and I have certainly treated patients over the years with multiple malignancies. If it is her endometrial carcinoma, we will discuss further treatment options at follow-up  Advised the patient that it would be okay to take half a hydrocodone if chest pain returns. However, if she takes pain medication and the pain does not resolve, she should go to the ER.  We will see her back 4 days after her biopsy to discuss these results.   Orders Placed This Encounter  Procedures  . CT Biopsy    Standing Status: Future     Number of Occurrences:      Standing Expiration Date: 01/15/2016  Order Specific Question:  Lab orders requested (DO NOT place separate lab orders, these will be automatically ordered during procedure specimen collection):    Answer:  Surgical Pathology    Order Specific Question:  Reason for Exam (SYMPTOM  OR DIAGNOSIS REQUIRED)    Answer:  endometrial CA with response to therapy except LUL, please biopsy, discuss with Dr. Laurence Ferrari    Order Specific Question:  Preferred imaging location?    Answer:  [facility]    Standing Status: Future     Number of Occurrences: 1     Standing Expiration Date: 01/15/2016    All questions were answered. The patient knows to call the clinic with any problems, questions or concerns.   This document serves as a record of services personally performed by Ancil Linsey, MD. It was created on her behalf by Arlyce Harman, a trained medical scribe. The creation of this record is based on the scribe's personal observations and the provider's statements to them. This document has been checked and approved by the attending provider.  I have reviewed the above documentation for accuracy and completeness, and I agree with the above.  This note was electronically signed.    Kelby Fam. Whitney Muse, MD

## 2015-01-15 NOTE — Progress Notes (Signed)
Alejandra Callahan presented for Portacath access and flush. Proper placement of portacath confirmed by CXR. Portacath located right chest wall accessed with  H 20 needle. Good blood return present. Portacath flushed with 72m NS and 500U/553mHeparin and needle removed intact. Procedure without incident. Patient tolerated procedure well.

## 2015-01-16 LAB — CA 125: CA 125: 31.8 U/mL (ref 0.0–38.1)

## 2015-01-17 ENCOUNTER — Other Ambulatory Visit: Payer: Self-pay | Admitting: Radiology

## 2015-01-18 ENCOUNTER — Ambulatory Visit (HOSPITAL_COMMUNITY)
Admission: RE | Admit: 2015-01-18 | Discharge: 2015-01-18 | Disposition: A | Discharge status: Home / Self Care | Payer: Medicare Other | Source: Ambulatory Visit | Attending: Hematology & Oncology | Admitting: Hematology & Oncology

## 2015-01-18 ENCOUNTER — Encounter (HOSPITAL_COMMUNITY): Payer: Self-pay

## 2015-01-18 DIAGNOSIS — Z853 Personal history of malignant neoplasm of breast: Secondary | ICD-10-CM | POA: Insufficient documentation

## 2015-01-18 DIAGNOSIS — R918 Other nonspecific abnormal finding of lung field: Secondary | ICD-10-CM | POA: Insufficient documentation

## 2015-01-18 DIAGNOSIS — Z79899 Other long term (current) drug therapy: Secondary | ICD-10-CM | POA: Insufficient documentation

## 2015-01-18 DIAGNOSIS — Z9012 Acquired absence of left breast and nipple: Secondary | ICD-10-CM | POA: Diagnosis not present

## 2015-01-18 DIAGNOSIS — Z803 Family history of malignant neoplasm of breast: Secondary | ICD-10-CM | POA: Insufficient documentation

## 2015-01-18 DIAGNOSIS — Z885 Allergy status to narcotic agent status: Secondary | ICD-10-CM | POA: Diagnosis not present

## 2015-01-18 DIAGNOSIS — C541 Malignant neoplasm of endometrium: Secondary | ICD-10-CM | POA: Insufficient documentation

## 2015-01-18 DIAGNOSIS — Z9889 Other specified postprocedural states: Secondary | ICD-10-CM

## 2015-01-18 DIAGNOSIS — Z7983 Long term (current) use of bisphosphonates: Secondary | ICD-10-CM | POA: Diagnosis not present

## 2015-01-18 DIAGNOSIS — M069 Rheumatoid arthritis, unspecified: Secondary | ICD-10-CM | POA: Diagnosis not present

## 2015-01-18 HISTORY — PX: LUNG BIOPSY: SHX232

## 2015-01-18 LAB — CBC
HCT: 30.8 % — ABNORMAL LOW (ref 36.0–46.0)
Hemoglobin: 10.3 g/dL — ABNORMAL LOW (ref 12.0–15.0)
MCH: 36.1 pg — ABNORMAL HIGH (ref 26.0–34.0)
MCHC: 33.4 g/dL (ref 30.0–36.0)
MCV: 108.1 fL — ABNORMAL HIGH (ref 78.0–100.0)
Platelets: 176 10*3/uL (ref 150–400)
RBC: 2.85 MIL/uL — ABNORMAL LOW (ref 3.87–5.11)
RDW: 15.3 % (ref 11.5–15.5)
WBC: 6 10*3/uL (ref 4.0–10.5)

## 2015-01-18 LAB — APTT: aPTT: 26 seconds (ref 24–37)

## 2015-01-18 LAB — PROTIME-INR
INR: 1.1 (ref 0.00–1.49)
Prothrombin Time: 14.4 seconds (ref 11.6–15.2)

## 2015-01-18 MED ORDER — MIDAZOLAM HCL 2 MG/2ML IJ SOLN
INTRAMUSCULAR | Status: AC | PRN
  Administered 2015-01-18: 1 mg via INTRAVENOUS

## 2015-01-18 MED ORDER — FENTANYL CITRATE (PF) 100 MCG/2ML IJ SOLN
INTRAMUSCULAR | Status: AC
  Filled 2015-01-18: qty 2

## 2015-01-18 MED ORDER — SODIUM CHLORIDE 0.9 % IV SOLN: Freq: Once | INTRAVENOUS | Status: DC

## 2015-01-18 MED ORDER — FENTANYL CITRATE (PF) 100 MCG/2ML IJ SOLN
INTRAMUSCULAR | Status: AC | PRN
  Administered 2015-01-18: 50 ug via INTRAVENOUS
  Administered 2015-01-18: 25 ug via INTRAVENOUS

## 2015-01-18 MED ORDER — LIDOCAINE HCL 1 % IJ SOLN
INTRAMUSCULAR | Status: AC
  Filled 2015-01-18: qty 20

## 2015-01-18 MED ORDER — MIDAZOLAM HCL 2 MG/2ML IJ SOLN
INTRAMUSCULAR | Status: AC
  Filled 2015-01-18: qty 4

## 2015-01-18 NOTE — H&P (Signed)
Chief Complaint: Left upper lobe lung mass History of Endometrial cancer  Referring Physician(s): [name],[name]  History of Present Illness: Alejandra Callahan is a 70 y.o. female with history of breast cancer diagnosed and treated in 1982 and more recent diagnosis of endometrial cancer diagnosed in December of 2014.  She is under the care of Dr. Whitney Muse and has been receiving treatment for her endometrial cancer.  She is known to our service as she underwent a right lung biopsy by Dr. Barbie Banner in July of 2016 and then had a Port A Cath placed by Dr. Annamaria Boots in August of 2016.  She reports she recently developed some chest and left shoulder pain and a cough.  PET done in October showed response to therapy and decreas in size of multiple pulmonary nodules on the right with no new lesions.  Then PET done December 1 showed new progression of left upper lobe pulmonary metastasis.  PET done January 9 showed significant interval progression of hypermetabolic left upper lobe lung mass with new small effusion.  She is here today to undergo CT guided biopsy of the LUL mass.  She is NPO. She is not taking blood thinners.  She feels well today and denies any recent fever or chills.  Past Medical History  Diagnosis Date  . Cancer (HCC)     breast  . Arthritis     Rheumatoid arthritis  . PONV (postoperative nausea and vomiting) 1982  . Breast cancer ([place]) 09/1980    left unilateral mastectomy, no chemo or radiatioin  . Endometrial cancer ([place]) 2014    Past Surgical History  Procedure Laterality Date  . Mastectomy Left 09/1980  . Tubal ligation  April 1982  . Dilatation & currettage/hysteroscopy with resectocope N/A 11/18/2012    Procedure: DILATATION & CURETTAGE/HYSTEROSCOPY WITH RESECTOCOPE;  Surgeon: Marvene Staff, MD;  Location: [facility];  Service: Gynecology;  Laterality: N/A;  . Breast biopsy Left 09/11/80  . Vaginal hysterectomy  12/09/2012    Complete hysterectomy - [place] placement Right     Allergies: Codeine; Acyclovir and related; Other; Tuberculin ppd; and Tylox  Medications: Prior to Admission medications   Medication Sig Start Date End Date Taking? Authorizing Provider  alendronate (FOSAMAX) 70 MG tablet Take 70 mg by mouth once a week. Take with a full glass of water on an empty stomach. Monday   Yes Historical Provider, MD  Calcium-Vitamin D (CALTRATE 600 PLUS-VIT D PO) Take 1 each by mouth 2 (two) times daily.    Yes Historical Provider, MD  cholecalciferol (VITAMIN D) 1000 UNITS tablet Take 1,000 Units by mouth daily.   Yes Historical Provider, MD  folic acid (FOLVITE) 326 MCG tablet Take 800 mcg by mouth daily.    Yes Historical Provider, MD  hydroxychloroquine (PLAQUENIL) 200 MG tablet Take 200 mg by mouth daily. Reported on 01/15/2015   Yes Historical Provider, MD  lidocaine-prilocaine (EMLA) cream Apply a quarter size amount to port site 1 hour prior to chemo. Do not rub in. Cover with plastic wrap. Patient taking differently: Apply 1 application topically See admin instructions. Apply a quarter size amount to port site 1 hour prior to chemo as needed Do not rub in. Cover with plastic wrap. 08/01/14  Yes Patrici Ranks, MD  methotrexate (RHEUMATREX) 2.5 MG tablet Take 2.5 mg by mouth 2 (two) times a week. Caution:Chemotherapy. Protect from light.  Pt to take 4 tablets on Friday and 4 tablets on Saturday  Yes Historical Provider, MD  Multiple Vitamin (MULTIVITAMIN) tablet Take 1 tablet by mouth daily.   Yes Historical Provider, MD  naproxen sodium (ANAPROX) 220 MG tablet Take 220 mg by mouth 2 (two) times daily with a meal.   Yes Historical Provider, MD  predniSONE (DELTASONE) 5 MG tablet Take 5 mg by mouth daily as needed (flare up).    Yes Historical Provider, MD  raloxifene (EVISTA) 60 MG tablet Take 60 mg by mouth daily.   Yes Historical Provider, MD     Family History  Problem Relation Age of Onset  . Breast cancer  Mother 44  . Heart attack Mother 37  . Colon cancer Father 60  . Heart attack Father 30  . Breast cancer Sister 58  . Heart attack Paternal Grandfather 42  . Breast cancer Other     mother's paternal first cousin dx in late 40s-early 73s    Social History   Social History  . Marital Status: Married    Spouse Name: N/A  . Number of Children: 2  . Years of Education: N/A   Social History Main Topics  . Smoking status: Never Smoker   . Smokeless tobacco: Never Used  . Alcohol Use: No  . Drug Use: No  . Sexual Activity: Not Asked   Other Topics Concern  . None   Social History Narrative    Review of Systems  Constitutional: Positive for activity change and fatigue. Negative for fever, chills and appetite change.  HENT: Negative.   Respiratory: Positive for cough. Negative for shortness of breath and wheezing.   Cardiovascular: Positive for chest pain.  Gastrointestinal: Negative for nausea, vomiting and abdominal pain.  Genitourinary: Negative.   Musculoskeletal:       Left shoulder pain  Skin: Negative.   Neurological: Negative.   Psychiatric/Behavioral: Negative.     Vital Signs: BP 104/47 mmHg  Pulse 84  Temp(Src) 98 F (36.7 C) (Oral)  Resp 16  Ht '4\' 11"'$  (1.499 m)  Wt 110 lb (49.896 kg)  BMI 22.21 kg/m2  SpO2 99%  Physical Exam  Constitutional: She is oriented to person, place, and time. She appears well-developed and well-nourished.  HENT:  Head: Normocephalic and atraumatic.  Eyes: EOM are normal.  Neck: Normal range of motion. Neck supple.  Cardiovascular: Normal rate, regular rhythm and normal heart sounds.   Pulmonary/Chest: Effort normal and breath sounds normal.  Abdominal: Soft. Bowel sounds are normal. She exhibits no distension. There is no tenderness.  Musculoskeletal: Normal range of motion.  Neurological: She is alert and oriented to person, place, and time.  Skin: Skin is warm and dry.  Psychiatric: She has a normal mood and affect.  Her behavior is normal. Judgment and thought content normal.  Vitals reviewed.   Mallampati Score:  MD Evaluation Airway: WNL Heart: WNL Abdomen: WNL Chest/ Lungs: WNL ASA  Classification: 3 Mallampati/Airway Score: One  Imaging: Nm Pet Image Restag (ps) Skull Base To Thigh  01/14/2015  CLINICAL DATA:  Subsequent treatment strategy for endometrial carcinoma diagnosed in November 2014, status post TAHBSO on 12/09/2012, with lung metastases detected in July 2016, status post chemotherapy, with progression of left upper lobe pulmonary metastasis described on prior PET-CT. Remote history of left breast cancer. EXAM: NUCLEAR MEDICINE PET SKULL BASE TO THIGH TECHNIQUE: 12.3 mCi F-18 FDG was injected intravenously. Full-ring PET imaging was performed from the skull base to thigh after the radiotracer. CT data was obtained and used for attenuation correction and anatomic localization. FASTING  BLOOD GLUCOSE:  Value: 81 mg/dl COMPARISON:  12/06/2014 PET-CT. FINDINGS: NECK No hypermetabolic lymph nodes in the neck. CHEST There is an irregular hypermetabolic anterior medial left upper lobe lung mass (series 3/image 81) with max SUV 15.0, which has significantly increased in size and metabolism, previously 3.6 x 2.8 cm with max SUV 9.0. There is new hypermetabolism with associated pleural thickening within the anterior left pleural space with max SUV 4.6, with associated small layering left pleural effusion, in keeping with a malignant left pleural effusion. There are 3 subcentimeter right upper lobe pulmonary nodules measuring up to 0.8 cm in size (series 3/image 60), which are unchanged in size and demonstrate no significant metabolism. There are newly mildly hypermetabolic subcentimeter left mediastinal lymph nodes, including a 0.7 cm AP window node (series 3/image 65) with max SUV 2.6, a 0.7 cm prevascular node (series 3/image 65) with max SUV 2.3 and a 0.7 cm upper left paratracheal node (series 3/image 53)  with max SUV 2.3. No hypermetabolic axillary nodes. Left axillary nodal dissection clips are again noted. Stable appearance of the chest wall status post left mastectomy. No hypermetabolic superficial chest wall masses. Right internal jugular MediPort terminates at the cavoatrial junction. ABDOMEN/PELVIS No abnormal hypermetabolic activity within the liver, pancreas, adrenal glands, or spleen. No hypermetabolic lymph nodes in the abdomen or pelvis. Stable non hypermetabolic 2.2 cm inferior right liver lobe cyst. Hysterectomy. SKELETON No focal hypermetabolic activity to suggest skeletal metastasis. Previously described reactive marrow uptake has resolved. IMPRESSION: 1. Significant interval progression of hypermetabolic left upper lobe lung mass. 2. New small malignant left pleural effusion with associated left pleural thickening and hypermetabolism anteriorly adjacent to the progressive left upper lobe lung mass. 3. Newly mildly hypermetabolic left mediastinal lymphadenopathy, likely representing mediastinal nodal metastases. 4. Right pulmonary nodules are stable and non hypermetabolic. 5. No hypermetabolic metastatic disease in the neck, abdomen or pelvis. No hypermetabolic bone metastases. Electronically Signed   By: Ilona Sorrel M.D.   On: 01/14/2015 13:06    Labs:  CBC:  Recent Labs  10/10/14 0848 10/31/14 0953 11/21/14 0917 01/18/15 0908  WBC 6.8 7.4 4.5 6.0  HGB 11.2* 10.5* 9.7* 10.3*  HCT 33.1* 31.3* 29.6* 30.8*  PLT 245 232 237 176    COAGS:  Recent Labs  07/24/14 0806 08/06/14 1150 01/18/15 0908  INR 0.97 1.07 1.10  APTT  --   --  26    BMP:  Recent Labs  09/19/14 1126 10/10/14 0848 10/31/14 0938 11/21/14 0917  NA 135 139 137 139  K 4.1 3.6 4.0 3.9  CL 102 103 100* 104  CO2 '28 29 30 29  '$ GLUCOSE 93 79 87 84  BUN '16 13 17 16  '$ CALCIUM 8.8* 8.8* 9.2 9.1  CREATININE 0.46 0.51 0.53 0.47  GFRNONAA >60 >60 >60 >60  GFRAA >60 >60 >60 >60    LIVER FUNCTION  TESTS:  Recent Labs  09/19/14 1126 10/10/14 0848 10/31/14 0938 11/21/14 0917  BILITOT 0.5 0.5 0.6 0.2*  AST '23 22 21 23  '$ ALT '19 18 17 16  '$ ALKPHOS 65 66 66 61  PROT 6.2* 6.1* 6.3* 6.0*  ALBUMIN 3.7 3.7 3.7 3.5    TUMOR MARKERS: No results for input(s): AFPTM, CEA, CA199, CHROMGRNA in the last 8760 hours.  Assessment and Plan:  Endometrial Cancer currently undergoing therapy  New left upper lobe lung mass concerning for metastatic disease.  Will proceed with CT guided biopsy today by Dr. Barbie Banner  Thank you for this interesting consult.  I greatly enjoyed meeting CHARVI GAMMAGE and look forward to participating in their care.  A copy of this report was sent to the requesting provider on this date.  Electronically Signed: Murrell Redden PA-C 01/18/2015, 9:48 AM   I spent a total of  25 Minutes in face to face in clinical consultation, greater than 50% of which was counseling/coordinating care for CT guided biopsy

## 2015-01-18 NOTE — Sedation Documentation (Signed)
Bandaid to L upper chest intact.

## 2015-01-18 NOTE — Discharge Instructions (Signed)

## 2015-01-18 NOTE — Procedures (Signed)
LUL lung mass Bx 18 g core times 4 No comp/EBL

## 2015-01-18 NOTE — Sedation Documentation (Signed)
Patient is resting comfortably. 

## 2015-01-22 ENCOUNTER — Other Ambulatory Visit (HOSPITAL_COMMUNITY): Payer: Self-pay | Admitting: Hematology & Oncology

## 2015-01-22 DIAGNOSIS — C7800 Secondary malignant neoplasm of unspecified lung: Secondary | ICD-10-CM

## 2015-01-22 DIAGNOSIS — C541 Malignant neoplasm of endometrium: Secondary | ICD-10-CM

## 2015-01-23 ENCOUNTER — Telehealth (HOSPITAL_COMMUNITY): Payer: Self-pay | Admitting: *Deleted

## 2015-01-23 ENCOUNTER — Other Ambulatory Visit (HOSPITAL_COMMUNITY): Payer: Self-pay | Admitting: *Deleted

## 2015-01-24 ENCOUNTER — Ambulatory Visit (HOSPITAL_COMMUNITY)
Admission: RE | Admit: 2015-01-24 | Discharge: 2015-01-24 | Disposition: A | Discharge status: Home / Self Care | Payer: Medicare Other | Source: Ambulatory Visit | Attending: Hematology & Oncology | Admitting: Hematology & Oncology

## 2015-01-24 DIAGNOSIS — R918 Other nonspecific abnormal finding of lung field: Secondary | ICD-10-CM | POA: Insufficient documentation

## 2015-01-24 DIAGNOSIS — C541 Malignant neoplasm of endometrium: Secondary | ICD-10-CM | POA: Diagnosis present

## 2015-01-24 DIAGNOSIS — C7800 Secondary malignant neoplasm of unspecified lung: Secondary | ICD-10-CM

## 2015-01-24 LAB — POCT I-STAT CREATININE: CREATININE: 0.6 mg/dL (ref 0.44–1.00)

## 2015-01-24 MED ORDER — IOHEXOL 300 MG/ML  SOLN
75.0000 mL | Freq: Once | INTRAMUSCULAR | Status: AC | PRN
  Administered 2015-01-24: 75 mL via INTRAVENOUS

## 2015-01-25 ENCOUNTER — Ambulatory Visit (HOSPITAL_COMMUNITY): Payer: Medicare Other | Admitting: Hematology & Oncology

## 2015-01-28 ENCOUNTER — Encounter: Payer: Medicare Other | Admitting: Thoracic Surgery (Cardiothoracic Vascular Surgery)

## 2015-01-31 ENCOUNTER — Encounter: Payer: Medicare Other | Admitting: Thoracic Surgery (Cardiothoracic Vascular Surgery)

## 2015-02-01 ENCOUNTER — Encounter (HOSPITAL_COMMUNITY)
Admission: RE | Admit: 2015-02-01 | Discharge: 2015-02-01 | Disposition: A | Discharge status: Home / Self Care | Payer: Medicare Other | Source: Ambulatory Visit | Attending: Thoracic Surgery (Cardiothoracic Vascular Surgery) | Admitting: Thoracic Surgery (Cardiothoracic Vascular Surgery)

## 2015-02-01 ENCOUNTER — Encounter: Payer: Self-pay | Admitting: Thoracic Surgery (Cardiothoracic Vascular Surgery)

## 2015-02-01 ENCOUNTER — Encounter (HOSPITAL_COMMUNITY): Payer: Self-pay

## 2015-02-01 ENCOUNTER — Other Ambulatory Visit: Payer: Self-pay | Admitting: *Deleted

## 2015-02-01 ENCOUNTER — Institutional Professional Consult (permissible substitution) (INDEPENDENT_AMBULATORY_CARE_PROVIDER_SITE_OTHER): Payer: Medicare Other | Admitting: Thoracic Surgery (Cardiothoracic Vascular Surgery)

## 2015-02-01 VITALS — BP 115/66 | HR 103 | Resp 16 | Ht 59.0 in | Wt 109.0 lb

## 2015-02-01 VITALS — BP 101/60 | HR 89 | Temp 98.4°F | Resp 16 | Ht 59.0 in | Wt 113.1 lb

## 2015-02-01 DIAGNOSIS — R918 Other nonspecific abnormal finding of lung field: Secondary | ICD-10-CM

## 2015-02-01 DIAGNOSIS — Z853 Personal history of malignant neoplasm of breast: Secondary | ICD-10-CM | POA: Diagnosis not present

## 2015-02-01 DIAGNOSIS — Z8542 Personal history of malignant neoplasm of other parts of uterus: Secondary | ICD-10-CM | POA: Diagnosis not present

## 2015-02-01 DIAGNOSIS — J939 Pneumothorax, unspecified: Secondary | ICD-10-CM | POA: Diagnosis not present

## 2015-02-01 HISTORY — DX: Other nonspecific abnormal finding of lung field: R91.8

## 2015-02-01 HISTORY — DX: Pneumonia, unspecified organism: J18.9

## 2015-02-01 LAB — COMPREHENSIVE METABOLIC PANEL
ALK PHOS: 64 U/L (ref 38–126)
ALT: 16 U/L (ref 14–54)
ANION GAP: 6 (ref 5–15)
AST: 22 U/L (ref 15–41)
Albumin: 3 g/dL — ABNORMAL LOW (ref 3.5–5.0)
BUN: 18 mg/dL (ref 6–20)
CALCIUM: 9.7 mg/dL (ref 8.9–10.3)
CO2: 28 mmol/L (ref 22–32)
Chloride: 106 mmol/L (ref 101–111)
Creatinine, Ser: 0.59 mg/dL (ref 0.44–1.00)
Glucose, Bld: 90 mg/dL (ref 65–99)
Potassium: 3.9 mmol/L (ref 3.5–5.1)
SODIUM: 140 mmol/L (ref 135–145)
TOTAL PROTEIN: 5.8 g/dL — AB (ref 6.5–8.1)
Total Bilirubin: 0.3 mg/dL (ref 0.3–1.2)

## 2015-02-01 LAB — CBC
HCT: 31.1 % — ABNORMAL LOW (ref 36.0–46.0)
HEMOGLOBIN: 10.1 g/dL — AB (ref 12.0–15.0)
MCH: 35.1 pg — AB (ref 26.0–34.0)
MCHC: 32.5 g/dL (ref 30.0–36.0)
MCV: 108 fL — AB (ref 78.0–100.0)
Platelets: 250 10*3/uL (ref 150–400)
RBC: 2.88 MIL/uL — ABNORMAL LOW (ref 3.87–5.11)
RDW: 15.3 % (ref 11.5–15.5)
WBC: 7.2 10*3/uL (ref 4.0–10.5)

## 2015-02-01 NOTE — Pre-Procedure Instructions (Addendum)
Alejandra Callahan  [birth year]      [id number] - [place], [place] - [address] AT [place]. [name] [address] Phone: [phone] Fax: [phone]    Your procedure is scheduled on 02/04/15  Report to [facility] Admitting at 530 A.M.  Call this number if you have problems the morning of surgery:  [phone]   Remember:  Do not eat food or drink liquids after midnight.  Take these medicines the morning of surgery with A SIP OF WATER prednisone  STOP all herbel meds, nsaids (aleve,naproxen,advil,ibuprofen) starting TODAY including all vitamins(calcium,vit D, folic acid,multi)methotrexate,   Do not wear jewelry, make-up or nail polish.  Do not wear lotions, powders, or perfumes.  You may wear deodorant.  Do not shave 48 hours prior to surgery.  Men may shave face and neck.  Do not bring valuables to the hospital.  [facility] is not responsible for any belongings or valuables.  Contacts, dentures or bridgework may not be worn into surgery.  Leave your suitcase in the car.  After surgery it may be brought to your room.  For patients admitted to the hospital, discharge time will be determined by your treatment team.  Patients discharged the day of surgery will not be allowed to drive home.   Name and phone number of your driver:   Special instructions:  Special Instructions: [facility] - Preparing for Surgery  Before surgery, you can play an important role.  Because skin is not sterile, your skin needs to be as free of germs as possible.  You can reduce the number of germs on you skin by washing with CHG (chlorahexidine gluconate) soap before surgery.  CHG is an antiseptic cleaner which kills germs and bonds with the skin to continue killing germs even after washing.  Please DO NOT use if you have an allergy to CHG or antibacterial soaps.  If your skin becomes reddened/irritated stop using the CHG and  inform your nurse when you arrive at Short Stay.  Do not shave (including legs and underarms) for at least 48 hours prior to the first CHG shower.  You may shave your face.  Please follow these instructions carefully:   1.  Shower with CHG Soap the night before surgery and the morning of Surgery.  2.  If you choose to wash your hair, wash your hair first as usual with your normal shampoo.  3.  After you shampoo, rinse your hair and body thoroughly to remove the Shampoo.  4.  Use CHG as you would any other liquid soap.  You can apply chg directly  to the skin and wash gently with scrungie or a clean washcloth.  5.  Apply the CHG Soap to your body ONLY FROM THE NECK DOWN.  Do not use on open wounds or open sores.  Avoid contact with your eyes ears, mouth and genitals (private parts).  Wash genitals (private parts)       with your normal soap.  6.  Wash thoroughly, paying special attention to the area where your surgery will be performed.  7.  Thoroughly rinse your body with warm water from the neck down.  8.  DO NOT shower/wash with your normal soap after using and rinsing off the CHG Soap.  9.  Pat yourself dry with a clean towel.            10.  Wear clean pajamas.            11.  Place clean sheets on your bed the night of your first shower and do not sleep with pets.  Day of Surgery  Do not apply any lotions/deodorants the morning of surgery.  Please wear clean clothes to the hospital/surgery center.  Please read over the following fact sheets that you were given. Pain Booklet, Coughing and Deep Breathing and Surgical Site Infection Prevention

## 2015-02-01 NOTE — Progress Notes (Signed)
PCP is [name], MD Referring Provider is Whitney Muse, Kelby Fam, MD  Chief Complaint  Patient presents with  . Lung Mass    LUL.Marland KitchenNEEDLE BX done 01/18/15, PET 01/14/15, SUPER-D CT CHEST 01/24/15    HPI: 70 year old woman with a complex medical history including rheumatoid arthritis, breast cancer in 1982, endometrial cancer treated in 2014 with a recurrence with lung metastases in the summer of 2016. She was doing well until the summer of 2016. In June she noted a significant cough. She was evaluated for that and found to have multiple bilateral lung metastases (9 in all). She was treated with carboplatin and Taxol with a good response. However on a PET CT done in December there was a marked increase in size of a left lingular mass. She was treated with antibiotics and had a follow-up PET in January which showed a marked increase in size of 2 adjacent masses in the left upper lobe. A CT-guided needle biopsy was done, but showed only necrosis.  She complains of decreased energy. She denies shortness of breath or wheezing. She has a dry cough. She denies hemoptysis. She has been having some left upper back and shoulder pain, which she attributed to her rheumatoid arthritis.. She has not had any significant weight loss. She is very anxious about the findings on her recent scans. She also has been dealing with her husband's health issues, as he has liver failure.  Zubrod Score: At the time of surgery this patient's most appropriate activity status/level should be described as: '[x]'$     0    Normal activity, no symptoms '[]'$     1    Restricted in physical strenuous activity but ambulatory, able to do out light work '[]'$     2    Ambulatory and capable of self care, unable to do work activities, up and about >50 % of waking hours                              '[]'$     3    Only limited self care, in bed greater than 50% of waking hours '[]'$     4    Completely disabled, no self care, confined to bed or chair '[]'$     5     Moribund    Past Medical History  Diagnosis Date  . Cancer (HCC)     breast  . Arthritis     Rheumatoid arthritis  . PONV (postoperative nausea and vomiting) 1982  . Breast cancer ([place]) 09/1980    left unilateral mastectomy, no chemo or radiatioin  . Endometrial cancer ([place]) 2014    Past Surgical History  Procedure Laterality Date  . Mastectomy Left 09/1980  . Tubal ligation  April 1982  . Dilatation & currettage/hysteroscopy with resectocope N/A 11/18/2012    Procedure: DILATATION & CURETTAGE/HYSTEROSCOPY WITH RESECTOCOPE;  Surgeon: Marvene Staff, MD;  Location: [facility];  Service: Gynecology;  Laterality: N/A;  . Breast biopsy Left 09/11/80  . Vaginal hysterectomy  12/09/2012    Complete hysterectomy [facility]  . Portacath placement Right     Family History  Problem Relation Age of Onset  . Breast cancer Mother 10  . Heart attack Mother 105  . Colon cancer Father 3  . Heart attack Father 75  . Breast cancer Sister 50  . Heart attack Paternal Grandfather 65  . Breast cancer Other     mother's paternal first cousin dx  in late 40s-early 76s    Social History Social History  Substance Use Topics  . Smoking status: Never Smoker   . Smokeless tobacco: Never Used  . Alcohol Use: No    Current Outpatient Prescriptions  Medication Sig Dispense Refill  . alendronate (FOSAMAX) 70 MG tablet Take 70 mg by mouth once a week. Take with a full glass of water on an empty stomach. Monday    . Calcium-Vitamin D (CALTRATE 600 PLUS-VIT D PO) Take 1 each by mouth 2 (two) times daily.     . cholecalciferol (VITAMIN D) 1000 UNITS tablet Take 1,000 Units by mouth daily.    . folic acid (FOLVITE) 315 MCG tablet Take 800 mcg by mouth daily.     . methotrexate (RHEUMATREX) 2.5 MG tablet Take 2.5 mg by mouth 2 (two) times a week. Caution:Chemotherapy. Protect from light.  Pt to take 4 tablets on Friday and 4 tablets on Saturday    . Multiple Vitamin (MULTIVITAMIN) tablet Take 1  tablet by mouth daily.    . naproxen sodium (ANAPROX) 220 MG tablet Take 220 mg by mouth 2 (two) times daily with a meal.    . predniSONE (DELTASONE) 5 MG tablet Take 5 mg by mouth daily as needed (flare up).     . raloxifene (EVISTA) 60 MG tablet Take 60 mg by mouth daily.    Marland Kitchen lidocaine-prilocaine (EMLA) cream Apply a quarter size amount to port site 1 hour prior to chemo. Do not rub in. Cover with plastic wrap. (Patient not taking: Reported on 02/01/2015) 30 g 3   No current facility-administered medications for this visit.    Allergies  Allergen Reactions  . Codeine Other (See Comments)    Blurred vision and dizziness blurred vision, dizzy, loss of hearing  . Acyclovir And Related Swelling, Dermatitis and Rash    fatigue  . Other     TB test- arm swells/fever to extremity  Tegaderm badage- pulls skin off, rash  . Tuberculin Ppd Other (See Comments)    Swelling and redness in arm  . Tylox [Oxycodone-Acetaminophen] Other (See Comments)    Blurred vision and dizziness Blurred vision, dizziness    Review of Systems  Constitutional: Positive for fatigue. Negative for appetite change and unexpected weight change.  Eyes: Negative for visual disturbance.  Respiratory: Positive for cough. Negative for shortness of breath and wheezing.   Cardiovascular: Negative for chest pain and leg swelling.  Gastrointestinal: Positive for constipation. Negative for abdominal pain and blood in stool.  Genitourinary: Negative for dysuria and difficulty urinating.  Musculoskeletal: Positive for joint swelling and arthralgias.       Recently pain and left upper back and left shoulder  Skin:       Varicose veins  Neurological: Positive for numbness. Negative for syncope.  Hematological: Negative for adenopathy. Does not bruise/bleed easily.  All other systems reviewed and are negative.   BP 115/66 mmHg  Pulse 103  Resp 16  Ht '4\' 11"'$  (1.499 m)  Wt 109 lb (49.442 kg)  BMI 22.00 kg/m2  SpO2  94% Physical Exam  Constitutional: She is oriented to person, place, and time. She appears well-developed and well-nourished. No distress.  HENT:  Head: Normocephalic and atraumatic.  Eyes: Conjunctivae and EOM are normal. No scleral icterus.  Neck: Neck supple. No thyromegaly present.  Cardiovascular: Normal rate, regular rhythm, normal heart sounds and intact distal pulses.  Exam reveals no gallop and no friction rub.   No murmur heard. Pulmonary/Chest: Effort normal  and breath sounds normal. No respiratory distress. She has no wheezes. She has no rales.  Abdominal: Soft. Bowel sounds are normal. She exhibits no distension. There is no tenderness.  Musculoskeletal: She exhibits no edema.  RA changes  Lymphadenopathy:    She has no cervical adenopathy.  Neurological: She is alert and oriented to person, place, and time. No cranial nerve deficit. She exhibits normal muscle tone.  Skin: Skin is warm and dry.  Vitals reviewed.    Diagnostic Tests: CT CHEST WITH CONTRAST  TECHNIQUE: Multidetector CT imaging of the chest was performed using thin slice collimation for electromagnetic bronchoscopy planning purposes, with intravenous contrast.  CONTRAST: 48m OMNIPAQUE IOHEXOL 300 MG/ML SOLN  COMPARISON: PET-CT 01/14/2015  FINDINGS: Mediastinum/Lymph Nodes: No axillary or supraclavicular lymphadenopathy. Port in RIGHT chest wall. No new mediastinal adenopathy. No pericardial fluid. Esophagus mildly thickened distally. Small of fluid esophagus. The central pulmonary embolism.  Lungs/Pleura: Large of pulmonary mass is abutting the pericardial surface is again demonstrated. The more superior mass measures 7.0 x 4.6 increased from 5.8 by 4.2 cm. More inferior mass adjacent to the LEFT cardiac border measures 5.2 by 3.2 cm increased from 3.3 by 2.3 cm. Lesion position between the 2 dominant nodules is also increased (image 31, series 3)  Trace bilateral pleural effusions.  No specific pulmonary nodularity.  The upper abdomen is normal adrenal glands. Tiny hypodense lesion liver on image 46, series 2 is likely benign.  Review of the skeleton is unremarkable.  IMPRESSION: 1. Significant short interval enlargement of pulmonary masses in the LEFT hemithorax along the LEFT mediastinal / cardiac border consistent with progression of metastatic process. 2. No mediastinal adenopathy. No additional pulmonary parenchymal lesions. These results will be called to the ordering clinician or representative by the Radiologist Assistant, and communication documented in the PACS or zVision Dashboard.   Electronically Signed  By: SSuzy BouchardM.D.  On: 01/24/2015 17:29      NUCLEAR MEDICINE PET SKULL BASE TO THIGH  TECHNIQUE: 12.3 mCi F-18 FDG was injected intravenously. Full-ring PET imaging was performed from the skull base to thigh after the radiotracer. CT data was obtained and used for attenuation correction and anatomic localization.  FASTING BLOOD GLUCOSE: Value: 81 mg/dl  COMPARISON: 12/06/2014 PET-CT.  FINDINGS: NECK  No hypermetabolic lymph nodes in the neck.  CHEST  There is an irregular hypermetabolic anterior medial left upper lobe lung mass (series 3/image 81) with max SUV 15.0, which has significantly increased in size and metabolism, previously 3.6 x 2.8 cm with max SUV 9.0. There is new hypermetabolism with associated pleural thickening within the anterior left pleural space with max SUV 4.6, with associated small layering left pleural effusion, in keeping with a malignant left pleural effusion.  There are 3 subcentimeter right upper lobe pulmonary nodules measuring up to 0.8 cm in size (series 3/image 60), which are unchanged in size and demonstrate no significant metabolism.  There are newly mildly hypermetabolic subcentimeter left mediastinal lymph nodes, including a 0.7 cm AP window node (series 3/image  65) with max SUV 2.6, a 0.7 cm prevascular node (series 3/image 65) with max SUV 2.3 and a 0.7 cm upper left paratracheal node (series 3/image 53) with max SUV 2.3.  No hypermetabolic axillary nodes. Left axillary nodal dissection clips are again noted. Stable appearance of the chest wall status post left mastectomy. No hypermetabolic superficial chest wall masses. Right internal jugular MediPort terminates at the cavoatrial junction.  ABDOMEN/PELVIS  No abnormal hypermetabolic activity within the liver,  pancreas, adrenal glands, or spleen. No hypermetabolic lymph nodes in the abdomen or pelvis. Stable non hypermetabolic 2.2 cm inferior right liver lobe cyst. Hysterectomy.  SKELETON  No focal hypermetabolic activity to suggest skeletal metastasis. Previously described reactive marrow uptake has resolved.  IMPRESSION: 1. Significant interval progression of hypermetabolic left upper lobe lung mass. 2. New small malignant left pleural effusion with associated left pleural thickening and hypermetabolism anteriorly adjacent to the progressive left upper lobe lung mass. 3. Newly mildly hypermetabolic left mediastinal lymphadenopathy, likely representing mediastinal nodal metastases. 4. Right pulmonary nodules are stable and non hypermetabolic. 5. No hypermetabolic metastatic disease in the neck, abdomen or pelvis. No hypermetabolic bone metastases.   Electronically Signed  By: Ilona Sorrel M.D.  On: 01/14/2015 13:06 I personally reviewed the CT chest and PET CTs dating back to October and concur with the findings as noted above. She had multiple bilateral lung metastases. The majority of these have responded to treatment, but 2 masses are present in the left upper lobe and a rapidly growing. She also has what appears to be new mediastinal adenopathy on the most recent PET.  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 She has been treated for this with chemotherapy with an excellent response for the most part. However, there are 2 masses in the lingula adjacent to the heart which have grown rapidly over the past couple of months. A CT-guided needle biopsy was negative showing only necrosis. That is not surprising given the rapid growth of these masses.  I recommended to Mrs. Bermingham that we proceed with a navigational bronchoscopy for diagnostic purposes. Dr. Whitney Muse was kind enough to do her recent CT with a super dimension protocol, so that is already taking care of.  I described the procedure to her. She understands that this would be an endoscopic procedure done in the operating room under general anesthesia. We would plan to do this on an outpatient basis. We would use general anesthesia to improve diagnostic yield both by improving the results with navigation as well as allowing [place] to take more biopsies than might be possible with an awake bronchoscopy. I reviewed the indications, risks, benefits, and alternatives. She understands the risks include, but are not limited to death, MI, DVT, PE, stroke, bleeding, pneumothorax, failure to make a diagnosis, as well as the possibility of other unforeseeable complications.  She accepts the risks and agrees to proceed.  Plan: Navigational bronchoscopy on Monday, 02/04/2015  [name], MD [facility] [phone]

## 2015-02-03 ENCOUNTER — Encounter (HOSPITAL_COMMUNITY): Payer: Self-pay | Admitting: Anesthesiology

## 2015-02-04 ENCOUNTER — Ambulatory Visit (HOSPITAL_COMMUNITY): Payer: Medicare Other | Admitting: Anesthesiology

## 2015-02-04 ENCOUNTER — Encounter (HOSPITAL_COMMUNITY)
Admission: RE | Disposition: A | Discharge status: Home / Self Care | Payer: Self-pay | Source: Ambulatory Visit | Attending: Thoracic Surgery (Cardiothoracic Vascular Surgery)

## 2015-02-04 ENCOUNTER — Ambulatory Visit (HOSPITAL_COMMUNITY): Payer: Medicare Other

## 2015-02-04 ENCOUNTER — Observation Stay (HOSPITAL_COMMUNITY): Payer: Medicare Other

## 2015-02-04 ENCOUNTER — Observation Stay (HOSPITAL_COMMUNITY)
Admission: RE | Admit: 2015-02-04 | Discharge: 2015-02-05 | Disposition: A | Discharge status: Home / Self Care | Payer: Medicare Other | Source: Ambulatory Visit | Attending: Thoracic Surgery (Cardiothoracic Vascular Surgery) | Admitting: Thoracic Surgery (Cardiothoracic Vascular Surgery)

## 2015-02-04 DIAGNOSIS — Z90710 Acquired absence of both cervix and uterus: Secondary | ICD-10-CM | POA: Insufficient documentation

## 2015-02-04 DIAGNOSIS — J95811 Postprocedural pneumothorax: Secondary | ICD-10-CM | POA: Insufficient documentation

## 2015-02-04 DIAGNOSIS — C7801 Secondary malignant neoplasm of right lung: Secondary | ICD-10-CM

## 2015-02-04 DIAGNOSIS — Z853 Personal history of malignant neoplasm of breast: Secondary | ICD-10-CM

## 2015-02-04 DIAGNOSIS — J9382 Other air leak: Secondary | ICD-10-CM | POA: Diagnosis not present

## 2015-02-04 DIAGNOSIS — Z419 Encounter for procedure for purposes other than remedying health state, unspecified: Secondary | ICD-10-CM

## 2015-02-04 DIAGNOSIS — Z9012 Acquired absence of left breast and nipple: Secondary | ICD-10-CM

## 2015-02-04 DIAGNOSIS — Y848 Other medical procedures as the cause of abnormal reaction of the patient, or of later complication, without mention of misadventure at the time of the procedure: Secondary | ICD-10-CM

## 2015-02-04 DIAGNOSIS — Z888 Allergy status to other drugs, medicaments and biological substances status: Secondary | ICD-10-CM

## 2015-02-04 DIAGNOSIS — D630 Anemia in neoplastic disease: Secondary | ICD-10-CM | POA: Diagnosis present

## 2015-02-04 DIAGNOSIS — R918 Other nonspecific abnormal finding of lung field: Secondary | ICD-10-CM | POA: Insufficient documentation

## 2015-02-04 DIAGNOSIS — C7802 Secondary malignant neoplasm of left lung: Secondary | ICD-10-CM | POA: Insufficient documentation

## 2015-02-04 DIAGNOSIS — T451X5A Adverse effect of antineoplastic and immunosuppressive drugs, initial encounter: Secondary | ICD-10-CM | POA: Diagnosis present

## 2015-02-04 DIAGNOSIS — Z79899 Other long term (current) drug therapy: Secondary | ICD-10-CM

## 2015-02-04 DIAGNOSIS — J9383 Other pneumothorax: Secondary | ICD-10-CM

## 2015-02-04 DIAGNOSIS — D509 Iron deficiency anemia, unspecified: Secondary | ICD-10-CM | POA: Diagnosis present

## 2015-02-04 DIAGNOSIS — Z8542 Personal history of malignant neoplasm of other parts of uterus: Secondary | ICD-10-CM | POA: Insufficient documentation

## 2015-02-04 DIAGNOSIS — Z7983 Long term (current) use of bisphosphonates: Secondary | ICD-10-CM

## 2015-02-04 DIAGNOSIS — J9811 Atelectasis: Secondary | ICD-10-CM | POA: Diagnosis present

## 2015-02-04 DIAGNOSIS — Z7952 Long term (current) use of systemic steroids: Secondary | ICD-10-CM | POA: Insufficient documentation

## 2015-02-04 DIAGNOSIS — J910 Malignant pleural effusion: Secondary | ICD-10-CM | POA: Diagnosis present

## 2015-02-04 DIAGNOSIS — Z885 Allergy status to narcotic agent status: Secondary | ICD-10-CM

## 2015-02-04 DIAGNOSIS — M069 Rheumatoid arthritis, unspecified: Secondary | ICD-10-CM | POA: Diagnosis present

## 2015-02-04 DIAGNOSIS — Z79810 Long term (current) use of selective estrogen receptor modulators (SERMs): Secondary | ICD-10-CM | POA: Insufficient documentation

## 2015-02-04 DIAGNOSIS — Y92530 Ambulatory surgery center as the place of occurrence of the external cause: Secondary | ICD-10-CM

## 2015-02-04 DIAGNOSIS — J9601 Acute respiratory failure with hypoxia: Secondary | ICD-10-CM | POA: Diagnosis present

## 2015-02-04 DIAGNOSIS — R222 Localized swelling, mass and lump, trunk: Secondary | ICD-10-CM

## 2015-02-04 DIAGNOSIS — J939 Pneumothorax, unspecified: Principal | ICD-10-CM | POA: Diagnosis present

## 2015-02-04 DIAGNOSIS — Z791 Long term (current) use of non-steroidal anti-inflammatories (NSAID): Secondary | ICD-10-CM

## 2015-02-04 DIAGNOSIS — Z515 Encounter for palliative care: Secondary | ICD-10-CM | POA: Diagnosis present

## 2015-02-04 DIAGNOSIS — E871 Hypo-osmolality and hyponatremia: Secondary | ICD-10-CM | POA: Diagnosis present

## 2015-02-04 DIAGNOSIS — C541 Malignant neoplasm of endometrium: Secondary | ICD-10-CM | POA: Diagnosis present

## 2015-02-04 DIAGNOSIS — Z9221 Personal history of antineoplastic chemotherapy: Secondary | ICD-10-CM | POA: Insufficient documentation

## 2015-02-04 HISTORY — PX: VIDEO BRONCHOSCOPY WITH ENDOBRONCHIAL NAVIGATION: SHX6175

## 2015-02-04 LAB — PROTIME-INR
INR: 1.09 (ref 0.00–1.49)
Prothrombin Time: 14.3 seconds (ref 11.6–15.2)

## 2015-02-04 LAB — APTT: aPTT: 25 seconds (ref 24–37)

## 2015-02-04 SURGERY — VIDEO BRONCHOSCOPY WITH ENDOBRONCHIAL NAVIGATION
Anesthesia: General

## 2015-02-04 MED ORDER — RALOXIFENE HCL 60 MG PO TABS
60.0000 mg | ORAL_TABLET | Freq: Every day | ORAL | Status: DC
  Administered 2015-02-04 – 2015-02-05 (×2): 60 mg via ORAL
  Filled 2015-02-04 (×2): qty 1

## 2015-02-04 MED ORDER — PHENYLEPHRINE 40 MCG/ML (10ML) SYRINGE FOR IV PUSH (FOR BLOOD PRESSURE SUPPORT)
PREFILLED_SYRINGE | INTRAVENOUS | Status: AC
  Filled 2015-02-04: qty 10

## 2015-02-04 MED ORDER — GLYCOPYRROLATE 0.2 MG/ML IJ SOLN
INTRAMUSCULAR | Status: AC
  Filled 2015-02-04: qty 1

## 2015-02-04 MED ORDER — SUGAMMADEX SODIUM 200 MG/2ML IV SOLN
INTRAVENOUS | Status: DC | PRN
  Administered 2015-02-04: 102.6 mg via INTRAVENOUS

## 2015-02-04 MED ORDER — LIDOCAINE HCL (CARDIAC) 20 MG/ML IV SOLN
INTRAVENOUS | Status: AC
  Filled 2015-02-04: qty 5

## 2015-02-04 MED ORDER — FENTANYL CITRATE (PF) 250 MCG/5ML IJ SOLN
INTRAMUSCULAR | Status: AC
  Filled 2015-02-04: qty 5

## 2015-02-04 MED ORDER — SUGAMMADEX SODIUM 200 MG/2ML IV SOLN
INTRAVENOUS | Status: AC
  Filled 2015-02-04: qty 2

## 2015-02-04 MED ORDER — GI COCKTAIL ~~LOC~~: 30.0000 mL | Freq: Three times a day (TID) | ORAL | Status: DC | PRN

## 2015-02-04 MED ORDER — SUCCINYLCHOLINE CHLORIDE 20 MG/ML IJ SOLN
INTRAMUSCULAR | Status: AC
  Filled 2015-02-04: qty 1

## 2015-02-04 MED ORDER — ACETAMINOPHEN 325 MG PO TABS: 650.0000 mg | ORAL_TABLET | Freq: Four times a day (QID) | ORAL | Status: DC | PRN

## 2015-02-04 MED ORDER — METHOTREXATE 2.5 MG PO TABS: 10.0000 mg | ORAL_TABLET | ORAL | Status: DC

## 2015-02-04 MED ORDER — SODIUM CHLORIDE 0.9 % IV SOLN
INTRAVENOUS | Status: DC
  Administered 2015-02-04: 15:00:00 via INTRAVENOUS

## 2015-02-04 MED ORDER — ROCURONIUM BROMIDE 100 MG/10ML IV SOLN
INTRAVENOUS | Status: DC | PRN
  Administered 2015-02-04: 10 mg via INTRAVENOUS
  Administered 2015-02-04: 40 mg via INTRAVENOUS

## 2015-02-04 MED ORDER — TRAMADOL HCL 50 MG PO TABS: 50.0000 mg | ORAL_TABLET | Freq: Four times a day (QID) | ORAL | Status: DC | PRN

## 2015-02-04 MED ORDER — ONDANSETRON HCL 4 MG/2ML IJ SOLN
INTRAMUSCULAR | Status: AC
  Filled 2015-02-04: qty 2

## 2015-02-04 MED ORDER — ALENDRONATE SODIUM 70 MG PO TABS: 70.0000 mg | ORAL_TABLET | ORAL | Status: DC

## 2015-02-04 MED ORDER — LACTATED RINGERS IV SOLN
INTRAVENOUS | Status: DC | PRN
  Administered 2015-02-04: 07:00:00 via INTRAVENOUS

## 2015-02-04 MED ORDER — ROCURONIUM BROMIDE 50 MG/5ML IV SOLN
INTRAVENOUS | Status: AC
  Filled 2015-02-04: qty 1

## 2015-02-04 MED ORDER — MIDAZOLAM HCL 5 MG/5ML IJ SOLN
INTRAMUSCULAR | Status: DC | PRN
  Administered 2015-02-04: 2 mg via INTRAVENOUS

## 2015-02-04 MED ORDER — LIDOCAINE HCL (PF) 1 % IJ SOLN
0.0000 mL | Freq: Once | INTRAMUSCULAR | Status: DC | PRN
  Filled 2015-02-04: qty 30

## 2015-02-04 MED ORDER — VITAMIN D 1000 UNITS PO TABS
1000.0000 [IU] | ORAL_TABLET | Freq: Every day | ORAL | Status: DC
  Administered 2015-02-04 – 2015-02-05 (×2): 1000 [IU] via ORAL
  Filled 2015-02-04 (×2): qty 1

## 2015-02-04 MED ORDER — NAPROXEN 250 MG PO TABS
250.0000 mg | ORAL_TABLET | Freq: Two times a day (BID) | ORAL | Status: DC
  Administered 2015-02-04 – 2015-02-05 (×2): 250 mg via ORAL
  Filled 2015-02-04 (×2): qty 1

## 2015-02-04 MED ORDER — PREDNISONE 5 MG PO TABS
5.0000 mg | ORAL_TABLET | Freq: Every day | ORAL | Status: DC | PRN
  Administered 2015-02-05: 5 mg via ORAL
  Filled 2015-02-04: qty 1

## 2015-02-04 MED ORDER — ADULT MULTIVITAMIN W/MINERALS CH
1.0000 | ORAL_TABLET | Freq: Every day | ORAL | Status: DC
  Administered 2015-02-04 – 2015-02-05 (×2): 1 via ORAL
  Filled 2015-02-04 (×2): qty 1

## 2015-02-04 MED ORDER — ZOLPIDEM TARTRATE 5 MG PO TABS: 5.0000 mg | ORAL_TABLET | Freq: Every evening | ORAL | Status: DC | PRN

## 2015-02-04 MED ORDER — TRAMADOL HCL 50 MG PO TABS: 100.0000 mg | ORAL_TABLET | Freq: Four times a day (QID) | ORAL | Status: DC | PRN

## 2015-02-04 MED ORDER — FENTANYL CITRATE (PF) 100 MCG/2ML IJ SOLN: 25.0000 ug | INTRAMUSCULAR | Status: DC | PRN

## 2015-02-04 MED ORDER — 0.9 % SODIUM CHLORIDE (POUR BTL) OPTIME
TOPICAL | Status: DC | PRN
  Administered 2015-02-04: 1000 mL

## 2015-02-04 MED ORDER — FOLIC ACID 1 MG PO TABS
1.0000 mg | ORAL_TABLET | Freq: Every day | ORAL | Status: DC
  Administered 2015-02-04 – 2015-02-05 (×2): 1 mg via ORAL
  Filled 2015-02-04 (×2): qty 1

## 2015-02-04 MED ORDER — ONDANSETRON HCL 4 MG/2ML IJ SOLN
INTRAMUSCULAR | Status: DC | PRN
  Administered 2015-02-04: 4 mg via INTRAVENOUS

## 2015-02-04 MED ORDER — ONDANSETRON HCL 4 MG/2ML IJ SOLN: 4.0000 mg | Freq: Once | INTRAMUSCULAR | Status: DC | PRN

## 2015-02-04 MED ORDER — MIDAZOLAM HCL 2 MG/2ML IJ SOLN
INTRAMUSCULAR | Status: AC
  Filled 2015-02-04: qty 2

## 2015-02-04 MED ORDER — FENTANYL CITRATE (PF) 100 MCG/2ML IJ SOLN
INTRAMUSCULAR | Status: AC
  Filled 2015-02-04: qty 2

## 2015-02-04 MED ORDER — LIDOCAINE HCL (CARDIAC) 20 MG/ML IV SOLN
INTRAVENOUS | Status: DC | PRN
  Administered 2015-02-04: 50 mg via INTRAVENOUS

## 2015-02-04 MED ORDER — FENTANYL CITRATE (PF) 100 MCG/2ML IJ SOLN
INTRAMUSCULAR | Status: DC | PRN
  Administered 2015-02-04: 50 ug via INTRAVENOUS
  Administered 2015-02-04: 100 ug via INTRAVENOUS

## 2015-02-04 MED ORDER — PHENYLEPHRINE HCL 10 MG/ML IJ SOLN
INTRAMUSCULAR | Status: DC | PRN
  Administered 2015-02-04: 80 ug via INTRAVENOUS

## 2015-02-04 MED ORDER — EPHEDRINE SULFATE 50 MG/ML IJ SOLN
INTRAMUSCULAR | Status: AC
  Filled 2015-02-04: qty 1

## 2015-02-04 MED ORDER — EPINEPHRINE HCL 1 MG/ML IJ SOLN
INTRAMUSCULAR | Status: AC
  Filled 2015-02-04: qty 1

## 2015-02-04 MED ORDER — PROPOFOL 10 MG/ML IV BOLUS
INTRAVENOUS | Status: DC | PRN
  Administered 2015-02-04: 150 mg via INTRAVENOUS

## 2015-02-04 SURGICAL SUPPLY — 40 items
ADAPTER BRONCH F/PENTAX (ADAPTER) ×3 IMPLANT
ADPR BSCP EDG PNTX (ADAPTER) ×1
BRUSH SUPERTRAX BIOPSY (INSTRUMENTS) IMPLANT
BRUSH SUPERTRAX NDL-TIP CYTO (INSTRUMENTS) ×2 IMPLANT
CANISTER SUCTION 2500CC (MISCELLANEOUS) ×3 IMPLANT
CHANNEL WORK EXTEND EDGE 180 (KITS) IMPLANT
CHANNEL WORK EXTEND EDGE 45 (KITS) IMPLANT
CHANNEL WORK EXTEND EDGE 90 (KITS) IMPLANT
CONT SPEC 4OZ CLIKSEAL STRL BL (MISCELLANEOUS) ×14 IMPLANT
COVER TABLE BACK 60X90 (DRAPES) ×3 IMPLANT
FILTER STRAW FLUID ASPIR (MISCELLANEOUS) IMPLANT
FORCEPS BIOP SUPERTRX PREMAR (INSTRUMENTS) ×2 IMPLANT
GAUZE SPONGE 4X4 12PLY STRL (GAUZE/BANDAGES/DRESSINGS) ×3 IMPLANT
GLOVE SURG SIGNA 7.5 PF LTX (GLOVE) ×3 IMPLANT
GLOVE SURG SS PI 7.0 STRL IVOR (GLOVE) ×2 IMPLANT
GOWN STRL REUS W/ TWL XL LVL3 (GOWN DISPOSABLE) ×1 IMPLANT
GOWN STRL REUS W/TWL XL LVL3 (GOWN DISPOSABLE) ×6
IV CATH 18G X1.75 CATHLON (IV SOLUTION) ×2 IMPLANT
KIT CLEAN ENDO COMPLIANCE (KITS) ×3 IMPLANT
KIT PROCEDURE EDGE 180 (KITS) IMPLANT
KIT PROCEDURE EDGE 45 (KITS) IMPLANT
KIT PROCEDURE EDGE 90 (KITS) ×2 IMPLANT
KIT ROOM TURNOVER OR (KITS) ×3 IMPLANT
MARKER SKIN DUAL TIP RULER LAB (MISCELLANEOUS) ×3 IMPLANT
NDL SUPERTRX PREMARK BIOPSY (NEEDLE) IMPLANT
NEEDLE SUPERTRX PREMARK BIOPSY (NEEDLE) ×3 IMPLANT
NS IRRIG 1000ML POUR BTL (IV SOLUTION) ×3 IMPLANT
OIL SILICONE PENTAX (PARTS (SERVICE/REPAIRS)) ×3 IMPLANT
PAD ARMBOARD 7.5X6 YLW CONV (MISCELLANEOUS) ×6 IMPLANT
PATCHES PATIENT (LABEL) ×9 IMPLANT
SYR 20CC LL (SYRINGE) ×3 IMPLANT
SYR 20ML ECCENTRIC (SYRINGE) ×3 IMPLANT
SYR 30ML LL (SYRINGE) ×3 IMPLANT
SYR 5ML LL (SYRINGE) ×3 IMPLANT
SYSTEM GENCUT CORE BIOPSY (NEEDLE) ×2 IMPLANT
TOWEL OR 17X24 6PK STRL BLUE (TOWEL DISPOSABLE) ×3 IMPLANT
TRAP SPECIMEN MUCOUS 40CC (MISCELLANEOUS) ×3 IMPLANT
TUBE CONNECTING 20'X1/4 (TUBING) ×2
TUBE CONNECTING 20X1/4 (TUBING) ×4 IMPLANT
UNDERPAD 30X30 (UNDERPADS AND DIAPERS) ×3 IMPLANT

## 2015-02-04 NOTE — Progress Notes (Signed)
      [address]       [place] [address]             [phone]      Postop chest xray shows a left pneumothorax.  She is breathing comfortably and is not in any distress  Will plan to repeat CXR at [facility] overnight for observation  Remo Lipps C. Roxan Hockey, MD [facility] [phone]

## 2015-02-04 NOTE — Anesthesia Procedure Notes (Signed)
Procedure Name: Intubation Date/Time: 02/04/2015 7:45 AM Performed by: Eligha Bridegroom Pre-anesthesia Checklist: Emergency Drugs available, Patient identified, Timeout performed, Suction available and Patient being monitored Patient Re-evaluated:Patient Re-evaluated prior to inductionOxygen Delivery Method: Circle system utilized Preoxygenation: Pre-oxygenation with 100% oxygen Intubation Type: IV induction Ventilation: Mask ventilation without difficulty and Oral airway inserted - appropriate to patient size Laryngoscope Size: 2, Mac and 3 Grade View: Grade II Tube type: Oral Tube size: 8.0 mm Number of attempts: 1 Airway Equipment and Method: LTA kit utilized and Stylet Placement Confirmation: ETT inserted through vocal cords under direct vision,  breath sounds checked- equal and bilateral and positive ETCO2 Secured at: 21 cm Tube secured with: Tape Dental Injury: Teeth and Oropharynx as per pre-operative assessment

## 2015-02-04 NOTE — Anesthesia Postprocedure Evaluation (Signed)
Anesthesia Post Note  Patient: Alejandra Callahan  Procedure(s) Performed: Procedure(s) (LRB): VIDEO BRONCHOSCOPY WITH ENDOBRONCHIAL NAVIGATION (N/A)  Patient location during evaluation: PACU Anesthesia Type: General Level of consciousness: awake and alert Pain management: pain level controlled Vital Signs Assessment: post-procedure vital signs reviewed and stable Respiratory status: spontaneous breathing, nonlabored ventilation, respiratory function stable and patient connected to nasal cannula oxygen Cardiovascular status: blood pressure returned to baseline and stable Postop Assessment: no signs of nausea or vomiting Anesthetic complications: no    Last Vitals:  Filed Vitals:   02/04/15 1330 02/04/15 1336  BP: 101/52   Pulse: 101   Temp:  36.6 C  Resp: 21     Last Pain: There were no vitals filed for this visit.               Catalina Gravel

## 2015-02-04 NOTE — Anesthesia Preprocedure Evaluation (Addendum)
Anesthesia Evaluation  Patient identified by MRN, date of birth, ID band Patient awake    Reviewed: Allergy & Precautions, NPO status , Patient's Chart, lab work & pertinent test results  History of Anesthesia Complications (+) PONV and history of anesthetic complications  Airway Mallampati: II  TM Distance: >3 FB Neck ROM: Full    Dental  (+) Teeth Intact, Dental Advisory Given   Pulmonary  Lung mass; chemo ended 11/16   Pulmonary exam normal breath sounds clear to auscultation       Cardiovascular (-) hypertension(-) CAD and (-) Past MI negative cardio ROS Normal cardiovascular exam Rhythm:Regular Rate:Normal     Neuro/Psych negative neurological ROS  negative psych ROS   GI/Hepatic negative GI ROS, Neg liver ROS,   Endo/Other  negative endocrine ROS  Renal/GU negative Renal ROS     Musculoskeletal  (+) Arthritis , Rheumatoid disorders,    Abdominal   Peds  Hematology  (+) Blood dyscrasia, anemia ,   Anesthesia Other Findings Day of surgery medications reviewed with the patient.  History of breast cancer, endometrial cancer  Reproductive/Obstetrics                            Anesthesia Physical Anesthesia Plan  ASA: III  Anesthesia Plan: General   Post-op Pain Management:    Induction: Intravenous  Airway Management Planned: Oral ETT  Additional Equipment:   Intra-op Plan:   Post-operative Plan: Extubation in OR  Informed Consent: I have reviewed the patients History and Physical, chart, labs and discussed the procedure including the risks, benefits and alternatives for the proposed anesthesia with the patient or authorized representative who has indicated his/her understanding and acceptance.   Dental advisory given  Plan Discussed with: CRNA  Anesthesia Plan Comments: (Risks/benefits of general anesthesia discussed with patient including risk of damage to teeth,  lips, gum, and tongue, nausea/vomiting, allergic reactions to medications, and the possibility of heart attack, stroke and death.  All patient questions answered.  Patient wishes to proceed.)        Anesthesia Quick Evaluation

## 2015-02-04 NOTE — Progress Notes (Signed)
PHARMACIST - PHYSICIAN COMMUNICATION  CONCERNING: P&T Medication Policy Regarding Oral Bisphosphonates  RECOMMENDATION: Your order for alendronate (Fosamax), ibandronate (Boniva), or risedronate (Actonel) has been discontinued at this time.  If the patient's post-hospital medical condition warrants safe use of this class of drugs, please resume the pre-hospital regimen upon discharge.  DESCRIPTION:  Alendronate (Fosamax), ibandronate (Boniva), and risedronate (Actonel) can cause severe esophageal erosions in patients who are unable to remain upright at least 30 minutes after taking this medication.   Since brief interruptions in therapy are thought to have minimal impact on bone mineral density, the [facility] has established that bisphosphonate orders should be routinely discontinued during hospitalization.   To override this safety policy and permit administration of Boniva, Fosamax, or Actonel in the hospital, prescribers must write "DO NOT HOLD" in the comments section when placing the order for this class of medications.    Gracy Bruins, PharmD Clinical Pharmacist [facility]

## 2015-02-04 NOTE — Interval H&P Note (Signed)
History and Physical Interval Note:  02/12/2180 8:83 AM  Alejandra Callahan  has presented today for surgery, with the diagnosis of left lung mass  The various methods of treatment have been discussed with the patient and family. After consideration of risks, benefits and other options for treatment, the patient has consented to  Procedure(s): [place] (N/A) as a surgical intervention .  The patient's history has been reviewed, patient examined, no change in status, stable for surgery.  I have reviewed the patient's chart and labs.  Questions were answered to the patient's satisfaction.     [name]

## 2015-02-04 NOTE — H&P (View-Only) (Signed)
PCP is [name], MD Referring Provider is Whitney Muse, Kelby Fam, MD  Chief Complaint  Patient presents with  . Lung Mass    LUL.Marland KitchenNEEDLE BX done 01/18/15, PET 01/14/15, SUPER-D CT CHEST 01/24/15    HPI: 70 year old woman with a complex medical history including rheumatoid arthritis, breast cancer in 1982, endometrial cancer treated in 2014 with a recurrence with lung metastases in the summer of 2016. She was doing well until the summer of 2016. In June she noted a significant cough. She was evaluated for that and found to have multiple bilateral lung metastases (9 in all). She was treated with carboplatin and Taxol with a good response. However on a PET CT done in December there was a marked increase in size of a left lingular mass. She was treated with antibiotics and had a follow-up PET in January which showed a marked increase in size of 2 adjacent masses in the left upper lobe. A CT-guided needle biopsy was done, but showed only necrosis.  She complains of decreased energy. She denies shortness of breath or wheezing. She has a dry cough. She denies hemoptysis. She has been having some left upper back and shoulder pain, which she attributed to her rheumatoid arthritis.. She has not had any significant weight loss. She is very anxious about the findings on her recent scans. She also has been dealing with her husband's health issues, as he has liver failure.  Zubrod Score: At the time of surgery this patient's most appropriate activity status/level should be described as: '[x]'$     0    Normal activity, no symptoms '[]'$     1    Restricted in physical strenuous activity but ambulatory, able to do out light work '[]'$     2    Ambulatory and capable of self care, unable to do work activities, up and about >50 % of waking hours                              '[]'$     3    Only limited self care, in bed greater than 50% of waking hours '[]'$     4    Completely disabled, no self care, confined to bed or chair '[]'$     5     Moribund    Past Medical History  Diagnosis Date  . Cancer (HCC)     breast  . Arthritis     Rheumatoid arthritis  . PONV (postoperative nausea and vomiting) 1982  . Breast cancer ([place]) 09/1980    left unilateral mastectomy, no chemo or radiatioin  . Endometrial cancer ([place]) 2014    Past Surgical History  Procedure Laterality Date  . Mastectomy Left 09/1980  . Tubal ligation  April 1982  . Dilatation & currettage/hysteroscopy with resectocope N/A 11/18/2012    Procedure: DILATATION & CURETTAGE/HYSTEROSCOPY WITH RESECTOCOPE;  Surgeon: Marvene Staff, MD;  Location: [facility];  Service: Gynecology;  Laterality: N/A;  . Breast biopsy Left 09/11/80  . Vaginal hysterectomy  12/09/2012    Complete hysterectomy [facility]  . Portacath placement Right     Family History  Problem Relation Age of Onset  . Breast cancer Mother 55  . Heart attack Mother 76  . Colon cancer Father 67  . Heart attack Father 59  . Breast cancer Sister 41  . Heart attack Paternal Grandfather 75  . Breast cancer Other     mother's paternal first cousin dx  in late 40s-early 48s    Social History Social History  Substance Use Topics  . Smoking status: Never Smoker   . Smokeless tobacco: Never Used  . Alcohol Use: No    Current Outpatient Prescriptions  Medication Sig Dispense Refill  . alendronate (FOSAMAX) 70 MG tablet Take 70 mg by mouth once a week. Take with a full glass of water on an empty stomach. Monday    . Calcium-Vitamin D (CALTRATE 600 PLUS-VIT D PO) Take 1 each by mouth 2 (two) times daily.     . cholecalciferol (VITAMIN D) 1000 UNITS tablet Take 1,000 Units by mouth daily.    . folic acid (FOLVITE) 867 MCG tablet Take 800 mcg by mouth daily.     . methotrexate (RHEUMATREX) 2.5 MG tablet Take 2.5 mg by mouth 2 (two) times a week. Caution:Chemotherapy. Protect from light.  Pt to take 4 tablets on Friday and 4 tablets on Saturday    . Multiple Vitamin (MULTIVITAMIN) tablet Take 1  tablet by mouth daily.    . naproxen sodium (ANAPROX) 220 MG tablet Take 220 mg by mouth 2 (two) times daily with a meal.    . predniSONE (DELTASONE) 5 MG tablet Take 5 mg by mouth daily as needed (flare up).     . raloxifene (EVISTA) 60 MG tablet Take 60 mg by mouth daily.    Marland Kitchen lidocaine-prilocaine (EMLA) cream Apply a quarter size amount to port site 1 hour prior to chemo. Do not rub in. Cover with plastic wrap. (Patient not taking: Reported on 02/01/2015) 30 g 3   No current facility-administered medications for this visit.    Allergies  Allergen Reactions  . Codeine Other (See Comments)    Blurred vision and dizziness blurred vision, dizzy, loss of hearing  . Acyclovir And Related Swelling, Dermatitis and Rash    fatigue  . Other     TB test- arm swells/fever to extremity  Tegaderm badage- pulls skin off, rash  . Tuberculin Ppd Other (See Comments)    Swelling and redness in arm  . Tylox [Oxycodone-Acetaminophen] Other (See Comments)    Blurred vision and dizziness Blurred vision, dizziness    Review of Systems  Constitutional: Positive for fatigue. Negative for appetite change and unexpected weight change.  Eyes: Negative for visual disturbance.  Respiratory: Positive for cough. Negative for shortness of breath and wheezing.   Cardiovascular: Negative for chest pain and leg swelling.  Gastrointestinal: Positive for constipation. Negative for abdominal pain and blood in stool.  Genitourinary: Negative for dysuria and difficulty urinating.  Musculoskeletal: Positive for joint swelling and arthralgias.       Recently pain and left upper back and left shoulder  Skin:       Varicose veins  Neurological: Positive for numbness. Negative for syncope.  Hematological: Negative for adenopathy. Does not bruise/bleed easily.  All other systems reviewed and are negative.   BP 115/66 mmHg  Pulse 103  Resp 16  Ht '4\' 11"'$  (1.499 m)  Wt 109 lb (49.442 kg)  BMI 22.00 kg/m2  SpO2  94% Physical Exam  Constitutional: She is oriented to person, place, and time. She appears well-developed and well-nourished. No distress.  HENT:  Head: Normocephalic and atraumatic.  Eyes: Conjunctivae and EOM are normal. No scleral icterus.  Neck: Neck supple. No thyromegaly present.  Cardiovascular: Normal rate, regular rhythm, normal heart sounds and intact distal pulses.  Exam reveals no gallop and no friction rub.   No murmur heard. Pulmonary/Chest: Effort normal  and breath sounds normal. No respiratory distress. She has no wheezes. She has no rales.  Abdominal: Soft. Bowel sounds are normal. She exhibits no distension. There is no tenderness.  Musculoskeletal: She exhibits no edema.  RA changes  Lymphadenopathy:    She has no cervical adenopathy.  Neurological: She is alert and oriented to person, place, and time. No cranial nerve deficit. She exhibits normal muscle tone.  Skin: Skin is warm and dry.  Vitals reviewed.    Diagnostic Tests: CT CHEST WITH CONTRAST  TECHNIQUE: Multidetector CT imaging of the chest was performed using thin slice collimation for electromagnetic bronchoscopy planning purposes, with intravenous contrast.  CONTRAST: 37m OMNIPAQUE IOHEXOL 300 MG/ML SOLN  COMPARISON: PET-CT 01/14/2015  FINDINGS: Mediastinum/Lymph Nodes: No axillary or supraclavicular lymphadenopathy. Port in RIGHT chest wall. No new mediastinal adenopathy. No pericardial fluid. Esophagus mildly thickened distally. Small of fluid esophagus. The central pulmonary embolism.  Lungs/Pleura: Large of pulmonary mass is abutting the pericardial surface is again demonstrated. The more superior mass measures 7.0 x 4.6 increased from 5.8 by 4.2 cm. More inferior mass adjacent to the LEFT cardiac border measures 5.2 by 3.2 cm increased from 3.3 by 2.3 cm. Lesion position between the 2 dominant nodules is also increased (image 31, series 3)  Trace bilateral pleural effusions.  No specific pulmonary nodularity.  The upper abdomen is normal adrenal glands. Tiny hypodense lesion liver on image 46, series 2 is likely benign.  Review of the skeleton is unremarkable.  IMPRESSION: 1. Significant short interval enlargement of pulmonary masses in the LEFT hemithorax along the LEFT mediastinal / cardiac border consistent with progression of metastatic process. 2. No mediastinal adenopathy. No additional pulmonary parenchymal lesions. These results will be called to the ordering clinician or representative by the Radiologist Assistant, and communication documented in the PACS or zVision Dashboard.   Electronically Signed  By: SSuzy BouchardM.D.  On: 01/24/2015 17:29      NUCLEAR MEDICINE PET SKULL BASE TO THIGH  TECHNIQUE: 12.3 mCi F-18 FDG was injected intravenously. Full-ring PET imaging was performed from the skull base to thigh after the radiotracer. CT data was obtained and used for attenuation correction and anatomic localization.  FASTING BLOOD GLUCOSE: Value: 81 mg/dl  COMPARISON: 12/06/2014 PET-CT.  FINDINGS: NECK  No hypermetabolic lymph nodes in the neck.  CHEST  There is an irregular hypermetabolic anterior medial left upper lobe lung mass (series 3/image 81) with max SUV 15.0, which has significantly increased in size and metabolism, previously 3.6 x 2.8 cm with max SUV 9.0. There is new hypermetabolism with associated pleural thickening within the anterior left pleural space with max SUV 4.6, with associated small layering left pleural effusion, in keeping with a malignant left pleural effusion.  There are 3 subcentimeter right upper lobe pulmonary nodules measuring up to 0.8 cm in size (series 3/image 60), which are unchanged in size and demonstrate no significant metabolism.  There are newly mildly hypermetabolic subcentimeter left mediastinal lymph nodes, including a 0.7 cm AP window node (series 3/image  65) with max SUV 2.6, a 0.7 cm prevascular node (series 3/image 65) with max SUV 2.3 and a 0.7 cm upper left paratracheal node (series 3/image 53) with max SUV 2.3.  No hypermetabolic axillary nodes. Left axillary nodal dissection clips are again noted. Stable appearance of the chest wall status post left mastectomy. No hypermetabolic superficial chest wall masses. Right internal jugular MediPort terminates at the cavoatrial junction.  ABDOMEN/PELVIS  No abnormal hypermetabolic activity within the liver,  pancreas, adrenal glands, or spleen. No hypermetabolic lymph nodes in the abdomen or pelvis. Stable non hypermetabolic 2.2 cm inferior right liver lobe cyst. Hysterectomy.  SKELETON  No focal hypermetabolic activity to suggest skeletal metastasis. Previously described reactive marrow uptake has resolved.  IMPRESSION: 1. Significant interval progression of hypermetabolic left upper lobe lung mass. 2. New small malignant left pleural effusion with associated left pleural thickening and hypermetabolism anteriorly adjacent to the progressive left upper lobe lung mass. 3. Newly mildly hypermetabolic left mediastinal lymphadenopathy, likely representing mediastinal nodal metastases. 4. Right pulmonary nodules are stable and non hypermetabolic. 5. No hypermetabolic metastatic disease in the neck, abdomen or pelvis. No hypermetabolic bone metastases.   Electronically Signed  By: Ilona Sorrel M.D.  On: 01/14/2015 13:06 I personally reviewed the CT chest and PET CTs dating back to October and concur with the findings as noted above. She had multiple bilateral lung metastases. The majority of these have responded to treatment, but 2 masses are present in the left upper lobe and a rapidly growing. She also has what appears to be new mediastinal adenopathy on the most recent PET.  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 She has been treated for this with chemotherapy with an excellent response for the most part. However, there are 2 masses in the lingula adjacent to the heart which have grown rapidly over the past couple of months. A CT-guided needle biopsy was negative showing only necrosis. That is not surprising given the rapid growth of these masses.  I recommended to Mrs. Barnfield that we proceed with a navigational bronchoscopy for diagnostic purposes. Dr. Whitney Muse was kind enough to do her recent CT with a super dimension protocol, so that is already taking care of.  I described the procedure to her. She understands that this would be an endoscopic procedure done in the operating room under general anesthesia. We would plan to do this on an outpatient basis. We would use general anesthesia to improve diagnostic yield both by improving the results with navigation as well as allowing [place] to take more biopsies than might be possible with an awake bronchoscopy. I reviewed the indications, risks, benefits, and alternatives. She understands the risks include, but are not limited to death, MI, DVT, PE, stroke, bleeding, pneumothorax, failure to make a diagnosis, as well as the possibility of other unforeseeable complications.  She accepts the risks and agrees to proceed.  Plan: Navigational bronchoscopy on Monday, 02/04/2015  [name], MD [facility] [phone]

## 2015-02-04 NOTE — Brief Op Note (Addendum)
02/04/2015  75:44 AM  PATIENT:  Alejandra Callahan  70 y.o. female  PRE-OPERATIVE DIAGNOSIS:  left lung masses  POST-OPERATIVE DIAGNOSIS:  left lung masses  PROCEDURE:  Procedure(s): VIDEO BRONCHOSCOPY WITH ENDOBRONCHIAL NAVIGATION (N/A)  Brushings, biopsies, Gen cut and needle aspirations and BAL ASPIRATION of LEFT PLEURAL SPACE  SURGEON:  Surgeon(s) and Role:    * [name], MD - Primary  ANESTHESIA:   general  EBL:     BLOOD ADMINISTERED:none  DRAINS: none   LOCAL MEDICATIONS USED:  NONE  SPECIMEN:  Source of Specimen:  Left upper lobe mass  DISPOSITION OF SPECIMEN:  PATHOLOGY  PLAN OF CARE: Admit for overnight observation  PATIENT DISPOSITION:  PACU - hemodynamically stable.   Delay start of Pharmacological VTE agent (>24hrs) due to surgical blood loss or risk of bleeding:  Yes  Left Pneumothorax near end of procedure- Aspirated with 18 gauge angiocath. Will keep overnight for observation.

## 2015-02-04 NOTE — Op Note (Signed)
NAME:  Alejandra Callahan, Alejandra Callahan NO.:  [id number]  MEDICAL RECORD NO.:  [id number]  LOCATION:  2W36C                        FACILITY:  [facility]  PHYSICIAN:  Revonda Standard. Roxan Hockey, M.D.DATE OF BIRTH:  December 29, 1945  DATE OF PROCEDURE:  02/04/2015 DATE OF DISCHARGE:                              OPERATIVE REPORT   PREOPERATIVE DIAGNOSIS:  Left lung masses.  POSTOPERATIVE DIAGNOSIS:  Left lung masses.  PROCEDURE:   1. Bronchoscopy with brushings, biopsies, needle aspirations, Gen-cut aspirations, and bronchoalveolar lavage.  2. Aspiration of left pleural space.  SURGEON:  Revonda Standard. Roxan Hockey, M.D.  ASSISTANT:  None.  ANESTHESIA:  General.  FINDINGS:  Difficult to get a direct approach to the lingular masses. Initial biopsies and brushings nondiagnostic.  Pneumothorax noted near end of the procedure.  CLINICAL NOTE:  Alejandra Callahan is a 70 year old woman with a history of metastatic endometrial cancer.  She recently has completed a course of Treatment. Despite a good overall response with her lung metastases, 2 masses in the lung, both in the lingula and adjacent to each other, had grown significantly over the past month.  A CT-guided needle biopsy was nondiagnostic and she now was advised to undergo navigational bronchoscopy for sampling in hopes of making a definitive diagnosis. The indications, risks, benefits, and alternatives were discussed in detail with the patient.  She understood and accepted the risks and agreed to proceed.  OPERATIVE NOTE:  Alejandra Callahan was brought to the operating room on February 04, 2015. She had induction of general anesthesia and was intubated. Sequential compression devices were placed for DVT prophylaxis. Flexible fiberoptic bronchoscopy was performed via the endotracheal tube.  It revealed normal endobronchial anatomy, with no endobronchial lesions on the right side.  On the left side, the left lower lobe bronchus was normal.  There appeared  to be some extrinsic compression or mass affect in the area between the upper and lower lobe bronchus as well as affecting the upper lobe bronchi.  No endobronchial lesions were present.  The locatable guide for the navigation then was advanced.  Mapping was performed.  There was good correlation between the video and virtual bronchoscopy.  The locatable guide then was navigated to the vicinity of the 2 lower lobe masses. Multiple sites and pathways to those sites had been selected in the preoperative planning phase.  Specimen collection was performed at multiple sites and from different angles. Needle brushings were performed and then multiple biopsies were taken. The scope then was navigated to a separate area that had been mapped and the process was repeated.  These specimens were sent for frozen section and quick prep.  While awaiting these results, multiple additional biopsies were performed.  Needle aspirations were performed as well.  The first and fifth sites seem to give the most centered access to the lesion and Gen-cut biopsies with aspiration were performed from these areas.  Bronchoalveolar lavage was performed as well.  The initial specimens returned showing some atypical cells, but no definite cancer.  The brushings showed only bronchial epithelial cells.  Additional brushings were taken using the needle brush and additional biopsies were performed as well.  At this point, after multiple samples  have been taken, fluoroscopy revealed a left pneumothorax.  The decision was made not to take any additional biopsies. The scope was removed.  The left anterior chest was prepped in a sterile fashion.  A 16-gauge Angiocath was advanced into the chest and a syringe with a three-way stopcock was attached and air was aspirated from the left pleural space.  The patient then was extubated in the operating room and taken to the postanesthetic care unit in good condition.  A chest x-ray  will be performed in the PACU to assess whether a chest tube needs to be placed.     Revonda Standard Roxan Hockey, M.D.     SCH/MEDQ  D:  02/04/2015  T:  02/04/2015  Job:  [id number]

## 2015-02-04 NOTE — Transfer of Care (Signed)
Immediate Anesthesia Transfer of Care Note  Patient: Alejandra Callahan  Procedure(s) Performed: Procedure(s): VIDEO BRONCHOSCOPY WITH ENDOBRONCHIAL NAVIGATION (N/A)  Patient Location: PACU  Anesthesia Type:General  Level of Consciousness: awake and alert   Airway & Oxygen Therapy: Patient Spontanous Breathing and Patient connected to nasal cannula oxygen  Post-op Assessment: Report given to RN and Post -op Vital signs reviewed and stable  Post vital signs: Reviewed and stable  Last Vitals:  Filed Vitals:   02/04/15 0617 02/04/15 1015  BP: 120/46 110/59  Pulse: 106   Temp: 37.2 C   Resp: 16 21    Complications: No apparent anesthesia complications

## 2015-02-05 ENCOUNTER — Observation Stay (HOSPITAL_COMMUNITY): Payer: Medicare Other

## 2015-02-05 ENCOUNTER — Encounter (HOSPITAL_COMMUNITY): Payer: Self-pay | Admitting: Thoracic Surgery (Cardiothoracic Vascular Surgery)

## 2015-02-05 MED ORDER — ACETAMINOPHEN 325 MG PO TABS: 650.0000 mg | ORAL_TABLET | Freq: Four times a day (QID) | ORAL | Status: DC | PRN

## 2015-02-05 NOTE — Progress Notes (Addendum)
      [address]       [place] [address]             [phone]      1 Day Post-Op Procedure(s) (LRB): VIDEO BRONCHOSCOPY WITH ENDOBRONCHIAL NAVIGATION (N/A)   Subjective:  Alejandra Callahan denies shortness of breath and chest pain.  She is hopeful to go home today.    Objective: Vital signs in last 24 hours: Temp:  [97.8 F (36.6 C)-99.3 F (37.4 C)] 98.9 F (37.2 C) (01/31 0639) Pulse Rate:  [92-111] 107 (01/31 0639) Cardiac Rhythm:  [-] Sinus tachycardia (01/30 1900) Resp:  [15-29] 18 (01/31 0639) BP: (85-110)/(40-59) 89/47 mmHg (01/31 0639) SpO2:  [90 %-99 %] 95 % (01/31 0639) Weight:  [115 lb 4.8 oz (52.3 kg)] 115 lb 4.8 oz (52.3 kg) (01/30 1416)  Intake/Output from previous day: 01/30 0701 - 01/31 0700 In: 1000 [I.V.:1000] Out: -   General appearance: alert, cooperative and no distress Heart: regular rate and rhythm Lungs: clear to auscultation bilaterally Abdomen: soft, non-tender; bowel sounds normal; no masses,  no organomegaly  Lab Results: No results for input(s): WBC, HGB, HCT, PLT in the last 72 hours. BMET: No results for input(s): NA, K, CL, CO2, GLUCOSE, BUN, CREATININE, CALCIUM in the last 72 hours.  PT/INR:  Recent Labs  02/04/15 0629  LABPROT 14.3  INR 1.09   ABG    Component Value Date/Time   TCO2 23 12/10/2011 0336   CBG (last 3)  No results for input(s): GLUCAP in the last 72 hours.  Assessment/Plan: S/P Procedure(s) (LRB): VIDEO BRONCHOSCOPY WITH ENDOBRONCHIAL NAVIGATION (N/A)  1. S/P EBUS with Biopsy 2. Pneumothorax- CXR shows left pneumothorax, remains small... No chest tube indicated at this time 3. Dispo- patient stable, possibly d/c home today vs. Repeat CXR in the afternoon     BARRETT, ERIN 02/05/2015  Patient seen and examined, agree with above CXR read as slight increase. I think it is stable when compared to initial postop film. She is asymptomatic and it has been 22 hours since first noted Dc  home I will see back in the office next week to discuss results  Remo Lipps C. Roxan Hockey, MD [facility] [phone]

## 2015-02-05 NOTE — Discharge Summary (Signed)
Physician Discharge Summary       [address]       [place] [address]             [phone]    Patient ID: ETTER ROYALL MRN: [id number] DOB/AGE: [id number] y.o.  Admit date: 02/04/2015 Discharge date: 02/05/2015  Admission Diagnosis: Left lung masses  Active Diagnoses:  1. Pneumothorax 2. Breast cancer ([place]) 3. Endometrial cancer ([place]) 4. Rheumatoid arthritis  Procedure (s):  Bronchoscopy with brushings, biopsies, needle aspirations, Gen-cut aspirations, and bronchoalveolar lavage, aspiration of left pleural space by Dr. Roxan Hockey on 02/04/2015.   Pathology: Results pending  History of Presenting Illness: This is a 70 year old woman with a complex medical history including rheumatoid arthritis, breast cancer in 1982, endometrial cancer treated in 2014 with a recurrence with lung metastases in the summer of 2016. She was doing well until the summer of 2016. In June she noted a significant cough. She was evaluated for that and found to have multiple bilateral lung metastases (9 in all). She was treated with carboplatin and Taxol with a good response. However on a PET CT done in December there was a marked increase in size of a left lingular mass. She was treated with antibiotics and had a follow-up PET in January which showed a marked increase in size of 2 adjacent masses in the left upper lobe. A CT-guided needle biopsy was done, but showed only necrosis.  She complains of decreased energy. She denies shortness of breath or wheezing. She has a dry cough. She denies hemoptysis. She has been having some left upper back and shoulder pain, which she attributed to her rheumatoid arthritis.. She has not had any significant weight loss. She is very anxious about the findings on her recent scans. She also has been dealing with her husband's health issues, as he has liver failure. Dr. Roxan Hockey recommended the patient to proceed with a navigational bronchoscopy for  diagnostic purposes. Potential risks, benefits, and complications were discussed with the patient and she agreed to proceed with surgery. She underwent a bronchoscopy with brushings, biopsies, bronchoalveolar lavage, aspiration of left pleural space on 02/04/2015.  Brief Hospital Course:  She has remained afebrile and hemodynamically stable. Post op chest x ray showed a left pneumothorax. She was not in any respiratory distress. She was admitted overnight for observation. Chest x ray done this am showed slight increase in left apical pneumothorax. No change in lingular mass and left effusion. Mild right lower lobe atelectasis is also unchanged.  Her oxygen saturation is 95% on room air. She is felt surgically stable for discharge today.   Latest Vital Signs: Blood pressure 89/47, pulse 107, temperature 98.9 F (37.2 C), temperature source Oral, resp. rate 18, height '4\' 11"'$  (1.499 m), weight 115 lb 4.8 oz (52.3 kg), SpO2 95 %.  Physical Exam: General appearance: alert, cooperative and no distress Heart: regular rate and rhythm Lungs: clear to auscultation bilaterally Abdomen: soft, non-tender; bowel sounds normal; no masses, no organomegaly  Discharge Condition:Stable and discharged to home  Recent laboratory studies:  Lab Results  Component Value Date   WBC 7.2 02/01/2015   HGB 10.1* 02/01/2015   HCT 31.1* 02/01/2015   MCV 108.0* 02/01/2015   PLT 250 02/01/2015   Lab Results  Component Value Date   NA 140 02/01/2015   K 3.9 02/01/2015   CL 106 02/01/2015   CO2 28 02/01/2015   CREATININE 0.59 02/01/2015   GLUCOSE 90 02/01/2015   Diagnostic Studies:  Dg Chest 2 View  02/05/2015  CLINICAL DATA:  Pneumothorax followup EXAM: CHEST  2 VIEW COMPARISON:  02/04/2015 FINDINGS: Slight increase in left apical pneumothorax. No change in lingular mass and left effusion. Mild right lower lobe atelectasis also unchanged. Port-A-Cath tip in the SVC IMPRESSION: Slight increase in left apical  pneumothorax No change lingular mass and left effusion. Mild right lower lobe atelectasis also unchanged. Electronically Signed   By: Franchot Gallo M.D.   On: 02/05/2015 07:28    Nm Pet Image Restag (ps) Skull Base To Thigh  01/14/2015  CLINICAL DATA:  Subsequent treatment strategy for endometrial carcinoma diagnosed in November 2014, status post TAHBSO on 12/09/2012, with lung metastases detected in July 2016, status post chemotherapy, with progression of left upper lobe pulmonary metastasis described on prior PET-CT. Remote history of left breast cancer. EXAM: NUCLEAR MEDICINE PET SKULL BASE TO THIGH TECHNIQUE: 12.3 mCi F-18 FDG was injected intravenously. Full-ring PET imaging was performed from the skull base to thigh after the radiotracer. CT data was obtained and used for attenuation correction and anatomic localization. FASTING BLOOD GLUCOSE:  Value: 81 mg/dl COMPARISON:  12/06/2014 PET-CT. FINDINGS: NECK No hypermetabolic lymph nodes in the neck. CHEST There is an irregular hypermetabolic anterior medial left upper lobe lung mass (series 3/image 81) with max SUV 15.0, which has significantly increased in size and metabolism, previously 3.6 x 2.8 cm with max SUV 9.0. There is new hypermetabolism with associated pleural thickening within the anterior left pleural space with max SUV 4.6, with associated small layering left pleural effusion, in keeping with a malignant left pleural effusion. There are 3 subcentimeter right upper lobe pulmonary nodules measuring up to 0.8 cm in size (series 3/image 60), which are unchanged in size and demonstrate no significant metabolism. There are newly mildly hypermetabolic subcentimeter left mediastinal lymph nodes, including a 0.7 cm AP window node (series 3/image 65) with max SUV 2.6, a 0.7 cm prevascular node (series 3/image 65) with max SUV 2.3 and a 0.7 cm upper left paratracheal node (series 3/image 53) with max SUV 2.3. No hypermetabolic axillary nodes. Left  axillary nodal dissection clips are again noted. Stable appearance of the chest wall status post left mastectomy. No hypermetabolic superficial chest wall masses. Right internal jugular MediPort terminates at the cavoatrial junction. ABDOMEN/PELVIS No abnormal hypermetabolic activity within the liver, pancreas, adrenal glands, or spleen. No hypermetabolic lymph nodes in the abdomen or pelvis. Stable non hypermetabolic 2.2 cm inferior right liver lobe cyst. Hysterectomy. SKELETON No focal hypermetabolic activity to suggest skeletal metastasis. Previously described reactive marrow uptake has resolved. IMPRESSION: 1. Significant interval progression of hypermetabolic left upper lobe lung mass. 2. New small malignant left pleural effusion with associated left pleural thickening and hypermetabolism anteriorly adjacent to the progressive left upper lobe lung mass. 3. Newly mildly hypermetabolic left mediastinal lymphadenopathy, likely representing mediastinal nodal metastases. 4. Right pulmonary nodules are stable and non hypermetabolic. 5. No hypermetabolic metastatic disease in the neck, abdomen or pelvis. No hypermetabolic bone metastases. Electronically Signed   By: Ilona Sorrel M.D.   On: 01/14/2015 13:06   Ct Biopsy  01/18/2015  CLINICAL DATA:  Left upper lobe lung mass EXAM: CT-GUIDED BIOPSY LEFT UPPER LOBE LUNG MASS.  CORE. MEDICATIONS AND MEDICAL HISTORY: Versed 1 mg, Fentanyl 50 mcg. Additional Medications: None. ANESTHESIA/SEDATION: Moderate sedation time: 10 minutes PROCEDURE: The procedure, risks, benefits, and alternatives were explained to the patient. Questions regarding the procedure were encouraged and answered. The patient understands and consents to the  procedure. The anterior thorax was prepped with ChloraPrep in a sterile fashion, and a sterile drape was applied covering the operative field. A sterile gown and sterile gloves were used for the procedure. Under CT guidance, a(n) 17 gauge guide  needle was advanced into the left upper lobe lung mass. Subsequently four 18 gauge core biopsies were obtained. The guide needle was removed. Final imaging was performed. Patient tolerated the procedure well without complication. Vital sign monitoring by nursing staff during the procedure will continue as patient is in the special procedures unit for post procedure observation. FINDINGS: The images document guide needle placement within the left upper lobe lung mass. Post biopsy images demonstrate no hemorrhage. COMPLICATIONS: None IMPRESSION: Successful CT-guided left upper lobe lung mass core biopsy. Electronically Signed   By: Marybelle Killings M.D.   On: 01/18/2015 12:43   Ct Super D Chest W Contrast  01/24/2015  CLINICAL DATA:  Subsequent treatment for breast cancer and endometrial carcinoma. Interval enlargement of LEFT pulmonary masses on comparison PET-CT scan. Last chemotherapy November 2016 EXAM: CT CHEST WITH CONTRAST TECHNIQUE: Multidetector CT imaging of the chest was performed using thin slice collimation for electromagnetic bronchoscopy planning purposes, with intravenous contrast. CONTRAST:  21m OMNIPAQUE IOHEXOL 300 MG/ML  SOLN COMPARISON:  PET-CT 01/14/2015 FINDINGS: Mediastinum/Lymph Nodes: No axillary or supraclavicular lymphadenopathy. Port in RIGHT chest wall. No new mediastinal adenopathy. No pericardial fluid. Esophagus mildly thickened distally. Small of fluid esophagus. The central pulmonary embolism. Lungs/Pleura: Large of pulmonary mass is abutting the pericardial surface is again demonstrated. The more superior mass measures 7.0 x 4.6 increased from 5.8 by 4.2 cm. More inferior mass adjacent to the LEFT cardiac border measures 5.2 by 3.2 cm increased from 3.3 by 2.3 cm. Lesion position between the 2 dominant nodules is also increased (image 31, series 3) Trace bilateral pleural effusions. No specific pulmonary nodularity. The upper abdomen is normal adrenal glands. Tiny hypodense lesion  liver on image 46, series 2 is likely benign. Review of the skeleton is unremarkable. IMPRESSION: 1. Significant short interval enlargement of pulmonary masses in the LEFT hemithorax along the LEFT mediastinal / cardiac border consistent with progression of metastatic process. 2. No mediastinal adenopathy. No additional pulmonary parenchymal lesions. These results will be called to the ordering clinician or representative by the Radiologist Assistant, and communication documented in the PACS or zVision Dashboard. Electronically Signed   By: SSuzy BouchardM.D.   On: 01/24/2015 17:29   Discharge Medications:   Medication List    TAKE these medications        acetaminophen 325 MG tablet  Commonly known as:  TYLENOL  Take 2 tablets (650 mg total) by mouth every 6 (six) hours as needed for fever, headache, mild pain or moderate pain.     alendronate 70 MG tablet  Commonly known as:  FOSAMAX  Take 70 mg by mouth once a week. Take with a full glass of water on an empty stomach. Monday     CALTRATE 600 PLUS-VIT D PO  Take 1 each by mouth 2 (two) times daily.     cholecalciferol 1000 units tablet  Commonly known as:  VITAMIN D  Take 1,000 Units by mouth daily.     folic acid 8301MCG tablet  Commonly known as:  FOLVITE  Take 800 mcg by mouth daily.     methotrexate 2.5 MG tablet  Commonly known as:  RHEUMATREX  Take 10 mg by mouth 2 (two) times a week. Caution:Chemotherapy. Protect from light.  Pt to takes 2.'5mg'$  tabs- 4 tablets on Friday and 4 tablets on Saturday     multivitamin tablet  Take 1 tablet by mouth daily.     naproxen sodium 220 MG tablet  Commonly known as:  ANAPROX  Take 220 mg by mouth 2 (two) times daily with a meal.     predniSONE 5 MG tablet  Commonly known as:  DELTASONE  Take 5 mg by mouth daily as needed (flare up).     raloxifene 60 MG tablet  Commonly known as:  EVISTA  Take 60 mg by mouth daily.        Follow Up Appointments: Follow-up Information      Follow up with [name], MD.   Specialty:  Cardiothoracic Surgery   Why:  PA/LAT CXR to be taken (at [facility] which is in the same building as Dr. Leonarda Salon office) one hour prior to office appointment;Office will mail appointment date and time   Contact information:   [address] [place] [address] [phone]       Signed: Lars Pinks MPA-C 02/05/2015, 8:39 AM

## 2015-02-05 NOTE — Progress Notes (Signed)
Patient IV removed. D/C instructions given to patients and family. Awaiting discharge via wheelchair.   Alejandra Callahan, Mervin Kung RN

## 2015-02-05 NOTE — Addendum Note (Signed)
Addendum  created 02/05/15 1543 by Eligha Bridegroom, CRNA   Modules edited: Anesthesia Events, Narrator   Narrator:  Narrator: Event Log Edited

## 2015-02-05 NOTE — Discharge Instructions (Signed)
Pneumothorax °A pneumothorax, commonly called a collapsed lung, is a condition in which air leaks from a lung and builds up in the space between the lung and the chest wall (pleural space). The air in a pneumothorax is trapped outside the lung and takes up space, preventing the lung from fully expanding. This is a condition that usually occurs suddenly. The buildup of air may be small or large. A small pneumothorax may go away on its own. When a pneumothorax is larger, it will often require medical treatment and hospitalization.  °CAUSES  °A pneumothorax can sometimes happen quickly with no apparent cause. People with underlying lung problems, particularly COPD or emphysema, are at higher risk of pneumothorax. However, pneumothorax can happen quickly even in people with no prior known lung problems. Trauma, surgery, medical procedures, or injury to the chest wall can also cause a pneumothorax. °SIGNS AND SYMPTOMS  °Sometimes a pneumothorax will have no symptoms. When symptoms are present, they can include: °· Chest pain. °· Shortness of breath. °· Increased rate of breathing. °· Bluish color to your lips or skin (cyanosis). °DIAGNOSIS  °Pneumothorax is usually diagnosed by a chest X-ray or chest CT scan. Your health care provider will also take a medical history and perform a physical exam to determine why you may have a pneumothorax. °TREATMENT  °A small pneumothorax may go away on its own without treatment. Extra oxygen can sometimes help a small pneumothorax go away more quickly. For a larger pneumothorax or a pneumothorax that is causing symptoms, a procedure is usually needed to drain the air. In some cases, the health care provider may drain the air using a needle. In other cases, a chest tube may be inserted into the pleural space. A chest tube is a small tube placed between the ribs and into the pleural space. This removes the extra air and allows the lung to expand back to its normal size. A large  pneumothorax will usually require a hospital stay. If there is ongoing air leakage into the pleural space, then the chest tube may need to remain in place for several days until the air leak has healed. In some cases, surgery may be needed.  °HOME CARE INSTRUCTIONS  °· Only take over-the-counter or prescription medicines as directed by your health care provider. °· If a cough or pain makes it difficult for you to sleep at night, try sleeping in a semi-upright position in a recliner or by using 2 or 3 pillows. °· Rest and limit activity as directed by your health care provider. °· If you had a chest tube and it was removed, ask your health care provider when it is okay to remove the dressing. Until your health care provider says you can remove the dressing, do not allow it to get wet. °· Do not smoke. Smoking is a risk factor for pneumothorax. °· Do not fly in an airplane or scuba dive until your health care provider says it is okay. °· Follow up with your health care provider as directed. °SEEK IMMEDIATE MEDICAL CARE IF:  °· You have increasing chest pain or shortness of breath. °· You have a cough that is not controlled with suppressants. °· You begin coughing up blood. °· You have pain that is getting worse or is not controlled with medicines. °· You cough up thick, discolored mucus (sputum) that is yellow to green in color. °· You have redness, increasing pain, or discharge at the site where a chest tube had been in place (if   your pneumothorax was treated with a chest tube).  The site where your chest tube was located opens up.  You feel air coming out of the site where the chest tube was placed.  You have a fever or persistent symptoms for more than 2-3 days.  You have a fever and your symptoms suddenly get worse. MAKE SURE YOU:   Understand these instructions.  Will watch your condition.  Will get help right away if you are not doing well or get worse.   This information is not intended to  replace advice given to you by your health care provider. Make sure you discuss any questions you have with your health care provider.   Document Released: 12/22/2004 Document Revised: 10/12/2012 Document Reviewed: 07/21/2012 Elsevier Interactive Patient Education [employer].

## 2015-02-06 ENCOUNTER — Emergency Department (HOSPITAL_COMMUNITY): Payer: Medicare Other

## 2015-02-06 ENCOUNTER — Encounter (HOSPITAL_COMMUNITY): Payer: Self-pay

## 2015-02-06 ENCOUNTER — Inpatient Hospital Stay (HOSPITAL_COMMUNITY)
Admission: EM | Admit: 2015-02-06 | Discharge: 2015-02-14 | DRG: 166 | Disposition: A | Discharge status: Hospice | Payer: Medicare Other | Attending: Internal Medicine | Admitting: Internal Medicine

## 2015-02-06 DIAGNOSIS — R918 Other nonspecific abnormal finding of lung field: Secondary | ICD-10-CM | POA: Diagnosis present

## 2015-02-06 DIAGNOSIS — D630 Anemia in neoplastic disease: Secondary | ICD-10-CM | POA: Diagnosis present

## 2015-02-06 DIAGNOSIS — J910 Malignant pleural effusion: Secondary | ICD-10-CM | POA: Diagnosis present

## 2015-02-06 DIAGNOSIS — Z7189 Other specified counseling: Secondary | ICD-10-CM | POA: Insufficient documentation

## 2015-02-06 DIAGNOSIS — J948 Other specified pleural conditions: Secondary | ICD-10-CM | POA: Diagnosis not present

## 2015-02-06 DIAGNOSIS — D6489 Other specified anemias: Secondary | ICD-10-CM | POA: Diagnosis not present

## 2015-02-06 DIAGNOSIS — C7802 Secondary malignant neoplasm of left lung: Secondary | ICD-10-CM | POA: Diagnosis present

## 2015-02-06 DIAGNOSIS — M069 Rheumatoid arthritis, unspecified: Secondary | ICD-10-CM | POA: Diagnosis present

## 2015-02-06 DIAGNOSIS — Z90710 Acquired absence of both cervix and uterus: Secondary | ICD-10-CM | POA: Diagnosis not present

## 2015-02-06 DIAGNOSIS — J90 Pleural effusion, not elsewhere classified: Secondary | ICD-10-CM | POA: Diagnosis not present

## 2015-02-06 DIAGNOSIS — Z885 Allergy status to narcotic agent status: Secondary | ICD-10-CM | POA: Diagnosis not present

## 2015-02-06 DIAGNOSIS — J9382 Other air leak: Secondary | ICD-10-CM | POA: Diagnosis not present

## 2015-02-06 DIAGNOSIS — Z79899 Other long term (current) drug therapy: Secondary | ICD-10-CM | POA: Diagnosis not present

## 2015-02-06 DIAGNOSIS — Z515 Encounter for palliative care: Secondary | ICD-10-CM | POA: Diagnosis present

## 2015-02-06 DIAGNOSIS — Z888 Allergy status to other drugs, medicaments and biological substances status: Secondary | ICD-10-CM | POA: Diagnosis not present

## 2015-02-06 DIAGNOSIS — J939 Pneumothorax, unspecified: Secondary | ICD-10-CM | POA: Diagnosis present

## 2015-02-06 DIAGNOSIS — C541 Malignant neoplasm of endometrium: Secondary | ICD-10-CM | POA: Diagnosis present

## 2015-02-06 DIAGNOSIS — R0600 Dyspnea, unspecified: Secondary | ICD-10-CM | POA: Diagnosis not present

## 2015-02-06 DIAGNOSIS — Z4682 Encounter for fitting and adjustment of non-vascular catheter: Secondary | ICD-10-CM

## 2015-02-06 DIAGNOSIS — C7801 Secondary malignant neoplasm of right lung: Secondary | ICD-10-CM | POA: Diagnosis present

## 2015-02-06 DIAGNOSIS — Z09 Encounter for follow-up examination after completed treatment for conditions other than malignant neoplasm: Secondary | ICD-10-CM

## 2015-02-06 DIAGNOSIS — R222 Localized swelling, mass and lump, trunk: Secondary | ICD-10-CM | POA: Diagnosis not present

## 2015-02-06 DIAGNOSIS — R0602 Shortness of breath: Secondary | ICD-10-CM

## 2015-02-06 DIAGNOSIS — D509 Iron deficiency anemia, unspecified: Secondary | ICD-10-CM | POA: Diagnosis present

## 2015-02-06 DIAGNOSIS — Z853 Personal history of malignant neoplasm of breast: Secondary | ICD-10-CM | POA: Diagnosis not present

## 2015-02-06 DIAGNOSIS — E871 Hypo-osmolality and hyponatremia: Secondary | ICD-10-CM | POA: Diagnosis present

## 2015-02-06 DIAGNOSIS — Z9012 Acquired absence of left breast and nipple: Secondary | ICD-10-CM | POA: Diagnosis not present

## 2015-02-06 DIAGNOSIS — J9811 Atelectasis: Secondary | ICD-10-CM | POA: Diagnosis present

## 2015-02-06 DIAGNOSIS — T17908A Unspecified foreign body in respiratory tract, part unspecified causing other injury, initial encounter: Secondary | ICD-10-CM

## 2015-02-06 DIAGNOSIS — T451X5A Adverse effect of antineoplastic and immunosuppressive drugs, initial encounter: Secondary | ICD-10-CM | POA: Diagnosis present

## 2015-02-06 DIAGNOSIS — Z7983 Long term (current) use of bisphosphonates: Secondary | ICD-10-CM | POA: Diagnosis not present

## 2015-02-06 DIAGNOSIS — J9601 Acute respiratory failure with hypoxia: Secondary | ICD-10-CM | POA: Diagnosis present

## 2015-02-06 LAB — BRAIN NATRIURETIC PEPTIDE: B Natriuretic Peptide: 61 pg/mL (ref 0.0–100.0)

## 2015-02-06 LAB — BASIC METABOLIC PANEL
Anion gap: 7 (ref 5–15)
BUN: 18 mg/dL (ref 6–20)
CALCIUM: 8.6 mg/dL — AB (ref 8.9–10.3)
CHLORIDE: 104 mmol/L (ref 101–111)
CO2: 26 mmol/L (ref 22–32)
CREATININE: 0.58 mg/dL (ref 0.44–1.00)
GFR calc Af Amer: >60 mL/min (ref >60)
GFR calc non Af Amer: >60 mL/min (ref >60)
GLUCOSE: 99 mg/dL (ref 65–99)
Potassium: 3.9 mmol/L (ref 3.5–5.1)
SODIUM: 137 mmol/L (ref 135–145)

## 2015-02-06 LAB — CBC WITH DIFFERENTIAL/PLATELET
Basophils Absolute: 0 10*3/uL (ref 0.0–0.1)
Basophils Relative: 0 %
EOS PCT: 9 %
Eosinophils Absolute: 0.7 10*3/uL (ref 0.0–0.7)
HEMATOCRIT: 28.1 % — AB (ref 36.0–46.0)
Hemoglobin: 9.4 g/dL — ABNORMAL LOW (ref 12.0–15.0)
LYMPHS ABS: 1.5 10*3/uL (ref 0.7–4.0)
LYMPHS PCT: 20 %
MCH: 36 pg — AB (ref 26.0–34.0)
MCHC: 33.5 g/dL (ref 30.0–36.0)
MCV: 107.7 fL — AB (ref 78.0–100.0)
MONO ABS: 0.9 10*3/uL (ref 0.1–1.0)
MONOS PCT: 11 %
NEUTROS ABS: 4.7 10*3/uL (ref 1.7–7.7)
Neutrophils Relative %: 60 %
PLATELETS: 220 10*3/uL (ref 150–400)
RBC: 2.61 MIL/uL — ABNORMAL LOW (ref 3.87–5.11)
RDW: 15.5 % (ref 11.5–15.5)
WBC: 7.8 10*3/uL (ref 4.0–10.5)

## 2015-02-06 LAB — I-STAT CG4 LACTIC ACID, ED: Lactic Acid, Venous: 0.9 mmol/L (ref 0.5–2.0)

## 2015-02-06 LAB — LACTIC ACID, PLASMA: Lactic Acid, Venous: 0.8 mmol/L (ref 0.5–2.0)

## 2015-02-06 LAB — TROPONIN I: Troponin I: <0.03 ng/mL (ref <0.031)

## 2015-02-06 MED ORDER — BENZONATATE 100 MG PO CAPS
200.0000 mg | ORAL_CAPSULE | Freq: Three times a day (TID) | ORAL | Status: DC
  Administered 2015-02-06 – 2015-02-14 (×22): 200 mg via ORAL
  Filled 2015-02-06 (×24): qty 2

## 2015-02-06 MED ORDER — SODIUM CHLORIDE 0.9 % IV SOLN: INTRAVENOUS | Status: DC

## 2015-02-06 MED ORDER — HYDROCORTISONE NA SUCCINATE PF 100 MG IJ SOLR
50.0000 mg | Freq: Three times a day (TID) | INTRAMUSCULAR | Status: DC
  Administered 2015-02-06: 50 mg via INTRAVENOUS
  Filled 2015-02-06: qty 2

## 2015-02-06 MED ORDER — IOHEXOL 350 MG/ML SOLN
100.0000 mL | Freq: Once | INTRAVENOUS | Status: AC | PRN
  Administered 2015-02-06: 100 mL via INTRAVENOUS

## 2015-02-06 NOTE — ED Notes (Signed)
Pt speaking in complete sentences, no distress noted at present

## 2015-02-06 NOTE — ED Provider Notes (Signed)
CSN: [id number]     Arrival date & time 02/06/15  1817 History   First MD Initiated Contact with Patient 02/06/15 1821     Chief Complaint  Patient presents with  . Shortness of Breath     HPI Pt was seen at 1830. Per pt and her family, c/o gradual onset and persistence of constant SOB that began today. Pt states her SOB was associated with "coughing a lot." Pt was discharged from the hospital yesterday s/p lung biopsy and pneumothorax. Pt states she has been more SOB over the course of the day today. States she is SOB with any movement. Denies CP/palpitations, no back pain, no abd pain, no N/V/D, no fevers.    Past Medical History  Diagnosis Date  . Cancer (HCC)     breast  . Arthritis     Rheumatoid arthritis  . PONV (postoperative nausea and vomiting) 1982  . Breast cancer ([place]) 09/1980    left unilateral mastectomy, no chemo or radiatioin  . Endometrial cancer ([place]) 2014  . Lung mass   . Pneumonia     hx   Past Surgical History  Procedure Laterality Date  . Mastectomy Left 09/1980  . Tubal ligation  April 1982  . Dilatation & currettage/hysteroscopy with resectocope N/A 11/18/2012    Procedure: DILATATION & CURETTAGE/HYSTEROSCOPY WITH RESECTOCOPE;  Surgeon: Marvene Staff, MD;  Location: [facility];  Service: Gynecology;  Laterality: N/A;  . Breast biopsy Left 09/11/80  . Vaginal hysterectomy  12/09/2012    Complete hysterectomy - [place] placement Right 8/16  . Abdominal hysterectomy    . Lung biopsy  01/18/15  . Video bronchoscopy with endobronchial navigation N/A 02/04/2015    Procedure: VIDEO BRONCHOSCOPY WITH ENDOBRONCHIAL NAVIGATION;  Surgeon: [name], MD;  Location: [facility];  Service: Thoracic;  Laterality: N/A;   Family History  Problem Relation Age of Onset  . Breast cancer Mother 71  . Heart attack Mother 46  . Colon cancer Father 81  . Heart attack Father 41  . Breast cancer Sister 51  . Heart attack Paternal Grandfather 50  .  Breast cancer Other     mother's paternal first cousin dx in late 40s-early 69s   Social History  Substance Use Topics  . Smoking status: Never Smoker   . Smokeless tobacco: Never Used  . Alcohol Use: No    Review of Systems ROS: Statement: All systems negative except as marked or noted in the HPI; Constitutional: Negative for fever and chills. ; ; Eyes: Negative for eye pain, redness and discharge. ; ; ENMT: Negative for ear pain, hoarseness, nasal congestion, sinus pressure and sore throat. ; ; Cardiovascular: Negative for chest pain, palpitations, diaphoresis, and peripheral edema. ; ; Respiratory: +cough, SOB. Negative for wheezing and stridor. ; ; Gastrointestinal: Negative for nausea, vomiting, diarrhea, abdominal pain, blood in stool, hematemesis, jaundice and rectal bleeding. . ; ; Genitourinary: Negative for dysuria, flank pain and hematuria. ; ; Musculoskeletal: Negative for back pain and neck pain. Negative for swelling and trauma.; ; Skin: Negative for pruritus, rash, abrasions, blisters, bruising and skin lesion.; ; Neuro: Negative for headache, lightheadedness and neck stiffness. Negative for weakness, altered level of consciousness , altered mental status, extremity weakness, paresthesias, involuntary movement, seizure and syncope.      Allergies  Codeine; Acyclovir and related; Other; Tuberculin ppd; and Tylox  Home Medications   Prior to Admission medications   Medication Sig Start Date End Date Taking? Authorizing  Provider  acetaminophen (TYLENOL) 325 MG tablet Take 2 tablets (650 mg total) by mouth every 6 (six) hours as needed for fever, headache, mild pain or moderate pain. 02/05/15   Erin R Barrett, PA-C  alendronate (FOSAMAX) 70 MG tablet Take 70 mg by mouth once a week. Take with a full glass of water on an empty stomach. Monday    Historical Provider, MD  Calcium-Vitamin D (CALTRATE 600 PLUS-VIT D PO) Take 1 each by mouth 2 (two) times daily.     Historical Provider,  MD  cholecalciferol (VITAMIN D) 1000 UNITS tablet Take 1,000 Units by mouth daily.    Historical Provider, MD  folic acid (FOLVITE) 502 MCG tablet Take 800 mcg by mouth daily.     Historical Provider, MD  methotrexate (RHEUMATREX) 2.5 MG tablet Take 10 mg by mouth 2 (two) times a week. Caution:Chemotherapy. Protect from light.  Pt to takes 2.'5mg'$  tabs- 4 tablets on Friday and 4 tablets on Saturday    Historical Provider, MD  Multiple Vitamin (MULTIVITAMIN) tablet Take 1 tablet by mouth daily.    Historical Provider, MD  naproxen sodium (ANAPROX) 220 MG tablet Take 220 mg by mouth 2 (two) times daily with a meal.    Historical Provider, MD  predniSONE (DELTASONE) 5 MG tablet Take 5 mg by mouth daily as needed (flare up).     Historical Provider, MD  raloxifene (EVISTA) 60 MG tablet Take 60 mg by mouth daily.    Historical Provider, MD   BP 117/54 mmHg  Pulse 104  Temp(Src) 98 F (36.7 C) (Oral)  Resp 20  Ht '4\' 11"'$  (1.499 m)  Wt 112 lb (50.803 kg)  BMI 22.61 kg/m2  SpO2 95% Physical Exam  1835: Physical examination:  Nursing notes reviewed; Vital signs and O2 SAT reviewed;  Constitutional: Well developed, Well nourished, Well hydrated, In no acute distress; Head:  Normocephalic, atraumatic; Eyes: EOMI, PERRL, No scleral icterus; ENMT: Mouth and pharynx normal, Mucous membranes moist; Neck: Supple, Full range of motion, No lymphadenopathy; Cardiovascular: Tachycardic rate and rhythm, No gallop; Respiratory: Breath sounds coarse, diminished left, No wheezes.  Speaking full sentences with ease, Normal respiratory effort/excursion; Chest: Nontender, Movement normal; Abdomen: Soft, Nontender, Nondistended, Normal bowel sounds; Genitourinary: No CVA tenderness; Extremities: Pulses normal, No tenderness, No edema, No calf edema or asymmetry.; Neuro: AA&Ox3, Major CN grossly intact.  Speech clear. No gross focal motor or sensory deficits in extremities.; Skin: Color normal, Warm, Dry.   ED Course   Procedures (including critical care time) Labs Review  Imaging Review  I have personally reviewed and evaluated these images and lab results as part of my medical decision-making.   EKG Interpretation   Date/Time:  Wednesday February 06 2015 18:59:36 EST Ventricular Rate:  101 PR Interval:  139 QRS Duration: 78 QT Interval:  329 QTC Calculation: 426 R Axis:   75 Text Interpretation:  Sinus tachycardia Baseline wander When compared with  ECG of 11/17/2012 Rate faster Confirmed by [facility]  MD, Nunzio Cory [phone])  on 02/06/2015 7:17:01 PM      MDM  MDM Reviewed: previous chart, nursing note and vitals Reviewed previous: labs, ECG and x-ray Interpretation: labs, ECG, x-ray and CT scan     Results for orders placed or performed during the hospital encounter of 87/86/76  Basic metabolic panel  Result Value Ref Range   Sodium 137 135 - 145 mmol/L   Potassium 3.9 3.5 - 5.1 mmol/L   Chloride 104 101 - 111 mmol/L   CO2  26 22 - 32 mmol/L   Glucose, Bld 99 65 - 99 mg/dL   BUN 18 6 - 20 mg/dL   Creatinine, Ser 0.58 0.44 - 1.00 mg/dL   Calcium 8.6 (L) 8.9 - 10.3 mg/dL   GFR calc non Af Amer >60 >60 mL/min   GFR calc Af Amer >60 >60 mL/min   Anion gap 7 5 - 15  Brain natriuretic peptide  Result Value Ref Range   B Natriuretic Peptide 61.0 0.0 - 100.0 pg/mL  Troponin I  Result Value Ref Range   Troponin I <0.03 <0.031 ng/mL  CBC with Differential  Result Value Ref Range   WBC 7.8 4.0 - 10.5 K/uL   RBC 2.61 (L) 3.87 - 5.11 MIL/uL   Hemoglobin 9.4 (L) 12.0 - 15.0 g/dL   HCT 28.1 (L) 36.0 - 46.0 %   MCV 107.7 (H) 78.0 - 100.0 fL   MCH 36.0 (H) 26.0 - 34.0 pg   MCHC 33.5 30.0 - 36.0 g/dL   RDW 15.5 11.5 - 15.5 %   Platelets 220 150 - 400 K/uL   Neutrophils Relative % 60 %   Neutro Abs 4.7 1.7 - 7.7 K/uL   Lymphocytes Relative 20 %   Lymphs Abs 1.5 0.7 - 4.0 K/uL   Monocytes Relative 11 %   Monocytes Absolute 0.9 0.1 - 1.0 K/uL   Eosinophils Relative 9 %   Eosinophils  Absolute 0.7 0.0 - 0.7 K/uL   Basophils Relative 0 %   Basophils Absolute 0.0 0.0 - 0.1 K/uL  I-Stat CG4 Lactic Acid, ED  Result Value Ref Range   Lactic Acid, Venous 0.90 0.5 - 2.0 mmol/L   Dg Chest 2 View 02/06/2015  CLINICAL DATA:  70 year old female with increasing shortness of breath. History of lung biopsy 2 days ago. Nonproductive cough for the past 7 months. EXAM: CHEST  2 VIEW COMPARISON:  Chest x-ray 02/04/2014. FINDINGS: Large left pleural effusion. Multiple known pleural-based masses in the left hemithorax are difficult to discretely visualize on today's examination secondary to the large left pleural effusion (see chest CT 01/24/2015 for further details). Right lung is clear. No right pleural effusion. Small right-sided pneumothorax, slightly decreased in size compared with yesterday's examination. No evidence of pulmonary edema. Cardiac silhouette is largely obscured, but heart size appears normal. Mediastinal contours are distorted by patient's positioning. Atherosclerosis in the thoracic aorta. Right internal jugular single-lumen porta cath with tip terminating at the superior cavoatrial junction. IMPRESSION: 1. Slight decrease in pneumothorax component of left-sided hydropneumothorax compared to yesterday's examination. The appearance the chest is otherwise essentially unchanged, as discussed above. Electronically Signed   By: Vinnie Langton M.D.   On: 02/06/2015 18:48   Ct Angio Chest Pe W/cm &/or Wo Cm 02/06/2015  CLINICAL DATA:  Cough and shortness of breath for 2 days. Symptoms after having bronchoscopy. Patient with history of left breast cancer and endometrial cancer with lung metastasis. EXAM: CT ANGIOGRAPHY CHEST WITH CONTRAST TECHNIQUE: Multidetector CT imaging of the chest was performed using the standard protocol during bolus administration of intravenous contrast. Multiplanar CT image reconstructions and MIPs were obtained to evaluate the vascular anatomy. CONTRAST:  124m  OMNIPAQUE IOHEXOL 350 MG/ML SOLN COMPARISON:  Most recent chest CT 01/24/2015. Most recent chest radiograph earlier this day at 1824 hour FINDINGS: There are no filling defects within the pulmonary arteries to suggest pulmonary embolus, with limitations secondary to breathing motion artifact (particularly in the right lung base) and left lung atelectasis. A questionable filling defect  seen only on a single image series 10 image 163 is felt to be artifactual. Small left anterior apical pneumothorax. Left pleural effusion with increase from CT 13 days prior, now moderate in size. There is no definite pleural nodularity. Pleural effusion causes marked atelectasis in the left lower and to a lesser extent dependent left upper lobe. No right pleural effusion. The left paramediastinal masses have increased in size in the short interim, current measurement 6.0 x 9.0 cm (previously 7.0 x 4.7 cm), of the superior lesion; more inferior lesion also increased in size 6.4 x 5.0 cm (previously 5.2 x 3.2 cm). Masslike density between these 2 lesions has also increased. Right lower lobe pulmonary nodule measures 12 x 11 mm, previously 7 x 7 mm, with adjacent cystic space unchanged. There is mild rightward mediastinal shift, new from prior CT. No definite mediastinal adenopathy. No pericardial effusion. Esophagus is patulous and fluid-filled. Unchanged appearance of the upper abdomen. 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 No focal osseous lesions are seen. Review of the MIP images confirms the above findings. IMPRESSION: 1. No pulmonary embolus allowing for limitations secondary to breathing artifact and left pleural effusion. 2. Left hydro pneumothorax, with small pneumothorax component anterior apical and moderate hydrothorax. Pleural fluid measures simple fluid density. 3. Increased size of the left paramediastinal masses in the short interim (13 days). Right lower lobe 11 mm pulmonary nodule has also increased in the  interim. 4. There is mild rightward mediastinal shift from prior CT, unclear whether this is secondary to pleural effusion, left-sided pulmonary masses, or pneumothorax, though the relative size of pneumothorax component compared to pleural effusion argues against this as etiology. These results were called by telephone at the time of interpretation on 02/06/2015 at 9:24 pm to Dr. Francine Graven , who verbally acknowledged these results. Electronically Signed   By: Jeb Levering M.D.   On: 02/06/2015 21:26    2210:  Pt ambulated to bathroom, became increasingly SOB with O2 desat into 80's. Pt escorted back to stretcher, O2 N/C applied with O2 Sat increasing to 96% and pt stating she "felt better."  Dx and testing d/w pt and family.  Questions answered.  Verb understanding, agreeable to transfer to [facility] for admit. T/C to [facility] Dr. Cyndia Bent, case discussed, including:  HPI, pertinent PM/SHx, VS/PE, dx testing, ED course and treatment:  Pt needs effusion drained/likely needs pleurx catheter placed, [facility] will consult, requests to transfer to [facility] under [facility] service.  T/C to [facility] Dr. Marin Comment, case discussed, including:  HPI, pertinent PM/SHx, VS/PE, dx testing, ED course and treatment:  Agreeable to come to ED for evaluation to admit/transfer to [facility].      Francine Graven, DO 02/10/15 2104

## 2015-02-06 NOTE — ED Notes (Signed)
Pt states she had a lung biopsy on Monday. States she is more short of breath now with movement. Pt has a constant dry non productive cough

## 2015-02-06 NOTE — ED Notes (Signed)
Lactic acid 0.9, edp made aware

## 2015-02-06 NOTE — H&P (Addendum)
[facility] History and Physical  Alejandra Callahan HKV:[id number] DOB: 19-Jun-1945    PCP:   [name], MD   Chief Complaint: SOB  HPI: Alejandra Callahan is an 70 y.o. 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 Patient was discharged from hospital 1/31 s/p lung biopsy where she was noted to have a pneumothorax. Pneumothorax noted to be stable upon discharge. Per patient, coughing worsens when ambulating and trying to eat. Reports BLE swelling 1/31 as well as swelling of right hand that has resolved prior to admission. She denies any recent swallow studies or diet restriction. Denies CP/ palpitation, back pain, and abd pain. Denies any loss of appetite, N/V/D, and is afebrile. Is on chronic Prednisone PRN for rheumetoid arthritis, last dose was 1/31.  Workup revealed hgb 9.4.  Rewiew of Systems:  Constitutional: Negative for malaise, fever and chills. No significant weight loss or weight gain Eyes: Negative for eye pain, redness and discharge, diplopia, visual changes, or flashes of light. ENMT: Negative for ear pain, hoarseness, nasal congestion, sinus pressure and sore throat. No headaches; tinnitus, drooling. Positive for problems with swallowing.  Cardiovascular: Negative for chest pain, palpitations, diaphoresis, and peripheral edema. No orthopnea, PND. Positive for dyspnea. Respiratory: Negative for hemoptysis, wheezing and stridor. No pleuritic chestpain. Positive for cough Gastrointestinal: Negative for nausea, vomiting, diarrhea, constipation, abdominal pain, melena, blood in stool, hematemesis, jaundice and rectal bleeding.    Genitourinary: Negative for frequency, dysuria, incontinence,flank pain and hematuria; Musculoskeletal: Negative for back pain and neck pain. Negative for swelling and trauma.;  Skin: . Negative for  pruritus, rash, abrasions, bruising and skin lesion.; ulcerations Neuro: Negative for headache, lightheadedness and neck stiffness. Negative for weakness, altered level of consciousness , altered mental status, extremity weakness, burning feet, involuntary movement, seizure and syncope.  Psych: negative for anxiety, depression, insomnia, tearfulness, panic attacks, hallucinations, paranoia, suicidal or homicidal ideation    Past Medical History  Diagnosis Date  . Cancer (HCC)     breast  . Arthritis     Rheumatoid arthritis  . PONV (postoperative nausea and vomiting) 1982  . Breast cancer ([place]) 09/1980    left unilateral mastectomy, no chemo or radiatioin  . Endometrial cancer ([place]) 2014  . Lung mass   . Pneumonia     hx    Past Surgical History  Procedure Laterality Date  . Mastectomy Left 09/1980  . Tubal ligation  April 1982  . Dilatation & currettage/hysteroscopy with resectocope N/A 11/18/2012    Procedure: DILATATION & CURETTAGE/HYSTEROSCOPY WITH RESECTOCOPE;  Surgeon: Marvene Staff, MD;  Location: [facility];  Service: Gynecology;  Laterality: N/A;  . Breast biopsy Left 09/11/80  . Vaginal hysterectomy  12/09/2012    Complete hysterectomy - [place] placement Right 8/16  . Abdominal hysterectomy    . Lung biopsy  01/18/15  . Video bronchoscopy with endobronchial navigation N/A 02/04/2015    Procedure: VIDEO BRONCHOSCOPY WITH ENDOBRONCHIAL NAVIGATION;  Surgeon: [name], MD;  Location: [facility];  Service: Thoracic;  Laterality: N/A;    Medications:  HOME MEDS: Prior to Admission medications   Medication Sig Start Date End Date Taking? Authorizing Provider  acetaminophen (TYLENOL) 500 MG tablet Take 500 mg by mouth every 6 (six) hours as needed for mild pain or moderate pain.   Yes Historical Provider, MD  alendronate (FOSAMAX) 70 MG tablet Take 70 mg by mouth once a week. Take with a full glass of water on an empty stomach. Monday   Yes  Historical Provider, MD  Calcium-Vitamin D (CALTRATE 600 PLUS-VIT D PO) Take 1 each by mouth 2 (two) times daily.    Yes Historical Provider, MD  cholecalciferol (VITAMIN D) 1000 UNITS tablet Take 1,000 Units by mouth at bedtime.    Yes Historical Provider, MD  folic acid (FOLVITE) 161 MCG tablet Take 800 mcg by mouth daily.    Yes Historical Provider, MD  methotrexate (RHEUMATREX) 2.5 MG tablet Take 10 mg by mouth 2 (two) times a week. Caution:Chemotherapy. Protect from light.  Pt to takes 2.'5mg'$  tabs- 4 tablets on Friday and 4 tablets on Saturday   Yes Historical Provider, MD  Multiple Vitamin (MULTIVITAMIN) tablet Take 1 tablet by mouth daily.   Yes Historical Provider, MD  predniSONE (DELTASONE) 5 MG tablet Take 5 mg by mouth daily as needed (flare up).    Yes Historical Provider, MD  raloxifene (EVISTA) 60 MG tablet Take 60 mg by mouth daily.   Yes Historical Provider, MD     Allergies:  Allergies  Allergen Reactions  . Codeine Other (See Comments)    Blurred vision and dizziness blurred vision, dizzy, loss of hearing  . Acyclovir And Related Swelling, Dermatitis and Rash    fatigue  . Other     TB test- arm swells/fever to extremity  Tegaderm badage- pulls skin off, rash  . Tuberculin Ppd Other (See Comments)    Swelling and redness in arm  . Tylox [Oxycodone-Acetaminophen] Other (See Comments)    Blurred vision and dizziness Blurred vision, dizziness    Social History:   reports that she has never smoked. She has never used smokeless tobacco. She reports that she does not drink alcohol or use illicit drugs.  Family History: Family History  Problem Relation Age of Onset  . Breast cancer Mother 48  . Heart attack Mother 10  . Colon cancer Father 24  . Heart attack Father 67  . Breast cancer Sister 17  . Heart attack Paternal Grandfather 46  . Breast cancer Other     mother's paternal first cousin dx in late 10s-early 69s     Physical Exam: Filed Vitals:    02/06/15 2128 02/06/15 2130 02/06/15 2131 02/06/15 2200  BP:  121/67  109/69  Pulse: 104 103 104 104  Temp:      TempSrc:      Resp: '19 23 17 18  '$ Height:      Weight:      SpO2: 91% 96% 96% 97%   Blood pressure 109/69, pulse 104, temperature 98 F (36.7 C), temperature source Oral, resp. rate 18, height '4\' 11"'$  (1.499 m), weight 50.803 kg (112 lb), SpO2 97 %.  GEN:  Pleasant; cooperative with exam. PSYCH:  alert and oriented x4; does not appear anxious or depressed; affect is appropriate. HEENT: Mucous membranes pink and anicteric; PERRLA; EOM intact; no cervical lymphadenopathy nor thyromegaly or carotid bruit; no JVD; There were no stridor. Neck is very supple. Breasts:: Not examined CHEST WALL: No tenderness CHEST:  Decreased breath sound left side, no crackles, minimal wheezing HEART: Regular rate and rhythm.  There are no murmur, rub, or gallops.   BACK: No kyphosis or scoliosis; no CVA tenderness ABDOMEN: soft and non-tender; no masses, no organomegaly, normal abdominal bowel sounds; no pannus; no intertriginous candida. There is no rebound and no distention. Rectal  Exam: Not done EXTREMITIES: No bone or joint deformity; age-appropriate arthropathy of the hands and knees; no edema; no ulcerations.  There is no calf tenderness. Genitalia: not examined PULSES: 2+ and symmetric SKIN: Normal hydration no rash or ulceration CNS: Cranial nerves 2-12 grossly intact no focal lateralizing neurologic deficit.  Speech is fluent; uvula elevated with phonation, facial symmetry and tongue midline. DTR are normal bilaterally, cerebella exam is intact, barbinski is negative and strengths are equaled bilaterally.  No sensory loss.   Labs on Admission:  Basic Metabolic Panel:  Recent Labs Lab 02/01/15 1353 02/06/15 1918  NA 140 137  K 3.9 3.9  CL 106 104  CO2 28 26  GLUCOSE 90 99  BUN 18 18  CREATININE 0.59 0.58  CALCIUM 9.7 8.6*   Liver Function Tests:  Recent Labs Lab  02/01/15 1353  AST 22  ALT 16  ALKPHOS 64  BILITOT 0.3  PROT 5.8*  ALBUMIN 3.0*   CBC:  Recent Labs Lab 02/01/15 1306 02/06/15 1918  WBC 7.2 7.8  NEUTROABS  --  4.7  HGB 10.1* 9.4*  HCT 31.1* 28.1*  MCV 108.0* 107.7*  PLT 250 220   Cardiac Enzymes:  Recent Labs Lab 02/06/15 1918  TROPONINI <0.03      Radiological Exams on Admission: Dg Chest 2 View  02/06/2015  CLINICAL DATA:  70 year old female with increasing shortness of breath. History of lung biopsy 2 days ago. Nonproductive cough for the past 7 months. EXAM: CHEST  2 VIEW COMPARISON:  Chest x-ray 02/04/2014. FINDINGS: Large left pleural effusion. Multiple known pleural-based masses in the left hemithorax are difficult to discretely visualize on today's examination secondary to the large left pleural effusion (see chest CT 01/24/2015 for further details). Right lung is clear. No right pleural effusion. Small right-sided pneumothorax, slightly decreased in size compared with yesterday's examination. No evidence of pulmonary edema. Cardiac silhouette is largely obscured, but heart size appears normal. Mediastinal contours are distorted by patient's positioning. Atherosclerosis in the thoracic aorta. Right internal jugular single-lumen porta cath with tip terminating at the superior cavoatrial junction. IMPRESSION: 1. Slight decrease in pneumothorax component of left-sided hydropneumothorax compared to yesterday's examination. The appearance the chest is otherwise essentially unchanged, as discussed above. Electronically Signed   By: Vinnie Langton M.D.   On: 02/06/2015 18:48   Dg Chest 2 View  02/05/2015  CLINICAL DATA:  Pneumothorax followup EXAM: CHEST  2 VIEW COMPARISON:  02/04/2015 FINDINGS: Slight increase in left apical pneumothorax. No change in lingular mass and left effusion. Mild right lower lobe atelectasis also unchanged. Port-A-Cath tip in the SVC IMPRESSION: Slight increase in left apical pneumothorax No change  lingular mass and left effusion. Mild right lower lobe atelectasis also unchanged. Electronically Signed   By: Franchot Gallo M.D.   On: 02/05/2015 07:28   Ct Angio Chest Pe W/cm &/or Wo Cm  02/06/2015  CLINICAL DATA:  Cough and shortness of breath for 2 days. Symptoms after having bronchoscopy. Patient with history of left breast cancer and endometrial cancer with lung metastasis. EXAM: CT ANGIOGRAPHY CHEST WITH CONTRAST TECHNIQUE: Multidetector CT imaging of the chest was performed using the standard protocol during bolus administration of intravenous contrast. Multiplanar CT image reconstructions and MIPs were obtained to evaluate the vascular anatomy. CONTRAST:  123m OMNIPAQUE IOHEXOL 350 MG/ML SOLN COMPARISON:  Most recent chest CT 01/24/2015. Most recent chest radiograph earlier this day at 1824 hour FINDINGS: There are no filling defects within the pulmonary arteries to suggest pulmonary embolus,  with limitations secondary to breathing motion artifact (particularly in the right lung base) and left lung atelectasis. A questionable filling defect seen only on a single image series 10 image 163 is felt to be artifactual. Small left anterior apical pneumothorax. Left pleural effusion with increase from CT 13 days prior, now moderate in size. There is no definite pleural nodularity. Pleural effusion causes marked atelectasis in the left lower and to a lesser extent dependent left upper lobe. No right pleural effusion. The left paramediastinal masses have increased in size in the short interim, current measurement 6.0 x 9.0 cm (previously 7.0 x 4.7 cm), of the superior lesion; more inferior lesion also increased in size 6.4 x 5.0 cm (previously 5.2 x 3.2 cm). Masslike density between these 2 lesions has also increased. Right lower lobe pulmonary nodule measures 12 x 11 mm, previously 7 x 7 mm, with adjacent cystic space unchanged. There is mild rightward mediastinal shift, new from prior CT. No definite  mediastinal adenopathy. No pericardial effusion. Esophagus is patulous and fluid-filled. Unchanged appearance of the upper abdomen. 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 No focal osseous lesions are seen. Review of the MIP images confirms the above findings. IMPRESSION: 1. No pulmonary embolus allowing for limitations secondary to breathing artifact and left pleural effusion. 2. Left hydro pneumothorax, with small pneumothorax component anterior apical and moderate hydrothorax. Pleural fluid measures simple fluid density. 3. Increased size of the left paramediastinal masses in the short interim (13 days). Right lower lobe 11 mm pulmonary nodule has also increased in the interim. 4. There is mild rightward mediastinal shift from prior CT, unclear whether this is secondary to pleural effusion, left-sided pulmonary masses, or pneumothorax, though the relative size of pneumothorax component compared to pleural effusion argues against this as etiology. These results were called by telephone at the time of interpretation on 02/06/2015 at 9:24 pm to Dr. Francine Graven , who verbally acknowledged these results. Electronically Signed   By: Jeb Levering M.D.   On: 02/06/2015 21:26    EKG: Independently reviewed.    Assessment/Plan  1. SOB, secondary to pneumothorax and aspiration, though degree of pneumothorax is same, not much worsening.  Neither pneumothorax nor pleural effusion is causing acute decompensation, but I suspect her cause for aspiration and bronchospasm. Will initiate nebulizer and iv steroid and keep npo. Consult speech for recommendation to avoid further aspiration. Thoracic surgeon has been consulted and will be seen in morning for placement of a pleurovax for pleural effusion.  Pending biopsy result, will likely need radiation and chemotherapy.  2. Endometrial CA, followup as outpatient 3. Malignant neoplasm metastatic to lung CA, followup as outpatient 4. Pneumothorax with pleural  effusion, s/p biopsy. Stable on imaging. Will transfer to [place] thoracic surgery consutlation with regard to her pleural effusion.  5. Rheumatoid arthritis, currently taking Prednisone PRN with last known dose 1/31.  6. Possible adrenal insufficiency.  Will give Solucortef tonight, and IV solumedrol Q6 hours.   PLAN:  Transfer to [facility] for thoracic consultation.   Plan for Steroid, Nebulizer treatment, Cough suppressant  Plan for swallow evaluation  Follow-up as outpatient for CA  Other plans as per orders.  Code Status: Full DVT prophylaxis: heparin SQ.  Family Communication: Family at bedside; son, daughter, and son-in-law Disposition Plan: Transfer to [facility] hospitalist service   Orvan Falconer, MD. FACP [facility] Pager [phone] 7pm to 7am.  02/06/2015, 10:27 PM   By signing my name below, I, Delene Ruffini, attest that  this documentation has been prepared under the direction and in the presence of Orvan Falconer, MD. Electronically Signed: Delene Ruffini, Scribe 02/06/2015 10:35pm

## 2015-02-07 ENCOUNTER — Inpatient Hospital Stay (HOSPITAL_COMMUNITY): Payer: Medicare Other

## 2015-02-07 DIAGNOSIS — J90 Pleural effusion, not elsewhere classified: Secondary | ICD-10-CM

## 2015-02-07 LAB — URINALYSIS, ROUTINE W REFLEX MICROSCOPIC
BILIRUBIN URINE: NEGATIVE
Glucose, UA: 500 mg/dL — AB
HGB URINE DIPSTICK: NEGATIVE
KETONES UR: NEGATIVE mg/dL
Leukocytes, UA: NEGATIVE
NITRITE: NEGATIVE
PH: 6 (ref 5.0–8.0)
Protein, ur: NEGATIVE mg/dL
SPECIFIC GRAVITY, URINE: 1.007 (ref 1.005–1.030)

## 2015-02-07 LAB — CBC
HCT: 27.9 % — ABNORMAL LOW (ref 36.0–46.0)
HEMATOCRIT: 27.8 % — AB (ref 36.0–46.0)
HEMOGLOBIN: 9.4 g/dL — AB (ref 12.0–15.0)
Hemoglobin: 9.4 g/dL — ABNORMAL LOW (ref 12.0–15.0)
MCH: 35.9 pg — ABNORMAL HIGH (ref 26.0–34.0)
MCH: 35.7 pg — AB (ref 26.0–34.0)
MCHC: 33.8 g/dL (ref 30.0–36.0)
MCHC: 33.7 g/dL (ref 30.0–36.0)
MCV: 106.1 fL — ABNORMAL HIGH (ref 78.0–100.0)
MCV: 106.1 fL — ABNORMAL HIGH (ref 78.0–100.0)
PLATELETS: 268 10*3/uL (ref 150–400)
Platelets: 244 10*3/uL (ref 150–400)
RBC: 2.62 MIL/uL — ABNORMAL LOW (ref 3.87–5.11)
RBC: 2.63 MIL/uL — ABNORMAL LOW (ref 3.87–5.11)
RDW: 15.7 % — AB (ref 11.5–15.5)
RDW: 15.4 % (ref 11.5–15.5)
WBC: 7.2 10*3/uL (ref 4.0–10.5)
WBC: 8.4 10*3/uL (ref 4.0–10.5)

## 2015-02-07 LAB — TYPE AND SCREEN
ABO/RH(D): O POS
ANTIBODY SCREEN: NEGATIVE

## 2015-02-07 LAB — BLOOD GAS, ARTERIAL
ACID-BASE DEFICIT: 2.1 mmol/L — AB (ref 0.0–2.0)
Bicarbonate: 21.2 mEq/L (ref 20.0–24.0)
DRAWN BY: 225631
FIO2: 0.21
O2 Saturation: 94.4 %
PATIENT TEMPERATURE: 98.6
PCO2 ART: 30.3 mmHg — AB (ref 35.0–45.0)
PH ART: 7.46 — AB (ref 7.350–7.450)
TCO2: 22.1 mmol/L (ref 0–100)
pO2, Arterial: 71 mmHg — ABNORMAL LOW (ref 80.0–100.0)

## 2015-02-07 LAB — ABO/RH: ABO/RH(D): O POS

## 2015-02-07 LAB — PROTIME-INR
INR: 1.12 (ref 0.00–1.49)
PROTHROMBIN TIME: 14.6 s (ref 11.6–15.2)

## 2015-02-07 LAB — CREATININE, SERUM: Creatinine, Ser: 0.54 mg/dL (ref 0.44–1.00)

## 2015-02-07 LAB — TSH: TSH: 0.769 u[IU]/mL (ref 0.350–4.500)

## 2015-02-07 LAB — APTT: aPTT: 24 seconds (ref 24–37)

## 2015-02-07 MED ORDER — ALBUTEROL SULFATE (2.5 MG/3ML) 0.083% IN NEBU: 2.5000 mg | INHALATION_SOLUTION | RESPIRATORY_TRACT | Status: DC | PRN

## 2015-02-07 MED ORDER — DEXTROSE-NACL 5-0.9 % IV SOLN
INTRAVENOUS | Status: DC
  Administered 2015-02-07: 03:00:00 via INTRAVENOUS

## 2015-02-07 MED ORDER — VANCOMYCIN HCL IN DEXTROSE 1-5 GM/200ML-% IV SOLN
1000.0000 mg | INTRAVENOUS | Status: AC
  Administered 2015-02-08: 1000 mg via INTRAVENOUS
  Filled 2015-02-07 (×2): qty 200

## 2015-02-07 MED ORDER — ADULT MULTIVITAMIN W/MINERALS CH
1.0000 | ORAL_TABLET | Freq: Every day | ORAL | Status: DC
  Administered 2015-02-07 – 2015-02-14 (×7): 1 via ORAL
  Filled 2015-02-07 (×7): qty 1

## 2015-02-07 MED ORDER — SODIUM CHLORIDE 0.9% FLUSH
3.0000 mL | Freq: Two times a day (BID) | INTRAVENOUS | Status: DC
  Administered 2015-02-07 – 2015-02-12 (×8): 3 mL via INTRAVENOUS

## 2015-02-07 MED ORDER — FOLIC ACID 1 MG PO TABS
1.0000 mg | ORAL_TABLET | Freq: Every day | ORAL | Status: DC
  Administered 2015-02-07 – 2015-02-14 (×7): 1 mg via ORAL
  Filled 2015-02-07 (×7): qty 1

## 2015-02-07 MED ORDER — METHYLPREDNISOLONE SODIUM SUCC 40 MG IJ SOLR
40.0000 mg | Freq: Two times a day (BID) | INTRAMUSCULAR | Status: DC
  Administered 2015-02-08 – 2015-02-09 (×2): 40 mg via INTRAVENOUS
  Filled 2015-02-07 (×3): qty 1

## 2015-02-07 MED ORDER — ALBUTEROL SULFATE (2.5 MG/3ML) 0.083% IN NEBU
2.5000 mg | INHALATION_SOLUTION | Freq: Four times a day (QID) | RESPIRATORY_TRACT | Status: DC
  Administered 2015-02-07: 2.5 mg via RESPIRATORY_TRACT
  Filled 2015-02-07: qty 3

## 2015-02-07 MED ORDER — ONDANSETRON HCL 4 MG PO TABS: 4.0000 mg | ORAL_TABLET | Freq: Four times a day (QID) | ORAL | Status: DC | PRN

## 2015-02-07 MED ORDER — ALBUTEROL SULFATE (2.5 MG/3ML) 0.083% IN NEBU
2.5000 mg | INHALATION_SOLUTION | Freq: Two times a day (BID) | RESPIRATORY_TRACT | Status: DC
  Administered 2015-02-07 – 2015-02-11 (×7): 2.5 mg via RESPIRATORY_TRACT
  Filled 2015-02-07 (×7): qty 3

## 2015-02-07 MED ORDER — RALOXIFENE HCL 60 MG PO TABS
60.0000 mg | ORAL_TABLET | Freq: Every day | ORAL | Status: DC
  Administered 2015-02-07 – 2015-02-14 (×7): 60 mg via ORAL
  Filled 2015-02-07 (×8): qty 1

## 2015-02-07 MED ORDER — INFLUENZA VAC SPLIT QUAD 0.5 ML IM SUSY
0.5000 mL | PREFILLED_SYRINGE | Freq: Once | INTRAMUSCULAR | Status: DC
  Filled 2015-02-07: qty 0.5

## 2015-02-07 MED ORDER — METHYLPREDNISOLONE SODIUM SUCC 40 MG IJ SOLR
40.0000 mg | Freq: Four times a day (QID) | INTRAMUSCULAR | Status: DC
  Administered 2015-02-07 (×3): 40 mg via INTRAVENOUS
  Filled 2015-02-07 (×3): qty 1

## 2015-02-07 MED ORDER — HEPARIN SODIUM (PORCINE) 5000 UNIT/ML IJ SOLN
5000.0000 [IU] | Freq: Three times a day (TID) | INTRAMUSCULAR | Status: DC
  Administered 2015-02-07 (×3): 5000 [IU] via SUBCUTANEOUS
  Filled 2015-02-07 (×2): qty 1

## 2015-02-07 MED ORDER — ONDANSETRON HCL 4 MG/2ML IJ SOLN
4.0000 mg | Freq: Four times a day (QID) | INTRAMUSCULAR | Status: DC | PRN
  Administered 2015-02-11 (×2): 4 mg via INTRAVENOUS
  Filled 2015-02-07 (×2): qty 2

## 2015-02-07 NOTE — Progress Notes (Signed)
UR Completed. Makayle Krahn, RN, BSN.  [phone] 

## 2015-02-07 NOTE — Progress Notes (Signed)
[facility] PROGRESS NOTE  Alejandra Callahan FWY:[id number] DOB: 09-25-1945 DOA: 02/06/2015 PCP: [name], MD  Assessment/Plan: Alejandra Callahan is an 70 y.o. 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 Patient was discharged from hospital 1/31 s/p lung biopsy where she was noted to have a pneumothorax. Pneumothorax noted to be stable upon discharge. Per patient, coughing worsens when ambulating and trying to eat. Reports BLE swelling 1/31 as well as swelling of right hand that has resolved prior to admission. She denies any recent swallow studies or diet restriction. Denies CP/ palpitation, back pain, and abd pain. Denies any loss of appetite, N/V/D, and is afebrile. Is on chronic Prednisone PRN for rheumetoid arthritis, last dose was 1/31.  Workup revealed hgb 9.4.  1-Acute hypoxic Respiratory failure , pneumothorax, hydropneumothorax   Presents with worsening cough and dyspnea.  CVTS consulted.  CT negative for PE, hydrothorax.  Nebulizer.   Endometrial CA; follow oncology Dr Whitney Muse.   Malignant neoplasm metastatic to lung CA;  Pneumothorax with pleural effusion, s/p biopsy. Awaiting Dr Princess Perna recommendation.   Rheumatoid arthritis, currently taking Prednisone  Possible adrenal insufficiency.  Taper IV solumedrol.   Code Status: full code.  Family Communication: care discussed with patient  Disposition Plan: remain inpatient    Consultants:  Dr Princess Perna   Procedures:  none  Antibiotics:  none  HPI/Subjective: Breathing ok, but was having trouble last night   Objective: Filed Vitals:   02/07/15 0627 02/07/15 1100  BP: 100/57 101/52  Pulse: 95 95  Temp: 98.1 F (36.7 C) 98 F (36.7 C)  Resp: 20 20    Intake/Output Summary (Last 24 hours) at 02/07/15 1344 Last data filed at 02/07/15 1302  Gross  per 24 hour  Intake    240 ml  Output      0 ml  Net    240 ml   Filed Weights   02/06/15 1822 02/07/15 0220  Weight: 50.803 kg (112 lb) 52.5 kg (115 lb 11.9 oz)    Exam:   General:  NAD  Cardiovascular: S 1, s 2 RRR  Respiratory: decrease breath sound, crackles bl  Abdomen: bs present, soft,   Musculoskeletal: no edema  Data Reviewed: Basic Metabolic Panel:  Recent Labs Lab 02/01/15 1353 02/06/15 1918 02/07/15 0256  NA 140 137  --   K 3.9 3.9  --   CL 106 104  --   CO2 28 26  --   GLUCOSE 90 99  --   BUN 18 18  --   CREATININE 0.59 0.58 0.54  CALCIUM 9.7 8.6*  --    Liver Function Tests:  Recent Labs Lab 02/01/15 1353  AST 22  ALT 16  ALKPHOS 64  BILITOT 0.3  PROT 5.8*  ALBUMIN 3.0*   No results for input(s): LIPASE, AMYLASE in the last 168 hours. No results for input(s): AMMONIA in the last 168 hours. CBC:  Recent Labs Lab 02/01/15 1306 02/06/15 1918 02/07/15 0256  WBC 7.2 7.8 7.2  NEUTROABS  --  4.7  --   HGB 10.1* 9.4* 9.4*  HCT 31.1* 28.1* 27.8*  MCV 108.0* 107.7* 106.1*  PLT 250 220 244   Cardiac Enzymes:  Recent Labs Lab 02/06/15 1918  TROPONINI <0.03   BNP (last 3 results)  Recent Labs  02/06/15 1918  BNP 61.0  ProBNP (last 3 results) No results for input(s): PROBNP in the last 8760 hours.  CBG: No results for input(s): GLUCAP in the last 168 hours.  No results found for this or any previous visit (from the past 240 hour(s)).   Studies: Dg Chest 2 View  02/06/2015  CLINICAL DATA:  70 year old female with increasing shortness of breath. History of lung biopsy 2 days ago. Nonproductive cough for the past 7 months. EXAM: CHEST  2 VIEW COMPARISON:  Chest x-ray 02/04/2014. FINDINGS: Large left pleural effusion. Multiple known pleural-based masses in the left hemithorax are difficult to discretely visualize on today's examination secondary to the large left pleural effusion (see chest CT 01/24/2015 for further details).  Right lung is clear. No right pleural effusion. Small right-sided pneumothorax, slightly decreased in size compared with yesterday's examination. No evidence of pulmonary edema. Cardiac silhouette is largely obscured, but heart size appears normal. Mediastinal contours are distorted by patient's positioning. Atherosclerosis in the thoracic aorta. Right internal jugular single-lumen porta cath with tip terminating at the superior cavoatrial junction. IMPRESSION: 1. Slight decrease in pneumothorax component of left-sided hydropneumothorax compared to yesterday's examination. The appearance the chest is otherwise essentially unchanged, as discussed above. Electronically Signed   By: Vinnie Langton M.D.   On: 02/06/2015 18:48   Ct Angio Chest Pe W/cm &/or Wo Cm  02/06/2015  CLINICAL DATA:  Cough and shortness of breath for 2 days. Symptoms after having bronchoscopy. Patient with history of left breast cancer and endometrial cancer with lung metastasis. EXAM: CT ANGIOGRAPHY CHEST WITH CONTRAST TECHNIQUE: Multidetector CT imaging of the chest was performed using the standard protocol during bolus administration of intravenous contrast. Multiplanar CT image reconstructions and MIPs were obtained to evaluate the vascular anatomy. CONTRAST:  178m OMNIPAQUE IOHEXOL 350 MG/ML SOLN COMPARISON:  Most recent chest CT 01/24/2015. Most recent chest radiograph earlier this day at 1824 hour FINDINGS: There are no filling defects within the pulmonary arteries to suggest pulmonary embolus, with limitations secondary to breathing motion artifact (particularly in the right lung base) and left lung atelectasis. A questionable filling defect seen only on a single image series 10 image 163 is felt to be artifactual. Small left anterior apical pneumothorax. Left pleural effusion with increase from CT 13 days prior, now moderate in size. There is no definite pleural nodularity. Pleural effusion causes marked atelectasis in the left lower  and to a lesser extent dependent left upper lobe. No right pleural effusion. The left paramediastinal masses have increased in size in the short interim, current measurement 6.0 x 9.0 cm (previously 7.0 x 4.7 cm), of the superior lesion; more inferior lesion also increased in size 6.4 x 5.0 cm (previously 5.2 x 3.2 cm). Masslike density between these 2 lesions has also increased. Right lower lobe pulmonary nodule measures 12 x 11 mm, previously 7 x 7 mm, with adjacent cystic space unchanged. There is mild rightward mediastinal shift, new from prior CT. No definite mediastinal adenopathy. No pericardial effusion. Esophagus is patulous and fluid-filled. Unchanged appearance of the upper abdomen. 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 No focal osseous lesions are seen. Review of the MIP images confirms the above findings. IMPRESSION: 1. No pulmonary embolus allowing for limitations secondary to breathing artifact and left pleural effusion. 2. Left hydro pneumothorax, with small pneumothorax component anterior apical and moderate hydrothorax. Pleural fluid measures simple fluid density. 3. Increased size of the left paramediastinal masses in the short interim (13 days). Right lower lobe 11 mm pulmonary nodule has  also increased in the interim. 4. There is mild rightward mediastinal shift from prior CT, unclear whether this is secondary to pleural effusion, left-sided pulmonary masses, or pneumothorax, though the relative size of pneumothorax component compared to pleural effusion argues against this as etiology. These results were called by telephone at the time of interpretation on 02/06/2015 at 9:24 pm to Dr. Francine Graven , who verbally acknowledged these results. Electronically Signed   By: Jeb Levering M.D.   On: 02/06/2015 21:26   Dg Swallowing Func-speech Pathology  02/07/2015  Objective Swallowing Evaluation: Type of Study: MBS-Modified Barium Swallow Study Patient Details Name: Alejandra Callahan MRN:  [id number] Date of Birth: 1945-09-06 Today's Date: 02/07/2015 Time: SLP Start Time (ACUTE ONLY): 0948-SLP Stop Time (ACUTE ONLY): 1000 SLP Time Calculation (min) (ACUTE ONLY): 12 min Past Medical History: Past Medical History Diagnosis Date . Cancer (HCC)    breast . Arthritis    Rheumatoid arthritis . PONV (postoperative nausea and vomiting) 1982 . Breast cancer ([place]) 09/1980   left unilateral mastectomy, no chemo or radiatioin . Endometrial cancer ([place]) 2014 . Lung mass  . Pneumonia    hx Past Surgical History: Past Surgical History Procedure Laterality Date . Mastectomy Left 09/1980 . Tubal ligation  April 1982 . Dilatation & currettage/hysteroscopy with resectocope N/A 11/18/2012   Procedure: DILATATION & CURETTAGE/HYSTEROSCOPY WITH RESECTOCOPE;  Surgeon: Marvene Staff, MD;  Location: [facility];  Service: Gynecology;  Laterality: N/A; . Breast biopsy Left 09/11/80 . Vaginal hysterectomy  12/09/2012   Complete hysterectomy - [place] placement Right 8/16 . Abdominal hysterectomy   . Lung biopsy  01/18/15 . Video bronchoscopy with endobronchial navigation N/A 02/04/2015   Procedure: VIDEO BRONCHOSCOPY WITH ENDOBRONCHIAL NAVIGATION;  Surgeon: [name], MD;  Location: [facility];  Service: Thoracic;  Laterality: N/A; HPI: Alejandra Callahan is an 70 y.o. 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 Patient was discharged from hospital 1/31 s/p lung biopsy where she was noted to have a pneumothorax. Pneumothorax noted to be stable upon discharge. Per patient, coughing worsens when ambulating and trying to eat. MD questioning aspiration and bronchospasm as cause of acute decompensation.  No Data Recorded Assessment / Plan / Recommendation CHL IP CLINICAL IMPRESSIONS 02/07/2015 Therapy Diagnosis WFL Clinical Impression Patient presents with largely normal oropharyngeal swallowing function with intermittent delay in swallow initiation but  with good airway protection . No frank penetration or aspiration observed. Esophageal sweep revealed no significant abnormalities, possible mild stasis in lower esophagus however MD not present to confirm.  Impact on safety and function Mild aspiration risk   CHL IP TREATMENT RECOMMENDATION 02/07/2015 Treatment Recommendations No treatment recommended at this time   No flowsheet data found. CHL IP DIET RECOMMENDATION 02/07/2015 SLP Diet Recommendations Regular solids;Thin liquid Liquid Administration via Cup;Straw Medication Administration Whole meds with liquid Compensations Slow rate;Small sips/bites Postural Changes Remain semi-upright after after feeds/meals (Comment);Seated upright at 90 degrees   CHL IP OTHER RECOMMENDATIONS 02/07/2015 Recommended Consults -- Oral Care Recommendations Oral care BID Other Recommendations --   CHL IP FOLLOW UP RECOMMENDATIONS 02/07/2015 Follow up Recommendations None   No flowsheet data found.     CHL IP ORAL PHASE 02/07/2015 Oral Phase WFL Oral - Pudding Teaspoon -- Oral - Pudding Cup -- Oral - Honey Teaspoon -- Oral - Honey Cup -- Oral - Nectar Teaspoon -- Oral - Nectar Cup -- Oral - Nectar Straw -- Oral -  Thin Teaspoon -- Oral - Thin Cup -- Oral - Thin Straw -- Oral - Puree -- Oral - Mech Soft -- Oral - Regular -- Oral - Multi-Consistency -- Oral - Pill -- Oral Phase - Comment --  CHL IP PHARYNGEAL PHASE 02/07/2015 Pharyngeal Phase WFL Pharyngeal- Pudding Teaspoon -- Pharyngeal -- Pharyngeal- Pudding Cup -- Pharyngeal -- Pharyngeal- Honey Teaspoon -- Pharyngeal -- Pharyngeal- Honey Cup -- Pharyngeal -- Pharyngeal- Nectar Teaspoon -- Pharyngeal -- Pharyngeal- Nectar Cup -- Pharyngeal -- Pharyngeal- Nectar Straw -- Pharyngeal -- Pharyngeal- Thin Teaspoon -- Pharyngeal -- Pharyngeal- Thin Cup -- Pharyngeal -- Pharyngeal- Thin Straw -- Pharyngeal -- Pharyngeal- Puree -- Pharyngeal -- Pharyngeal- Mechanical Soft -- Pharyngeal -- Pharyngeal- Regular -- Pharyngeal -- Pharyngeal-  Multi-consistency -- Pharyngeal -- Pharyngeal- Pill -- Pharyngeal -- Pharyngeal Comment --  CHL IP CERVICAL ESOPHAGEAL PHASE 02/07/2015 Cervical Esophageal Phase WFL Pudding Teaspoon -- Pudding Cup -- Honey Teaspoon -- Honey Cup -- Nectar Teaspoon -- Nectar Cup -- Nectar Straw -- Thin Teaspoon -- Thin Cup -- Thin Straw -- Puree -- Mechanical Soft -- Regular -- Multi-consistency -- Pill -- Cervical Esophageal Comment -- No flowsheet data found. Gabriel Rainwater MA, CCC-SLP [phone] [place] Leah Meryl 02/07/2015, 12:28 PM               Scheduled Meds: . albuterol  2.5 mg Nebulization BID  . benzonatate  200 mg Oral TID  . folic acid  1 mg Oral Daily  . heparin  5,000 Units Subcutaneous 3 times per day  . Influenza vac split quadrivalent PF  0.5 mL Intramuscular Once  . methylPREDNISolone (SOLU-MEDROL) injection  40 mg Intravenous 4 times per day  . multivitamin with minerals  1 tablet Oral Daily  . raloxifene  60 mg Oral Daily  . sodium chloride flush  3 mL Intravenous Q12H   Continuous Infusions: . dextrose 5 % and 0.9% NaCl 50 mL/hr at 02/07/15 0232    Active Problems:   SOB (shortness of breath)    Time spent: 35 minutes.     Niel Hummer A  [facility] Pager [phone]. If 7PM-7AM, please contact night-coverage at [email or web address], password [facility] 02/07/2015, 1:44 PM  LOS: 1 day

## 2015-02-07 NOTE — Anesthesia Preprocedure Evaluation (Addendum)
Anesthesia Evaluation  Patient identified by MRN, date of birth, ID band Patient awake    Reviewed: Allergy & Precautions, NPO status , Patient's Chart, lab work & pertinent test results  History of Anesthesia Complications (+) PONV and history of anesthetic complications  Airway Mallampati: II  TM Distance: >3 FB Neck ROM: Full    Dental  (+) Teeth Intact   Pulmonary shortness of breath,    breath sounds clear to auscultation       Cardiovascular negative cardio ROS   Rhythm:Regular Rate:Normal     Neuro/Psych negative neurological ROS  negative psych ROS   GI/Hepatic negative GI ROS, Neg liver ROS,   Endo/Other  negative endocrine ROS  Renal/GU negative Renal ROS  negative genitourinary   Musculoskeletal  (+) Arthritis ,   Abdominal   Peds negative pediatric ROS (+)  Hematology negative hematology ROS (+)   Anesthesia Other Findings - Endometrial Cancer - Breast Cancer  Reproductive/Obstetrics negative OB ROS                            Lab Results  Component Value Date   WBC 7.2 02/07/2015   HGB 9.4* 02/07/2015   HCT 27.8* 02/07/2015   MCV 106.1* 02/07/2015   PLT 244 02/07/2015   Lab Results  Component Value Date   CREATININE 0.54 02/07/2015   BUN 18 02/06/2015   NA 137 02/06/2015   K 3.9 02/06/2015   CL 104 02/06/2015   CO2 26 02/06/2015   Lab Results  Component Value Date   INR 1.09 02/04/2015   INR 1.10 01/18/2015   INR 1.07 08/06/2014   02/2015 EKG: Sinus Tachycardia   Anesthesia Physical Anesthesia Plan  ASA: II  Anesthesia Plan: General   Post-op Pain Management:    Induction: Intravenous  Airway Management Planned: Double Lumen EBT  Additional Equipment: Arterial line  Intra-op Plan:   Post-operative Plan: Extubation in OR  Informed Consent: I have reviewed the patients History and Physical, chart, labs and discussed the procedure including  the risks, benefits and alternatives for the proposed anesthesia with the patient or authorized representative who has indicated his/her understanding and acceptance.   Dental advisory given  Plan Discussed with: CRNA  Anesthesia Plan Comments: (Possible CVC)       Anesthesia Quick Evaluation

## 2015-02-07 NOTE — Progress Notes (Signed)
MBSS complete. Full report located under chart review in imaging section.  Talissa Apple MA, CCC-SLP [phone]   

## 2015-02-07 NOTE — Consult Note (Signed)
Reason for Consult:Left hydropneumothorax Referring Physician: Dr. Hall/ Dr. Stevphen Alejandra Callahan is an 70 y.o. female.  HPI: Alejandra Callahan is a  70 year old woman with a complex medical history including rheumatoid arthritis, breast cancer in 1982, endometrial cancer initially treated in 2014 with a recurrence with lung metastases in the summer of 2016. In June 2016 she noted a significant cough. She was found to have multiple bilateral lung metastases (9 in all). She was treated with carboplatin and Taxol with a good response. However, on a PET CT done in December there was a marked increase in size of a left lingular mass. She was treated with antibiotics and had a follow-up PET in January which showed a marked increase in size of 2 adjacent masses in the lingular segment of the left upper lobe. A CT-guided needle biopsy was done, but showed only necrosis.  I did a navigational bronch with brushings and biopsies on Monday. She developed a left pneumothorax during the procedure. She was not symptomatic. It remained stable the following day and we discharged her to home. Over the next 24 hours she had progressively worsening dyspnea. She went to the ED at [facility] and was transferred to [facility]. She is not SOB at rest.  Pathology from bronch showed atypical cells highly suspicious for, but not diagnostic of, malignancy.   Past Medical History  Diagnosis Date  . Cancer (HCC)     breast  . Arthritis     Rheumatoid arthritis  . PONV (postoperative nausea and vomiting) 1982  . Breast cancer ([place]) 09/1980    left unilateral mastectomy, no chemo or radiatioin  . Endometrial cancer ([place]) 2014  . Lung mass   . Pneumonia     hx    Past Surgical History  Procedure Laterality Date  . Mastectomy Left 09/1980  . Tubal ligation  April 1982  . Dilatation & currettage/hysteroscopy with resectocope N/A 11/18/2012    Procedure: DILATATION & CURETTAGE/HYSTEROSCOPY WITH RESECTOCOPE;  Surgeon: Marvene Staff, MD;  Location: [facility];  Service: Gynecology;  Laterality: N/A;  . Breast biopsy Left 09/11/80  . Vaginal hysterectomy  12/09/2012    Complete hysterectomy - [place] placement Right 8/16  . Abdominal hysterectomy    . Lung biopsy  01/18/15  . Video bronchoscopy with endobronchial navigation N/A 02/04/2015    Procedure: VIDEO BRONCHOSCOPY WITH ENDOBRONCHIAL NAVIGATION;  Surgeon: [name], MD;  Location: [facility] OR;  Service: Thoracic;  Laterality: N/A;    Family History  Problem Relation Age of Onset  . Breast cancer Mother 70  . Heart attack Mother 49  . Colon cancer Father 11  . Heart attack Father 44  . Breast cancer Sister 48  . Heart attack Paternal Grandfather 72  . Breast cancer Other     mother's paternal first cousin dx in late 40s-early 69s    Social History:  reports that she has never smoked. She has never used smokeless tobacco. She reports that she does not drink alcohol or use illicit drugs.  Allergies:  Allergies  Allergen Reactions  . Codeine Other (See Comments)    Blurred vision and dizziness blurred vision, dizzy, loss of hearing  . Acyclovir And Related Swelling, Dermatitis and Rash    fatigue  . Other     TB test- arm swells/fever to extremity  Tegaderm badage- pulls skin off, rash  . Tuberculin Ppd Other (See Comments)    Swelling and redness in arm  .  Tylox [Oxycodone-Acetaminophen] Other (See Comments)    Blurred vision and dizziness Blurred vision, dizziness    Medications:  Scheduled: . albuterol  2.5 mg Nebulization BID  . benzonatate  200 mg Oral TID  . folic acid  1 mg Oral Daily  . heparin  5,000 Units Subcutaneous 3 times per day  . Influenza vac split quadrivalent PF  0.5 mL Intramuscular Once  . [START ON 02/08/2015] methylPREDNISolone (SOLU-MEDROL) injection  40 mg Intravenous Q12H  . multivitamin with minerals  1 tablet Oral Daily  . raloxifene  60 mg Oral Daily  . sodium chloride flush  3 mL  Intravenous Q12H    Results for orders placed or performed during the hospital encounter of 02/06/15 (from the past 48 hour(s))  Basic metabolic panel     Status: Abnormal   Collection Time: 02/06/15  7:18 PM  Result Value Ref Range   Sodium 137 135 - 145 mmol/L   Potassium 3.9 3.5 - 5.1 mmol/L   Chloride 104 101 - 111 mmol/L   CO2 26 22 - 32 mmol/L   Glucose, Bld 99 65 - 99 mg/dL   BUN 18 6 - 20 mg/dL   Creatinine, Ser 0.58 0.44 - 1.00 mg/dL   Calcium 8.6 (L) 8.9 - 10.3 mg/dL   GFR calc non Af Amer >60 >60 mL/min   GFR calc Af Amer >60 >60 mL/min    Comment: (NOTE) The eGFR has been calculated using the CKD EPI equation. This calculation has not been validated in all clinical situations. eGFR's persistently <60 mL/min signify possible Chronic Kidney Disease.    Anion gap 7 5 - 15  Brain natriuretic peptide     Status: None   Collection Time: 02/06/15  7:18 PM  Result Value Ref Range   B Natriuretic Peptide 61.0 0.0 - 100.0 pg/mL  Troponin I     Status: None   Collection Time: 02/06/15  7:18 PM  Result Value Ref Range   Troponin I <0.03 <0.031 ng/mL    Comment:        NO INDICATION OF MYOCARDIAL INJURY.   CBC with Differential     Status: Abnormal   Collection Time: 02/06/15  7:18 PM  Result Value Ref Range   WBC 7.8 4.0 - 10.5 K/uL   RBC 2.61 (L) 3.87 - 5.11 MIL/uL   Hemoglobin 9.4 (L) 12.0 - 15.0 g/dL   HCT 28.1 (L) 36.0 - 46.0 %   MCV 107.7 (H) 78.0 - 100.0 fL   MCH 36.0 (H) 26.0 - 34.0 pg   MCHC 33.5 30.0 - 36.0 g/dL   RDW 15.5 11.5 - 15.5 %   Platelets 220 150 - 400 K/uL   Neutrophils Relative % 60 %   Neutro Abs 4.7 1.7 - 7.7 K/uL   Lymphocytes Relative 20 %   Lymphs Abs 1.5 0.7 - 4.0 K/uL   Monocytes Relative 11 %   Monocytes Absolute 0.9 0.1 - 1.0 K/uL   Eosinophils Relative 9 %   Eosinophils Absolute 0.7 0.0 - 0.7 K/uL   Basophils Relative 0 %   Basophils Absolute 0.0 0.0 - 0.1 K/uL  I-Stat CG4 Lactic Acid, ED     Status: None   Collection Time:  02/06/15  7:27 PM  Result Value Ref Range   Lactic Acid, Venous 0.90 0.5 - 2.0 mmol/L  Lactic acid, plasma     Status: None   Collection Time: 02/06/15 10:15 PM  Result Value Ref Range   Lactic Acid, Venous  0.8 0.5 - 2.0 mmol/L  CBC     Status: Abnormal   Collection Time: 02/07/15  2:56 AM  Result Value Ref Range   WBC 7.2 4.0 - 10.5 K/uL   RBC 2.62 (L) 3.87 - 5.11 MIL/uL   Hemoglobin 9.4 (L) 12.0 - 15.0 g/dL   HCT 27.8 (L) 36.0 - 46.0 %   MCV 106.1 (H) 78.0 - 100.0 fL   MCH 35.9 (H) 26.0 - 34.0 pg   MCHC 33.8 30.0 - 36.0 g/dL   RDW 15.7 (H) 11.5 - 15.5 %   Platelets 244 150 - 400 K/uL  Creatinine, serum     Status: None   Collection Time: 02/07/15  2:56 AM  Result Value Ref Range   Creatinine, Ser 0.54 0.44 - 1.00 mg/dL   GFR calc non Af Amer >60 >60 mL/min   GFR calc Af Amer >60 >60 mL/min    Comment: (NOTE) The eGFR has been calculated using the CKD EPI equation. This calculation has not been validated in all clinical situations. eGFR's persistently <60 mL/min signify possible Chronic Kidney Disease.   TSH     Status: None   Collection Time: 02/07/15  2:56 AM  Result Value Ref Range   TSH 0.769 0.350 - 4.500 uIU/mL    Dg Chest 2 View  02/06/2015  CLINICAL DATA:  70 year old female with increasing shortness of breath. History of lung biopsy 2 days ago. Nonproductive cough for the past 7 months. EXAM: CHEST  2 VIEW COMPARISON:  Chest x-ray 02/04/2014. FINDINGS: Large left pleural effusion. Multiple known pleural-based masses in the left hemithorax are difficult to discretely visualize on today's examination secondary to the large left pleural effusion (see chest CT 01/24/2015 for further details). Right lung is clear. No right pleural effusion. Small right-sided pneumothorax, slightly decreased in size compared with yesterday's examination. No evidence of pulmonary edema. Cardiac silhouette is largely obscured, but heart size appears normal. Mediastinal contours are distorted by  patient's positioning. Atherosclerosis in the thoracic aorta. Right internal jugular single-lumen porta cath with tip terminating at the superior cavoatrial junction. IMPRESSION: 1. Slight decrease in pneumothorax component of left-sided hydropneumothorax compared to yesterday's examination. The appearance the chest is otherwise essentially unchanged, as discussed above. Electronically Signed   By: Vinnie Langton M.D.   On: 02/06/2015 18:48   Ct Angio Chest Pe W/cm &/or Wo Cm  02/06/2015  CLINICAL DATA:  Cough and shortness of breath for 2 days. Symptoms after having bronchoscopy. Patient with history of left breast cancer and endometrial cancer with lung metastasis. EXAM: CT ANGIOGRAPHY CHEST WITH CONTRAST TECHNIQUE: Multidetector CT imaging of the chest was performed using the standard protocol during bolus administration of intravenous contrast. Multiplanar CT image reconstructions and MIPs were obtained to evaluate the vascular anatomy. CONTRAST:  134m OMNIPAQUE IOHEXOL 350 MG/ML SOLN COMPARISON:  Most recent chest CT 01/24/2015. Most recent chest radiograph earlier this day at 1824 hour FINDINGS: There are no filling defects within the pulmonary arteries to suggest pulmonary embolus, with limitations secondary to breathing motion artifact (particularly in the right lung base) and left lung atelectasis. A questionable filling defect seen only on a single image series 10 image 163 is felt to be artifactual. Small left anterior apical pneumothorax. Left pleural effusion with increase from CT 13 days prior, now moderate in size. There is no definite pleural nodularity. Pleural effusion causes marked atelectasis in the left lower and to a lesser extent dependent left upper lobe. No right pleural effusion. The left  paramediastinal masses have increased in size in the short interim, current measurement 6.0 x 9.0 cm (previously 7.0 x 4.7 cm), of the superior lesion; more inferior lesion also increased in size 6.4  x 5.0 cm (previously 5.2 x 3.2 cm). Masslike density between these 2 lesions has also increased. Right lower lobe pulmonary nodule measures 12 x 11 mm, previously 7 x 7 mm, with adjacent cystic space unchanged. There is mild rightward mediastinal shift, new from prior CT. No definite mediastinal adenopathy. No pericardial effusion. Esophagus is patulous and fluid-filled. Unchanged appearance of the upper abdomen. 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 No focal osseous lesions are seen. Review of the MIP images confirms the above findings. IMPRESSION: 1. No pulmonary embolus allowing for limitations secondary to breathing artifact and left pleural effusion. 2. Left hydro pneumothorax, with small pneumothorax component anterior apical and moderate hydrothorax. Pleural fluid measures simple fluid density. 3. Increased size of the left paramediastinal masses in the short interim (13 days). Right lower lobe 11 mm pulmonary nodule has also increased in the interim. 4. There is mild rightward mediastinal shift from prior CT, unclear whether this is secondary to pleural effusion, left-sided pulmonary masses, or pneumothorax, though the relative size of pneumothorax component compared to pleural effusion argues against this as etiology. These results were called by telephone at the time of interpretation on 02/06/2015 at 9:24 pm to Dr. Francine Graven , who verbally acknowledged these results. Electronically Signed   By: Jeb Levering M.D.   On: 02/06/2015 21:26   Dg Swallowing Func-speech Pathology  02/07/2015  Objective Swallowing Evaluation: Type of Study: MBS-Modified Barium Swallow Study Patient Details Name: ARRAYAH CONNORS MRN: [id number] Date of Birth: 22-Dec-1945 Today's Date: 02/07/2015 Time: SLP Start Time (ACUTE ONLY): 0948-SLP Stop Time (ACUTE ONLY): 1000 SLP Time Calculation (min) (ACUTE ONLY): 12 min Past Medical History: Past Medical History Diagnosis Date . Cancer (HCC)    breast . Arthritis     Rheumatoid arthritis . PONV (postoperative nausea and vomiting) 1982 . Breast cancer ([place]) 09/1980   left unilateral mastectomy, no chemo or radiatioin . Endometrial cancer ([place]) 2014 . Lung mass  . Pneumonia    hx Past Surgical History: Past Surgical History Procedure Laterality Date . Mastectomy Left 09/1980 . Tubal ligation  April 1982 . Dilatation & currettage/hysteroscopy with resectocope N/A 11/18/2012   Procedure: DILATATION & CURETTAGE/HYSTEROSCOPY WITH RESECTOCOPE;  Surgeon: Marvene Staff, MD;  Location: [facility];  Service: Gynecology;  Laterality: N/A; . Breast biopsy Left 09/11/80 . Vaginal hysterectomy  12/09/2012   Complete hysterectomy - [place] placement Right 8/16 . Abdominal hysterectomy   . Lung biopsy  01/18/15 . Video bronchoscopy with endobronchial navigation N/A 02/04/2015   Procedure: VIDEO BRONCHOSCOPY WITH ENDOBRONCHIAL NAVIGATION;  Surgeon: [name], MD;  Location: [facility] OR;  Service: Thoracic;  Laterality: N/A; HPI: AVANA KREISER is an 70 y.o. 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 Patient was discharged from hospital 1/31 s/p lung biopsy where she was noted to have a pneumothorax. Pneumothorax noted to be stable upon discharge. Per patient, coughing worsens when ambulating and trying to eat. MD questioning aspiration and bronchospasm as cause of acute decompensation.  No Data Recorded Assessment / Plan / Recommendation CHL IP CLINICAL IMPRESSIONS 02/07/2015 Therapy Diagnosis WFL Clinical Impression Patient presents with largely normal oropharyngeal swallowing function with intermittent delay in swallow initiation but with good airway protection .  No frank penetration or aspiration observed. Esophageal sweep revealed no significant abnormalities, possible mild stasis in lower esophagus however MD not present to confirm.  Impact on safety and function Mild aspiration risk   CHL IP TREATMENT  RECOMMENDATION 02/07/2015 Treatment Recommendations No treatment recommended at this time   No flowsheet data found. CHL IP DIET RECOMMENDATION 02/07/2015 SLP Diet Recommendations Regular solids;Thin liquid Liquid Administration via Cup;Straw Medication Administration Whole meds with liquid Compensations Slow rate;Small sips/bites Postural Changes Remain semi-upright after after feeds/meals (Comment);Seated upright at 90 degrees   CHL IP OTHER RECOMMENDATIONS 02/07/2015 Recommended Consults -- Oral Care Recommendations Oral care BID Other Recommendations --   CHL IP FOLLOW UP RECOMMENDATIONS 02/07/2015 Follow up Recommendations None   No flowsheet data found.     CHL IP ORAL PHASE 02/07/2015 Oral Phase WFL Oral - Pudding Teaspoon -- Oral - Pudding Cup -- Oral - Honey Teaspoon -- Oral - Honey Cup -- Oral - Nectar Teaspoon -- Oral - Nectar Cup -- Oral - Nectar Straw -- Oral - Thin Teaspoon -- Oral - Thin Cup -- Oral - Thin Straw -- Oral - Puree -- Oral - Mech Soft -- Oral - Regular -- Oral - Multi-Consistency -- Oral - Pill -- Oral Phase - Comment --  CHL IP PHARYNGEAL PHASE 02/07/2015 Pharyngeal Phase WFL Pharyngeal- Pudding Teaspoon -- Pharyngeal -- Pharyngeal- Pudding Cup -- Pharyngeal -- Pharyngeal- Honey Teaspoon -- Pharyngeal -- Pharyngeal- Honey Cup -- Pharyngeal -- Pharyngeal- Nectar Teaspoon -- Pharyngeal -- Pharyngeal- Nectar Cup -- Pharyngeal -- Pharyngeal- Nectar Straw -- Pharyngeal -- Pharyngeal- Thin Teaspoon -- Pharyngeal -- Pharyngeal- Thin Cup -- Pharyngeal -- Pharyngeal- Thin Straw -- Pharyngeal -- Pharyngeal- Puree -- Pharyngeal -- Pharyngeal- Mechanical Soft -- Pharyngeal -- Pharyngeal- Regular -- Pharyngeal -- Pharyngeal- Multi-consistency -- Pharyngeal -- Pharyngeal- Pill -- Pharyngeal -- Pharyngeal Comment --  CHL IP CERVICAL ESOPHAGEAL PHASE 02/07/2015 Cervical Esophageal Phase WFL Pudding Teaspoon -- Pudding Cup -- Honey Teaspoon -- Honey Cup -- Nectar Teaspoon -- Nectar Cup -- Nectar Straw -- Thin Teaspoon  -- Thin Cup -- Thin Straw -- Puree -- Mechanical Soft -- Regular -- Multi-consistency -- Pill -- Cervical Esophageal Comment -- No flowsheet data found. Leah McCoy MA, CCC-SLP [phone] McCoy Leah Meryl 02/07/2015, 12:28 PM               ROS Blood pressure 101/52, pulse 95, temperature 98 F (36.7 C), temperature source Oral, resp. rate 20, height 4\' 11"  (1.499 m), weight 115 lb 11.9 oz (52.5 kg), SpO2 97 %. Physical Exam  Vitals reviewed. Constitutional: She is oriented to person, place, and time. She appears well-developed. No distress.  HENT:  Head: Normocephalic and atraumatic.  Mouth/Throat: No oropharyngeal exudate.  Eyes: Conjunctivae and EOM are normal. No scleral icterus.  Neck: No tracheal deviation present. No thyromegaly present.  Cardiovascular: Normal rate, regular rhythm and normal heart sounds.   Respiratory: Effort normal. She has no wheezes. She has no rales.  Diminished BS left base  GI: Soft. There is no tenderness.  Musculoskeletal: She exhibits no edema.  Lymphadenopathy:    She has no cervical adenopathy.  Neurological: She is alert and oriented to person, place, and time. No cranial nerve deficit. She exhibits normal muscle tone.  Skin: Skin is warm and dry.    Assessment/Plan: 70 yo woman with metastatic endometrial cancer with 2 left lung masses that are growing rapidly despite an otherwise good response to therapy. Biopsies showed atypical cells suspicious for malignancy but were not definitively  diagnostic. She has a symptomatic hydropneumothorax as a complication of her bronch.   There are 2 issues to address. 1) the hydropneumothorax and 2) need for a definitive diagnosis. I think the best approach is to deal with both issues at the same time. I recommended to her that we do a left VATS to drain the effusion and to biopsy the lung masses at the same time.   I discussed the general nature of the procedure, the need for general anesthesia, the incisions to  be used, and chest tube placement with Mrs. Peyser. We discussed the expected hospital stay, overall recovery and short and long term outcomes. I reviewed the indications, risks, benefits and alternatives. She understands the risks include, but are not limited to death, stroke, MI, DVT/PE, bleeding, possible need for transfusion, infections, air leaks and other organ system dysfunction including respiratory, renal, or GI complications. She accepts the risks and agrees to proceed.  For OR in AM 2/3  [name] 02/07/2015, 4:58 PM

## 2015-02-08 ENCOUNTER — Encounter (HOSPITAL_COMMUNITY)
Admission: EM | Disposition: A | Discharge status: Hospice | Payer: Self-pay | Source: Home / Self Care | Attending: Internal Medicine

## 2015-02-08 ENCOUNTER — Inpatient Hospital Stay (HOSPITAL_COMMUNITY): Payer: Medicare Other | Admitting: Certified Registered Nurse Anesthetist

## 2015-02-08 ENCOUNTER — Inpatient Hospital Stay (HOSPITAL_COMMUNITY): Payer: Medicare Other

## 2015-02-08 DIAGNOSIS — R918 Other nonspecific abnormal finding of lung field: Secondary | ICD-10-CM

## 2015-02-08 DIAGNOSIS — J90 Pleural effusion, not elsewhere classified: Secondary | ICD-10-CM

## 2015-02-08 DIAGNOSIS — J9601 Acute respiratory failure with hypoxia: Secondary | ICD-10-CM

## 2015-02-08 DIAGNOSIS — R222 Localized swelling, mass and lump, trunk: Secondary | ICD-10-CM

## 2015-02-08 DIAGNOSIS — D6489 Other specified anemias: Secondary | ICD-10-CM

## 2015-02-08 HISTORY — PX: PLEURAL EFFUSION DRAINAGE: SHX5099

## 2015-02-08 HISTORY — PX: VIDEO ASSISTED THORACOSCOPY: SHX5073

## 2015-02-08 LAB — CBC
HEMATOCRIT: 27.9 % — AB (ref 36.0–46.0)
HEMOGLOBIN: 9.3 g/dL — AB (ref 12.0–15.0)
MCH: 35.5 pg — AB (ref 26.0–34.0)
MCHC: 33.3 g/dL (ref 30.0–36.0)
MCV: 106.5 fL — AB (ref 78.0–100.0)
Platelets: 255 10*3/uL (ref 150–400)
RBC: 2.62 MIL/uL — ABNORMAL LOW (ref 3.87–5.11)
RDW: 15.6 % — AB (ref 11.5–15.5)
WBC: 11.4 10*3/uL — ABNORMAL HIGH (ref 4.0–10.5)

## 2015-02-08 LAB — GRAM STAIN

## 2015-02-08 LAB — BASIC METABOLIC PANEL
ANION GAP: 14 (ref 5–15)
BUN: 8 mg/dL (ref 6–20)
CHLORIDE: 106 mmol/L (ref 101–111)
CO2: 23 mmol/L (ref 22–32)
Calcium: 8.8 mg/dL — ABNORMAL LOW (ref 8.9–10.3)
Creatinine, Ser: 0.57 mg/dL (ref 0.44–1.00)
GFR calc Af Amer: >60 mL/min (ref >60)
GLUCOSE: 156 mg/dL — AB (ref 65–99)
POTASSIUM: 3.7 mmol/L (ref 3.5–5.1)
Sodium: 143 mmol/L (ref 135–145)

## 2015-02-08 LAB — SURGICAL PCR SCREEN
MRSA, PCR: NEGATIVE
STAPHYLOCOCCUS AUREUS: NEGATIVE

## 2015-02-08 SURGERY — VIDEO ASSISTED THORACOSCOPY
Anesthesia: General | Site: Chest | Laterality: Left

## 2015-02-08 MED ORDER — DIPHENHYDRAMINE HCL 12.5 MG/5ML PO ELIX: 12.5000 mg | ORAL_SOLUTION | Freq: Four times a day (QID) | ORAL | Status: DC | PRN

## 2015-02-08 MED ORDER — HEMOSTATIC AGENTS (NO CHARGE) OPTIME
TOPICAL | Status: DC | PRN
  Administered 2015-02-08: 1 via TOPICAL

## 2015-02-08 MED ORDER — SCOPOLAMINE 1 MG/3DAYS TD PT72
MEDICATED_PATCH | TRANSDERMAL | Status: AC
  Administered 2015-02-08: 1.5 mg via TRANSDERMAL
  Filled 2015-02-08: qty 1

## 2015-02-08 MED ORDER — CETYLPYRIDINIUM CHLORIDE 0.05 % MT LIQD
7.0000 mL | Freq: Two times a day (BID) | OROMUCOSAL | Status: DC
  Administered 2015-02-08 – 2015-02-14 (×10): 7 mL via OROMUCOSAL

## 2015-02-08 MED ORDER — PROMETHAZINE HCL 25 MG/ML IJ SOLN: 6.2500 mg | INTRAMUSCULAR | Status: DC | PRN

## 2015-02-08 MED ORDER — FENTANYL CITRATE (PF) 100 MCG/2ML IJ SOLN
INTRAMUSCULAR | Status: AC
  Filled 2015-02-08: qty 2

## 2015-02-08 MED ORDER — LACTATED RINGERS IV SOLN: INTRAVENOUS | Status: DC

## 2015-02-08 MED ORDER — GLYCOPYRROLATE 0.2 MG/ML IJ SOLN
INTRAMUSCULAR | Status: AC
  Filled 2015-02-08: qty 1

## 2015-02-08 MED ORDER — FENTANYL CITRATE (PF) 250 MCG/5ML IJ SOLN
INTRAMUSCULAR | Status: AC
  Filled 2015-02-08: qty 5

## 2015-02-08 MED ORDER — ONDANSETRON HCL 4 MG/2ML IJ SOLN
INTRAMUSCULAR | Status: DC | PRN
  Administered 2015-02-08: 4 mg via INTRAVENOUS

## 2015-02-08 MED ORDER — PROPOFOL 10 MG/ML IV BOLUS
INTRAVENOUS | Status: DC | PRN
  Administered 2015-02-08: 130 mg via INTRAVENOUS

## 2015-02-08 MED ORDER — MIDAZOLAM HCL 5 MG/5ML IJ SOLN
INTRAMUSCULAR | Status: DC | PRN
  Administered 2015-02-08 (×2): 1 mg via INTRAVENOUS

## 2015-02-08 MED ORDER — SUCCINYLCHOLINE CHLORIDE 20 MG/ML IJ SOLN
INTRAMUSCULAR | Status: AC
  Filled 2015-02-08: qty 1

## 2015-02-08 MED ORDER — LACTATED RINGERS IV SOLN
INTRAVENOUS | Status: DC | PRN
  Administered 2015-02-08 (×2): via INTRAVENOUS

## 2015-02-08 MED ORDER — ACETAMINOPHEN 160 MG/5ML PO SOLN: 1000.0000 mg | Freq: Four times a day (QID) | ORAL | Status: AC

## 2015-02-08 MED ORDER — PHENYLEPHRINE 40 MCG/ML (10ML) SYRINGE FOR IV PUSH (FOR BLOOD PRESSURE SUPPORT)
PREFILLED_SYRINGE | INTRAVENOUS | Status: AC
  Filled 2015-02-08: qty 10

## 2015-02-08 MED ORDER — DIPHENHYDRAMINE HCL 50 MG/ML IJ SOLN: 12.5000 mg | Freq: Four times a day (QID) | INTRAMUSCULAR | Status: DC | PRN

## 2015-02-08 MED ORDER — DEXAMETHASONE SODIUM PHOSPHATE 10 MG/ML IJ SOLN
INTRAMUSCULAR | Status: DC | PRN
  Administered 2015-02-08: 10 mg via INTRAVENOUS

## 2015-02-08 MED ORDER — ONDANSETRON HCL 4 MG/2ML IJ SOLN: 4.0000 mg | Freq: Four times a day (QID) | INTRAMUSCULAR | Status: DC | PRN

## 2015-02-08 MED ORDER — SODIUM CHLORIDE 0.9% FLUSH: 9.0000 mL | INTRAVENOUS | Status: DC | PRN

## 2015-02-08 MED ORDER — POTASSIUM CHLORIDE IN NACL 20-0.9 MEQ/L-% IV SOLN
INTRAVENOUS | Status: DC
  Administered 2015-02-08 – 2015-02-09 (×2): via INTRAVENOUS
  Filled 2015-02-08 (×3): qty 1000

## 2015-02-08 MED ORDER — FENTANYL CITRATE (PF) 100 MCG/2ML IJ SOLN: 25.0000 ug | INTRAMUSCULAR | Status: DC | PRN

## 2015-02-08 MED ORDER — LIDOCAINE HCL (CARDIAC) 20 MG/ML IV SOLN
INTRAVENOUS | Status: DC | PRN
  Administered 2015-02-08: 60 mg via INTRAVENOUS

## 2015-02-08 MED ORDER — FENTANYL 40 MCG/ML IV SOLN
INTRAVENOUS | Status: DC
  Administered 2015-02-08: 80 ug via INTRAVENOUS
  Administered 2015-02-08: 130 ug via INTRAVENOUS
  Administered 2015-02-08: 12:00:00 via INTRAVENOUS
  Administered 2015-02-09: 10 ug via INTRAVENOUS
  Administered 2015-02-09: 60 ug via INTRAVENOUS
  Administered 2015-02-09: 30 ug via INTRAVENOUS
  Administered 2015-02-09: 20 ug via INTRAVENOUS
  Administered 2015-02-09: 40 ug via INTRAVENOUS
  Administered 2015-02-10 (×2): 10 ug via INTRAVENOUS
  Administered 2015-02-10 (×2): 20 ug via INTRAVENOUS
  Administered 2015-02-10: 50 ug via INTRAVENOUS
  Administered 2015-02-11: 30 ug via INTRAVENOUS
  Administered 2015-02-11 (×2): 20 ug via INTRAVENOUS

## 2015-02-08 MED ORDER — LUNG SURGERY BOOK
Freq: Once | Status: DC
  Filled 2015-02-08: qty 1

## 2015-02-08 MED ORDER — ROCURONIUM BROMIDE 50 MG/5ML IV SOLN
INTRAVENOUS | Status: AC
  Filled 2015-02-08: qty 1

## 2015-02-08 MED ORDER — MIDAZOLAM HCL 2 MG/2ML IJ SOLN
INTRAMUSCULAR | Status: AC
Start: 2015-02-08 — End: 2015-02-08
  Administered 2015-02-08: 0.5 mg
  Filled 2015-02-08: qty 2

## 2015-02-08 MED ORDER — VANCOMYCIN HCL IN DEXTROSE 1-5 GM/200ML-% IV SOLN
1000.0000 mg | Freq: Two times a day (BID) | INTRAVENOUS | Status: AC
  Administered 2015-02-08: 1000 mg via INTRAVENOUS
  Filled 2015-02-08: qty 200

## 2015-02-08 MED ORDER — MEPERIDINE HCL 25 MG/ML IJ SOLN: 6.2500 mg | INTRAMUSCULAR | Status: DC | PRN

## 2015-02-08 MED ORDER — BISACODYL 5 MG PO TBEC
10.0000 mg | DELAYED_RELEASE_TABLET | Freq: Every day | ORAL | Status: DC
  Administered 2015-02-09 – 2015-02-10 (×2): 10 mg via ORAL
  Filled 2015-02-08 (×2): qty 2

## 2015-02-08 MED ORDER — METOCLOPRAMIDE HCL 5 MG/ML IJ SOLN
10.0000 mg | Freq: Four times a day (QID) | INTRAMUSCULAR | Status: AC
Start: 2015-02-08 — End: 2015-02-09
  Administered 2015-02-08 – 2015-02-09 (×4): 10 mg via INTRAVENOUS
  Filled 2015-02-08 (×4): qty 2

## 2015-02-08 MED ORDER — PHENYLEPHRINE HCL 10 MG/ML IJ SOLN
10.0000 mg | INTRAVENOUS | Status: DC | PRN
  Administered 2015-02-08: 30 ug/min via INTRAVENOUS

## 2015-02-08 MED ORDER — EPHEDRINE SULFATE 50 MG/ML IJ SOLN
INTRAMUSCULAR | Status: AC
Start: 2015-02-08 — End: 2015-02-08
  Filled 2015-02-08: qty 1

## 2015-02-08 MED ORDER — SODIUM CHLORIDE 0.9 % IJ SOLN
INTRAMUSCULAR | Status: AC
  Filled 2015-02-08: qty 10

## 2015-02-08 MED ORDER — ONDANSETRON HCL 4 MG/2ML IJ SOLN
INTRAMUSCULAR | Status: AC
  Filled 2015-02-08: qty 2

## 2015-02-08 MED ORDER — FENTANYL 40 MCG/ML IV SOLN
INTRAVENOUS | Status: AC
  Filled 2015-02-08: qty 25

## 2015-02-08 MED ORDER — ACETAMINOPHEN 500 MG PO TABS
1000.0000 mg | ORAL_TABLET | Freq: Four times a day (QID) | ORAL | Status: AC
  Administered 2015-02-09 – 2015-02-12 (×14): 1000 mg via ORAL
  Filled 2015-02-08 (×15): qty 2

## 2015-02-08 MED ORDER — SENNOSIDES-DOCUSATE SODIUM 8.6-50 MG PO TABS
1.0000 | ORAL_TABLET | Freq: Every day | ORAL | Status: DC
  Administered 2015-02-08 – 2015-02-10 (×3): 1 via ORAL
  Filled 2015-02-08 (×3): qty 1

## 2015-02-08 MED ORDER — ROCURONIUM BROMIDE 100 MG/10ML IV SOLN
INTRAVENOUS | Status: DC | PRN
  Administered 2015-02-08: 50 mg via INTRAVENOUS

## 2015-02-08 MED ORDER — SUGAMMADEX SODIUM 200 MG/2ML IV SOLN
INTRAVENOUS | Status: DC | PRN
  Administered 2015-02-08: 150 mg via INTRAVENOUS

## 2015-02-08 MED ORDER — TRAMADOL HCL 50 MG PO TABS
50.0000 mg | ORAL_TABLET | Freq: Four times a day (QID) | ORAL | Status: DC | PRN
  Administered 2015-02-10 – 2015-02-13 (×3): 100 mg via ORAL
  Filled 2015-02-08 (×4): qty 2

## 2015-02-08 MED ORDER — FENTANYL CITRATE (PF) 100 MCG/2ML IJ SOLN
INTRAMUSCULAR | Status: DC | PRN
  Administered 2015-02-08 (×2): 75 ug via INTRAVENOUS
  Administered 2015-02-08 (×2): 50 ug via INTRAVENOUS

## 2015-02-08 MED ORDER — SUGAMMADEX SODIUM 200 MG/2ML IV SOLN
INTRAVENOUS | Status: AC
  Filled 2015-02-08: qty 2

## 2015-02-08 MED ORDER — NALOXONE HCL 0.4 MG/ML IJ SOLN: 0.4000 mg | INTRAMUSCULAR | Status: DC | PRN

## 2015-02-08 MED ORDER — 0.9 % SODIUM CHLORIDE (POUR BTL) OPTIME
TOPICAL | Status: DC | PRN
  Administered 2015-02-08: 2000 mL

## 2015-02-08 MED ORDER — DEXAMETHASONE SODIUM PHOSPHATE 10 MG/ML IJ SOLN
INTRAMUSCULAR | Status: AC
  Filled 2015-02-08: qty 1

## 2015-02-08 MED ORDER — PROPOFOL 10 MG/ML IV BOLUS
INTRAVENOUS | Status: AC
  Filled 2015-02-08: qty 20

## 2015-02-08 MED ORDER — LIDOCAINE HCL (CARDIAC) 20 MG/ML IV SOLN
INTRAVENOUS | Status: AC
  Filled 2015-02-08: qty 5

## 2015-02-08 MED ORDER — POTASSIUM CHLORIDE 10 MEQ/50ML IV SOLN: 10.0000 meq | Freq: Every day | INTRAVENOUS | Status: DC | PRN

## 2015-02-08 MED ORDER — MIDAZOLAM HCL 2 MG/2ML IJ SOLN
INTRAMUSCULAR | Status: AC
  Filled 2015-02-08: qty 2

## 2015-02-08 MED ORDER — SCOPOLAMINE 1 MG/3DAYS TD PT72
1.0000 | MEDICATED_PATCH | TRANSDERMAL | Status: DC
  Administered 2015-02-08 – 2015-02-14 (×3): 1.5 mg via TRANSDERMAL
  Filled 2015-02-08 (×2): qty 1

## 2015-02-08 SURGICAL SUPPLY — 87 items
ADH SKN CLS APL DERMABOND .7 (GAUZE/BANDAGES/DRESSINGS) ×2
APPLIER CLIP ROT 10 11.4 M/L (STAPLE)
APR CLP MED LRG 11.4X10 (STAPLE)
BAG SPEC RTRVL LRG 6X4 10 (ENDOMECHANICALS) ×4
CANISTER SUCTION 2500CC (MISCELLANEOUS) ×4 IMPLANT
CATH KIT ON Q 5IN SLV (PAIN MANAGEMENT) IMPLANT
CATH THORACIC 28FR (CATHETERS) IMPLANT
CATH THORACIC 28FR RT ANG (CATHETERS) IMPLANT
CATH THORACIC 36FR (CATHETERS) IMPLANT
CATH THORACIC 36FR RT ANG (CATHETERS) IMPLANT
CLIP APPLIE ROT 10 11.4 M/L (STAPLE) IMPLANT
CLIP TI MEDIUM 6 (CLIP) IMPLANT
CONN ST 1/4X3/8  BEN (MISCELLANEOUS) ×2
CONN ST 1/4X3/8 BEN (MISCELLANEOUS) IMPLANT
CONN Y 3/8X3/8X3/8  BEN (MISCELLANEOUS) ×4
CONN Y 3/8X3/8X3/8 BEN (MISCELLANEOUS) ×2 IMPLANT
CONT SPEC 4OZ CLIKSEAL STRL BL (MISCELLANEOUS) ×8 IMPLANT
COVER SURGICAL LIGHT HANDLE (MISCELLANEOUS) ×2 IMPLANT
CUTTER ECHEON FLEX ENDO 45 340 (ENDOMECHANICALS) ×2 IMPLANT
DERMABOND ADVANCED (GAUZE/BANDAGES/DRESSINGS) ×2
DERMABOND ADVANCED .7 DNX12 (GAUZE/BANDAGES/DRESSINGS) IMPLANT
DRAIN CHANNEL 28F RND 3/8 FF (WOUND CARE) IMPLANT
DRAIN CHANNEL 32F RND 10.7 FF (WOUND CARE) IMPLANT
DRAPE LAPAROSCOPIC ABDOMINAL (DRAPES) ×4 IMPLANT
DRAPE PROXIMA HALF (DRAPES) ×2 IMPLANT
DRAPE SLUSH MACHINE 52X66 (DRAPES) ×2 IMPLANT
DRAPE WARM FLUID 44X44 (DRAPE) ×2 IMPLANT
ELECT REM PT RETURN 9FT ADLT (ELECTROSURGICAL) ×4
ELECTRODE REM PT RTRN 9FT ADLT (ELECTROSURGICAL) ×2 IMPLANT
GAUZE SPONGE 4X4 12PLY STRL (GAUZE/BANDAGES/DRESSINGS) ×4 IMPLANT
GLOVE BIO SURGEON STRL SZ 6.5 (GLOVE) ×3 IMPLANT
GLOVE BIO SURGEONS STRL SZ 6.5 (GLOVE) ×3
GLOVE BIOGEL PI IND STRL 6.5 (GLOVE) IMPLANT
GLOVE BIOGEL PI INDICATOR 6.5 (GLOVE) ×6
GLOVE SURG SIGNA 7.5 PF LTX (GLOVE) ×10 IMPLANT
GOWN STRL REUS W/ TWL LRG LVL3 (GOWN DISPOSABLE) ×4 IMPLANT
GOWN STRL REUS W/ TWL XL LVL3 (GOWN DISPOSABLE) ×2 IMPLANT
GOWN STRL REUS W/TWL LRG LVL3 (GOWN DISPOSABLE) ×4
GOWN STRL REUS W/TWL XL LVL3 (GOWN DISPOSABLE) ×4
HEMOSTAT SNOW SURGICEL 2X4 (HEMOSTASIS) ×2 IMPLANT
HEMOSTAT SURGICEL 2X14 (HEMOSTASIS) IMPLANT
KIT BASIN OR (CUSTOM PROCEDURE TRAY) ×4 IMPLANT
KIT ROOM TURNOVER OR (KITS) ×4 IMPLANT
KIT SUCTION CATH 14FR (SUCTIONS) ×4 IMPLANT
NS IRRIG 1000ML POUR BTL (IV SOLUTION) ×8 IMPLANT
PACK CHEST (CUSTOM PROCEDURE TRAY) ×4 IMPLANT
PAD ARMBOARD 7.5X6 YLW CONV (MISCELLANEOUS) ×10 IMPLANT
POUCH ENDO CATCH II 15MM (MISCELLANEOUS) IMPLANT
POUCH SPECIMEN RETRIEVAL 10MM (ENDOMECHANICALS) ×4 IMPLANT
RELOAD GREEN ECHELON 45 (STAPLE) ×8 IMPLANT
SEALANT PROGEL (MISCELLANEOUS) IMPLANT
SEALANT SURG COSEAL 4ML (VASCULAR PRODUCTS) IMPLANT
SEALANT SURG COSEAL 8ML (VASCULAR PRODUCTS) IMPLANT
SOLUTION ANTI FOG 6CC (MISCELLANEOUS) ×4 IMPLANT
SPECIMEN JAR MEDIUM (MISCELLANEOUS) ×2 IMPLANT
SPONGE GAUZE 4X4 12PLY STER LF (GAUZE/BANDAGES/DRESSINGS) ×2 IMPLANT
SPONGE INTESTINAL PEANUT (DISPOSABLE) IMPLANT
SPONGE SURGIFOAM ABS GEL 12-7 (HEMOSTASIS) ×2 IMPLANT
SPONGE TONSIL 1 RF SGL (DISPOSABLE) ×2 IMPLANT
SUT PROLENE 4 0 RB 1 (SUTURE)
SUT PROLENE 4-0 RB1 .5 CRCL 36 (SUTURE) IMPLANT
SUT SILK  1 MH (SUTURE) ×4
SUT SILK 1 MH (SUTURE) ×4 IMPLANT
SUT SILK 2 0SH CR/8 30 (SUTURE) IMPLANT
SUT SILK 3 0SH CR/8 30 (SUTURE) IMPLANT
SUT VIC AB 1 CTX 36 (SUTURE) ×4
SUT VIC AB 1 CTX36XBRD ANBCTR (SUTURE) IMPLANT
SUT VIC AB 2-0 CTX 36 (SUTURE) ×2 IMPLANT
SUT VIC AB 2-0 UR6 27 (SUTURE) IMPLANT
SUT VIC AB 3-0 MH 27 (SUTURE) IMPLANT
SUT VIC AB 3-0 X1 27 (SUTURE) ×4 IMPLANT
SUT VICRYL 2 TP 1 (SUTURE) IMPLANT
SWAB COLLECTION DEVICE MRSA (MISCELLANEOUS) IMPLANT
SYSTEM SAHARA CHEST DRAIN ATS (WOUND CARE) ×4 IMPLANT
TAPE CLOTH 4X10 WHT NS (GAUZE/BANDAGES/DRESSINGS) ×4 IMPLANT
TAPE CLOTH SURG 4X10 WHT LF (GAUZE/BANDAGES/DRESSINGS) ×2 IMPLANT
TIP APPLICATOR SPRAY EXTEND 16 (VASCULAR PRODUCTS) IMPLANT
TOWEL OR 17X24 6PK STRL BLUE (TOWEL DISPOSABLE) ×2 IMPLANT
TOWEL OR 17X26 10 PK STRL BLUE (TOWEL DISPOSABLE) ×8 IMPLANT
TOWEL OR NON WOVEN STRL DISP B (DISPOSABLE) ×2 IMPLANT
TRAP SPECIMEN MUCOUS 40CC (MISCELLANEOUS) ×8 IMPLANT
TRAY FOLEY CATH 16FRSI W/METER (SET/KITS/TRAYS/PACK) ×4 IMPLANT
TROCAR XCEL BLADELESS 5X75MML (TROCAR) ×4 IMPLANT
TROCAR XCEL NON-BLD 5MMX100MML (ENDOMECHANICALS) IMPLANT
TUBE ANAEROBIC SPECIMEN COL (MISCELLANEOUS) IMPLANT
TUNNELER SHEATH ON-Q 11GX8 DSP (PAIN MANAGEMENT) IMPLANT
WATER STERILE IRR 1000ML POUR (IV SOLUTION) ×8 IMPLANT

## 2015-02-08 NOTE — Transfer of Care (Signed)
Immediate Anesthesia Transfer of Care Note  Patient: Alejandra Callahan  Procedure(s) Performed: Procedure(s): VIDEO ASSISTED THORACOSCOPY, lung biopsy (Left) DRAINAGE OF PLEURAL EFFUSION (Left)  Patient Location: PACU  Anesthesia Type:General  Level of Consciousness: awake, alert , oriented and patient cooperative  Airway & Oxygen Therapy: Patient Spontanous Breathing and Patient connected to nasal cannula oxygen  Post-op Assessment: Report given to RN, Post -op Vital signs reviewed and stable and Patient moving all extremities  Post vital signs: Reviewed and stable  Last Vitals:  Filed Vitals:   02/07/15 2141 02/08/15 0622  BP: 127/65 104/56  Pulse: 101 101  Temp: 36.6 C 36.9 C  Resp: 18 18    Complications: No apparent anesthesia complications

## 2015-02-08 NOTE — Op Note (Signed)
NAME:  Alejandra Callahan, Alejandra Callahan NO.:  [id number]  MEDICAL RECORD NO.:  [id number]  LOCATION:  [id number]                        FACILITY:  [facility]  PHYSICIAN:  Revonda Standard. Roxan Hockey, M.D.DATE OF BIRTH:  1945/05/29  DATE OF PROCEDURE:  02/08/2015 DATE OF DISCHARGE:                              OPERATIVE REPORT   PREOPERATIVE DIAGNOSIS:  Left hydropneumothorax and left lung masses.  POSTOPERATIVE DIAGNOSIS:  Left hydropneumothorax and left lung masses.  PROCEDURE:  Left video-assisted thoracoscopy, drainage of pleural effusion, lung biopsy from lingula.  SURGEON:  Revonda Standard. Roxan Hockey, M.D.  ASSISTANT:  Ellwood Handler, PA.  ANESTHESIA:  General.  FINDINGS:  Bloody pleural effusion.  Two large masses in the lingula.  Very friable and vascular. Densely adherent to and likely invading the pericardium and mediastinum.  Frozen section showed malignancy.  CLINICAL NOTE:  Alejandra Callahan is a 70 year old woman with a known history of metastatic endometrial cancer.  She recently has completed a course of treatment for multiple pulmonary metastases.  She had a good response in the majority of the nodules, but 2 masses in the lingula have grown dramatically in size over relatively brief period of time.  A CT-guided needle biopsy was nondiagnostic and bronchoscopic biopsy showed atypical cells suspicious for but not diagnostic of malignancy.  After bronchoscopy she had a pneumothorax which was managed conservatively but she presented back with shortness of breath with a hydropneumothorax. Given that there was still no diagnosis, it was recommended she undergo a left VATS to drain the hydropneumothorax and also to obtain more tissue for pathology.  The indications, risks, benefits, and alternatives were discussed in detail with the patient.  She understood and accepted the risks and agreed to proceed.  OPERATIVE NOTE:  Alejandra Callahan was brought to the preoperative holding area on February 08, 2015. An arterial blood pressure monitoring line was placed.  She was taken to the operating room, anesthetized, and intubated.  Intravenous antibiotics were administered.  A Foley catheter was placed.  Sequential compressive devices were placed on the calves for DVT prophylaxis.  She was placed in a right lateral decubitus position and the left chest was prepped and draped in usual sterile fashion.  Single lung ventilation of the right lung was initiated and was tolerated well throughout the procedure.  An incision was made in the seventh intercostal space in the midaxillary line. It was carried through the skin and subcutaneous tissue.  The chest was entered bluntly using a hemostat.  A sucker was placed in the chest and bloody fluid was evacuated.  A portion of the specimen was sent for cultures, and the remainder was sent for cytology.  After removing enough fluid to allow placement of the scope, a 5 mm port was placed through this incision and the thoracoscope was advanced in the chest. There was a large mass in the lingula anteriorly and there was good isolation of the left lung.  There was a moderate pleural effusion.  A small working incision approximately 4 cm in length was made anterolaterally in the fifth interspace. No rib spreading was performed during the procedure.  The remainder of the effusion was evacuated.  The more  distal of the lingular masses was right under the incision. Attempts to mobilize enough to get a biopsy resulted in bleeding as it was very vascular and friable. A biopsy was obtained of the lingula where there was some normal lung directly adjacent to the mass and then a second biopsy was performed that appeared to be more the actual mass itself, both of these were done with sequential firings of endoscopic stapler.  An Echelon 45 mm powered stapler with green cartridge was used.  There was some cracking of the tissue with closure of the stapler. Both  specimens were removed separately using an endoscopic retrieval bag and sent for frozen section.  Fibrillar hemostatic agent was applied over the area of the biopsies to assist with hemostasis. The frozen section returned showing no definite tumor in the first specimen, but the second specimen had definite malignancy. Final characterization will await permanent pathology.  A 28-French Blake drain was placed through the original port incision and directed posteriorly to the apex and a second port incision was made and a 28- French chest tube was placed and directed anteriorly and towards the apex.  Both were secured at the skin with #1 silk sutures.  The small working incision was closed with a #1 Vicryl fascial suture and the subcutaneous tissue and skin were closed in standard fashion.  The left lung was reinflated.  The chest tubes were placed to suction.  The patient was taken from the operating room to the [facility] in good condition.     Revonda Standard Roxan Hockey, M.D.     SCH/MEDQ  D:  02/08/2015  T:  02/08/2015  Job:  [id number]

## 2015-02-08 NOTE — Brief Op Note (Addendum)
02/06/2015 - 02/08/2015  12:78 AM  PATIENT:  Alejandra Callahan  70 y.o. female  PRE-OPERATIVE DIAGNOSIS:  LEFT HYDROPNEUMOTHORAX, LEFT LUNG MASSES   POST-OPERATIVE DIAGNOSIS:  LEFT HYDROPNEUMOTHORAX, LEFT LUNG MASSES  PROCEDURE:  Procedure(s):  LEFT VIDEO ASSISTED THORACOSCOPY DRAINAGE OF PLEURAL EFFUSION LUNG BIOPSY (LINGULA)   SURGEON:  Surgeon(s) and Role:    * [name], MD - Primary  PHYSICIAN ASSISTANT: Erin Barrett PA-C  ANESTHESIA:   general  EBL:  Total I/O In: 1000 [I.V.:1000] Out: 150 [Urine:50; Blood:100]  BLOOD ADMINISTERED:none  DRAINS: 28 Straight, Blake Drain Left Chest   LOCAL MEDICATIONS USED:  NONE  SPECIMEN:  Source of Specimen:  Lung Biopsy Left Lingula, Lung mass  DISPOSITION OF SPECIMEN:  PATHOLOGY  COUNTS:  YES  PLAN OF CARE: Admit to inpatient   PATIENT DISPOSITION:  PACU - hemodynamically stable.   Delay start of Pharmacological VTE agent (>24hrs) due to surgical blood loss or risk of bleeding: yes  FINDINGS: Bloody effusion, 2 large masses in the lingula. Friable and vascular. Densely adherent to and likely invading pericardium/ mediastinum. FROZEN showed malignancy

## 2015-02-08 NOTE — Anesthesia Procedure Notes (Signed)
Procedure Name: Intubation Date/Time: 02/08/2015 9:56 AM Performed by: Layla Maw Pre-anesthesia Checklist: Patient identified, Patient being monitored, Timeout performed, Emergency Drugs available and Suction available Patient Re-evaluated:Patient Re-evaluated prior to inductionOxygen Delivery Method: Circle System Utilized Preoxygenation: Pre-oxygenation with 100% oxygen Intubation Type: IV induction Ventilation: Mask ventilation without difficulty Laryngoscope Size: 3 and Mac Grade View: Grade II Tube type: Oral Endobronchial tube: Double lumen EBT and Left and 35 Fr Number of attempts: 1 Airway Equipment and Method: Stylet and Fiberoptic brochoscope Placement Confirmation: ETT inserted through vocal cords under direct vision,  positive ETCO2 and breath sounds checked- equal and bilateral Secured at: 28 cm Tube secured with: Tape Dental Injury: Teeth and Oropharynx as per pre-operative assessment

## 2015-02-08 NOTE — H&P (View-Only) (Signed)
Reason for Consult:Left hydropneumothorax Referring Physician: Dr. Hall/ Dr. Stevphen Alejandra Callahan is an 70 y.o. female.  HPI: Alejandra Callahan is a  70 year old woman with a complex medical history including rheumatoid arthritis, breast cancer in 1982, endometrial cancer initially treated in 2014 with a recurrence with lung metastases in the summer of 2016. In June 2016 she noted a significant cough. She was found to have multiple bilateral lung metastases (9 in all). She was treated with carboplatin and Taxol with a good response. However, on a PET CT done in December there was a marked increase in size of a left lingular mass. She was treated with antibiotics and had a follow-up PET in January which showed a marked increase in size of 2 adjacent masses in the lingular segment of the left upper lobe. A CT-guided needle biopsy was done, but showed only necrosis.  I did a navigational bronch with brushings and biopsies on Monday. She developed a left pneumothorax during the procedure. She was not symptomatic. It remained stable the following day and we discharged her to home. Over the next 24 hours she had progressively worsening dyspnea. She went to the ED at [facility] and was transferred to [facility]. She is not SOB at rest.  Pathology from bronch showed atypical cells highly suspicious for, but not diagnostic of, malignancy.   Past Medical History  Diagnosis Date  . Cancer (HCC)     breast  . Arthritis     Rheumatoid arthritis  . PONV (postoperative nausea and vomiting) 1982  . Breast cancer ([place]) 09/1980    left unilateral mastectomy, no chemo or radiatioin  . Endometrial cancer ([place]) 2014  . Lung mass   . Pneumonia     hx    Past Surgical History  Procedure Laterality Date  . Mastectomy Left 09/1980  . Tubal ligation  April 1982  . Dilatation & currettage/hysteroscopy with resectocope N/A 11/18/2012    Procedure: DILATATION & CURETTAGE/HYSTEROSCOPY WITH RESECTOCOPE;  Surgeon: Marvene Staff, MD;  Location: [place] [facility];  Service: Gynecology;  Laterality: N/A;  . Breast biopsy Left 09/11/80  . Vaginal hysterectomy  12/09/2012    Complete hysterectomy - [place] placement Right 8/16  . Abdominal hysterectomy    . Lung biopsy  01/18/15  . Video bronchoscopy with endobronchial navigation N/A 02/04/2015    Procedure: VIDEO BRONCHOSCOPY WITH ENDOBRONCHIAL NAVIGATION;  Surgeon: [name], MD;  Location: [facility] OR;  Service: Thoracic;  Laterality: N/A;    Family History  Problem Relation Age of Onset  . Breast cancer Mother 70  . Heart attack Mother 49  . Colon cancer Father 11  . Heart attack Father 44  . Breast cancer Sister 48  . Heart attack Paternal Grandfather 72  . Breast cancer Other     mother's paternal first cousin dx in late 40s-early 69s    Social History:  reports that she has never smoked. She has never used smokeless tobacco. She reports that she does not drink alcohol or use illicit drugs.  Allergies:  Allergies  Allergen Reactions  . Codeine Other (See Comments)    Blurred vision and dizziness blurred vision, dizzy, loss of hearing  . Acyclovir And Related Swelling, Dermatitis and Rash    fatigue  . Other     TB test- arm swells/fever to extremity  Tegaderm badage- pulls skin off, rash  . Tuberculin Ppd Other (See Comments)    Swelling and redness in arm  .  Tylox [Oxycodone-Acetaminophen] Other (See Comments)    Blurred vision and dizziness Blurred vision, dizziness    Medications:  Scheduled: . albuterol  2.5 mg Nebulization BID  . benzonatate  200 mg Oral TID  . folic acid  1 mg Oral Daily  . heparin  5,000 Units Subcutaneous 3 times per day  . Influenza vac split quadrivalent PF  0.5 mL Intramuscular Once  . [START ON 02/08/2015] methylPREDNISolone (SOLU-MEDROL) injection  40 mg Intravenous Q12H  . multivitamin with minerals  1 tablet Oral Daily  . raloxifene  60 mg Oral Daily  . sodium chloride flush  3 mL  Intravenous Q12H    Results for orders placed or performed during the hospital encounter of 02/06/15 (from the past 48 hour(s))  Basic metabolic panel     Status: Abnormal   Collection Time: 02/06/15  7:18 PM  Result Value Ref Range   Sodium 137 135 - 145 mmol/L   Potassium 3.9 3.5 - 5.1 mmol/L   Chloride 104 101 - 111 mmol/L   CO2 26 22 - 32 mmol/L   Glucose, Bld 99 65 - 99 mg/dL   BUN 18 6 - 20 mg/dL   Creatinine, Ser 0.58 0.44 - 1.00 mg/dL   Calcium 8.6 (L) 8.9 - 10.3 mg/dL   GFR calc non Af Amer >60 >60 mL/min   GFR calc Af Amer >60 >60 mL/min    Comment: (NOTE) The eGFR has been calculated using the CKD EPI equation. This calculation has not been validated in all clinical situations. eGFR's persistently <60 mL/min signify possible Chronic Kidney Disease.    Anion gap 7 5 - 15  Brain natriuretic peptide     Status: None   Collection Time: 02/06/15  7:18 PM  Result Value Ref Range   B Natriuretic Peptide 61.0 0.0 - 100.0 pg/mL  Troponin I     Status: None   Collection Time: 02/06/15  7:18 PM  Result Value Ref Range   Troponin I <0.03 <0.031 ng/mL    Comment:        NO INDICATION OF MYOCARDIAL INJURY.   CBC with Differential     Status: Abnormal   Collection Time: 02/06/15  7:18 PM  Result Value Ref Range   WBC 7.8 4.0 - 10.5 K/uL   RBC 2.61 (L) 3.87 - 5.11 MIL/uL   Hemoglobin 9.4 (L) 12.0 - 15.0 g/dL   HCT 28.1 (L) 36.0 - 46.0 %   MCV 107.7 (H) 78.0 - 100.0 fL   MCH 36.0 (H) 26.0 - 34.0 pg   MCHC 33.5 30.0 - 36.0 g/dL   RDW 15.5 11.5 - 15.5 %   Platelets 220 150 - 400 K/uL   Neutrophils Relative % 60 %   Neutro Abs 4.7 1.7 - 7.7 K/uL   Lymphocytes Relative 20 %   Lymphs Abs 1.5 0.7 - 4.0 K/uL   Monocytes Relative 11 %   Monocytes Absolute 0.9 0.1 - 1.0 K/uL   Eosinophils Relative 9 %   Eosinophils Absolute 0.7 0.0 - 0.7 K/uL   Basophils Relative 0 %   Basophils Absolute 0.0 0.0 - 0.1 K/uL  I-Stat CG4 Lactic Acid, ED     Status: None   Collection Time:  02/06/15  7:27 PM  Result Value Ref Range   Lactic Acid, Venous 0.90 0.5 - 2.0 mmol/L  Lactic acid, plasma     Status: None   Collection Time: 02/06/15 10:15 PM  Result Value Ref Range   Lactic Acid, Venous  0.8 0.5 - 2.0 mmol/L  CBC     Status: Abnormal   Collection Time: 02/07/15  2:56 AM  Result Value Ref Range   WBC 7.2 4.0 - 10.5 K/uL   RBC 2.62 (L) 3.87 - 5.11 MIL/uL   Hemoglobin 9.4 (L) 12.0 - 15.0 g/dL   HCT 27.8 (L) 36.0 - 46.0 %   MCV 106.1 (H) 78.0 - 100.0 fL   MCH 35.9 (H) 26.0 - 34.0 pg   MCHC 33.8 30.0 - 36.0 g/dL   RDW 15.7 (H) 11.5 - 15.5 %   Platelets 244 150 - 400 K/uL  Creatinine, serum     Status: None   Collection Time: 02/07/15  2:56 AM  Result Value Ref Range   Creatinine, Ser 0.54 0.44 - 1.00 mg/dL   GFR calc non Af Amer >60 >60 mL/min   GFR calc Af Amer >60 >60 mL/min    Comment: (NOTE) The eGFR has been calculated using the CKD EPI equation. This calculation has not been validated in all clinical situations. eGFR's persistently <60 mL/min signify possible Chronic Kidney Disease.   TSH     Status: None   Collection Time: 02/07/15  2:56 AM  Result Value Ref Range   TSH 0.769 0.350 - 4.500 uIU/mL    Dg Chest 2 View  02/06/2015  CLINICAL DATA:  70 year old female with increasing shortness of breath. History of lung biopsy 2 days ago. Nonproductive cough for the past 7 months. EXAM: CHEST  2 VIEW COMPARISON:  Chest x-ray 02/04/2014. FINDINGS: Large left pleural effusion. Multiple known pleural-based masses in the left hemithorax are difficult to discretely visualize on today's examination secondary to the large left pleural effusion (see chest CT 01/24/2015 for further details). Right lung is clear. No right pleural effusion. Small right-sided pneumothorax, slightly decreased in size compared with yesterday's examination. No evidence of pulmonary edema. Cardiac silhouette is largely obscured, but heart size appears normal. Mediastinal contours are distorted by  patient's positioning. Atherosclerosis in the thoracic aorta. Right internal jugular single-lumen porta cath with tip terminating at the superior cavoatrial junction. IMPRESSION: 1. Slight decrease in pneumothorax component of left-sided hydropneumothorax compared to yesterday's examination. The appearance the chest is otherwise essentially unchanged, as discussed above. Electronically Signed   By: Vinnie Langton M.D.   On: 02/06/2015 18:48   Ct Angio Chest Pe W/cm &/or Wo Cm  02/06/2015  CLINICAL DATA:  Cough and shortness of breath for 2 days. Symptoms after having bronchoscopy. Patient with history of left breast cancer and endometrial cancer with lung metastasis. EXAM: CT ANGIOGRAPHY CHEST WITH CONTRAST TECHNIQUE: Multidetector CT imaging of the chest was performed using the standard protocol during bolus administration of intravenous contrast. Multiplanar CT image reconstructions and MIPs were obtained to evaluate the vascular anatomy. CONTRAST:  134m OMNIPAQUE IOHEXOL 350 MG/ML SOLN COMPARISON:  Most recent chest CT 01/24/2015. Most recent chest radiograph earlier this day at 1824 hour FINDINGS: There are no filling defects within the pulmonary arteries to suggest pulmonary embolus, with limitations secondary to breathing motion artifact (particularly in the right lung base) and left lung atelectasis. A questionable filling defect seen only on a single image series 10 image 163 is felt to be artifactual. Small left anterior apical pneumothorax. Left pleural effusion with increase from CT 13 days prior, now moderate in size. There is no definite pleural nodularity. Pleural effusion causes marked atelectasis in the left lower and to a lesser extent dependent left upper lobe. No right pleural effusion. The left  paramediastinal masses have increased in size in the short interim, current measurement 6.0 x 9.0 cm (previously 7.0 x 4.7 cm), of the superior lesion; more inferior lesion also increased in size 6.4  x 5.0 cm (previously 5.2 x 3.2 cm). Masslike density between these 2 lesions has also increased. Right lower lobe pulmonary nodule measures 12 x 11 mm, previously 7 x 7 mm, with adjacent cystic space unchanged. There is mild rightward mediastinal shift, new from prior CT. No definite mediastinal adenopathy. No pericardial effusion. Esophagus is patulous and fluid-filled. Unchanged appearance of the upper abdomen. 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 No focal osseous lesions are seen. Review of the MIP images confirms the above findings. IMPRESSION: 1. No pulmonary embolus allowing for limitations secondary to breathing artifact and left pleural effusion. 2. Left hydro pneumothorax, with small pneumothorax component anterior apical and moderate hydrothorax. Pleural fluid measures simple fluid density. 3. Increased size of the left paramediastinal masses in the short interim (13 days). Right lower lobe 11 mm pulmonary nodule has also increased in the interim. 4. There is mild rightward mediastinal shift from prior CT, unclear whether this is secondary to pleural effusion, left-sided pulmonary masses, or pneumothorax, though the relative size of pneumothorax component compared to pleural effusion argues against this as etiology. These results were called by telephone at the time of interpretation on 02/06/2015 at 9:24 pm to Dr. Samuel Jester , who verbally acknowledged these results. Electronically Signed   By: Rubye Oaks M.D.   On: 02/06/2015 21:26   Dg Swallowing Func-speech Pathology  02/07/2015  Objective Swallowing Evaluation: Type of Study: MBS-Modified Barium Swallow Study Patient Details Name: RAVINA MILNER MRN: [id number] Date of Birth: 17-Jun-1945 Today's Date: 02/07/2015 Time: SLP Start Time (ACUTE ONLY): 0948-SLP Stop Time (ACUTE ONLY): 1000 SLP Time Calculation (min) (ACUTE ONLY): 12 min Past Medical History: Past Medical History Diagnosis Date . Cancer (HCC)    breast . Arthritis     Rheumatoid arthritis . PONV (postoperative nausea and vomiting) 1982 . Breast cancer (HCC) 09/1980   left unilateral mastectomy, no chemo or radiatioin . Endometrial cancer (HCC) 2014 . Lung mass  . Pneumonia    hx Past Surgical History: Past Surgical History Procedure Laterality Date . Mastectomy Left 09/1980 . Tubal ligation  April 1982 . Dilatation & currettage/hysteroscopy with resectocope N/A 11/18/2012   Procedure: DILATATION & CURETTAGE/HYSTEROSCOPY WITH RESECTOCOPE;  Surgeon: Serita Kyle, MD;  Location: [facility];  Service: Gynecology;  Laterality: N/A; . Breast biopsy Left 09/11/80 . Vaginal hysterectomy  12/09/2012   Complete hysterectomy [facility] . Portacath placement Right 8/16 . Abdominal hysterectomy   . Lung biopsy  01/18/15 . Video bronchoscopy with endobronchial navigation N/A 02/04/2015   Procedure: VIDEO BRONCHOSCOPY WITH ENDOBRONCHIAL NAVIGATION;  Surgeon: Loreli Slot, MD;  Location: [facility] OR;  Service: Thoracic;  Laterality: N/A; HPI: Alejandra Callahan is an 70 y.o. 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 Patient was discharged from hospital 1/31 s/p lung biopsy where she was noted to have a pneumothorax. Pneumothorax noted to be stable upon discharge. Per patient, coughing worsens when ambulating and trying to eat. MD questioning aspiration and bronchospasm as cause of acute decompensation.  No Data Recorded Assessment / Plan / Recommendation CHL IP CLINICAL IMPRESSIONS 02/07/2015 Therapy Diagnosis WFL Clinical Impression Patient presents with largely normal oropharyngeal swallowing function with intermittent delay in swallow initiation but with good airway protection .  No frank penetration or aspiration observed. Esophageal sweep revealed no significant abnormalities, possible mild stasis in lower esophagus however MD not present to confirm.  Impact on safety and function Mild aspiration risk   CHL IP TREATMENT  RECOMMENDATION 02/07/2015 Treatment Recommendations No treatment recommended at this time   No flowsheet data found. CHL IP DIET RECOMMENDATION 02/07/2015 SLP Diet Recommendations Regular solids;Thin liquid Liquid Administration via Cup;Straw Medication Administration Whole meds with liquid Compensations Slow rate;Small sips/bites Postural Changes Remain semi-upright after after feeds/meals (Comment);Seated upright at 90 degrees   CHL IP OTHER RECOMMENDATIONS 02/07/2015 Recommended Consults -- Oral Care Recommendations Oral care BID Other Recommendations --   CHL IP FOLLOW UP RECOMMENDATIONS 02/07/2015 Follow up Recommendations None   No flowsheet data found.     CHL IP ORAL PHASE 02/07/2015 Oral Phase WFL Oral - Pudding Teaspoon -- Oral - Pudding Cup -- Oral - Honey Teaspoon -- Oral - Honey Cup -- Oral - Nectar Teaspoon -- Oral - Nectar Cup -- Oral - Nectar Straw -- Oral - Thin Teaspoon -- Oral - Thin Cup -- Oral - Thin Straw -- Oral - Puree -- Oral - Mech Soft -- Oral - Regular -- Oral - Multi-Consistency -- Oral - Pill -- Oral Phase - Comment --  CHL IP PHARYNGEAL PHASE 02/07/2015 Pharyngeal Phase WFL Pharyngeal- Pudding Teaspoon -- Pharyngeal -- Pharyngeal- Pudding Cup -- Pharyngeal -- Pharyngeal- Honey Teaspoon -- Pharyngeal -- Pharyngeal- Honey Cup -- Pharyngeal -- Pharyngeal- Nectar Teaspoon -- Pharyngeal -- Pharyngeal- Nectar Cup -- Pharyngeal -- Pharyngeal- Nectar Straw -- Pharyngeal -- Pharyngeal- Thin Teaspoon -- Pharyngeal -- Pharyngeal- Thin Cup -- Pharyngeal -- Pharyngeal- Thin Straw -- Pharyngeal -- Pharyngeal- Puree -- Pharyngeal -- Pharyngeal- Mechanical Soft -- Pharyngeal -- Pharyngeal- Regular -- Pharyngeal -- Pharyngeal- Multi-consistency -- Pharyngeal -- Pharyngeal- Pill -- Pharyngeal -- Pharyngeal Comment --  CHL IP CERVICAL ESOPHAGEAL PHASE 02/07/2015 Cervical Esophageal Phase WFL Pudding Teaspoon -- Pudding Cup -- Honey Teaspoon -- Honey Cup -- Nectar Teaspoon -- Nectar Cup -- Nectar Straw -- Thin Teaspoon  -- Thin Cup -- Thin Straw -- Puree -- Mechanical Soft -- Regular -- Multi-consistency -- Pill -- Cervical Esophageal Comment -- No flowsheet data found. Leah McCoy MA, CCC-SLP [phone] McCoy Leah Meryl 02/07/2015, 12:28 PM               ROS Blood pressure 101/52, pulse 95, temperature 98 F (36.7 C), temperature source Oral, resp. rate 20, height 4\' 11"  (1.499 m), weight 115 lb 11.9 oz (52.5 kg), SpO2 97 %. Physical Exam  Vitals reviewed. Constitutional: She is oriented to person, place, and time. She appears well-developed. No distress.  HENT:  Head: Normocephalic and atraumatic.  Mouth/Throat: No oropharyngeal exudate.  Eyes: Conjunctivae and EOM are normal. No scleral icterus.  Neck: No tracheal deviation present. No thyromegaly present.  Cardiovascular: Normal rate, regular rhythm and normal heart sounds.   Respiratory: Effort normal. She has no wheezes. She has no rales.  Diminished BS left base  GI: Soft. There is no tenderness.  Musculoskeletal: She exhibits no edema.  Lymphadenopathy:    She has no cervical adenopathy.  Neurological: She is alert and oriented to person, place, and time. No cranial nerve deficit. She exhibits normal muscle tone.  Skin: Skin is warm and dry.    Assessment/Plan: 70 yo woman with metastatic endometrial cancer with 2 left lung masses that are growing rapidly despite an otherwise good response to therapy. Biopsies showed atypical cells suspicious for malignancy but were not definitively  diagnostic. She has a symptomatic hydropneumothorax as a complication of her bronch.   There are 2 issues to address. 1) the hydropneumothorax and 2) need for a definitive diagnosis. I think the best approach is to deal with both issues at the same time. I recommended to her that we do a left VATS to drain the effusion and to biopsy the lung masses at the same time.   I discussed the general nature of the procedure, the need for general anesthesia, the incisions to  be used, and chest tube placement with Mrs. Peyser. We discussed the expected hospital stay, overall recovery and short and long term outcomes. I reviewed the indications, risks, benefits and alternatives. She understands the risks include, but are not limited to death, stroke, MI, DVT/PE, bleeding, possible need for transfusion, infections, air leaks and other organ system dysfunction including respiratory, renal, or GI complications. She accepts the risks and agrees to proceed.  For OR in AM 2/3  [name] 02/07/2015, 4:58 PM

## 2015-02-08 NOTE — Interval H&P Note (Signed)
History and Physical Interval Note:  09/09/7074 1:51 AM  Alejandra Callahan  has presented today for surgery, with the diagnosis of LEFT PTX LEFT PLEURAL EFFUSION  The various methods of treatment have been discussed with the patient and family. After consideration of risks, benefits and other options for treatment, the patient has consented to  Procedure(s): VIDEO ASSISTED THORACOSCOPY, pleural biopsy (Left) DRAINAGE OF PLEURAL EFFUSION (Left) as a surgical intervention .  The patient's history has been reviewed, patient examined, no change in status, stable for surgery.  I have reviewed the patient's chart and labs.  Questions were answered to the patient's satisfaction.     [name]

## 2015-02-08 NOTE — Progress Notes (Addendum)
[facility] PROGRESS NOTE  Alejandra Callahan QVZ:[id number] DOB: 05-08-1945 DOA: 02/06/2015 PCP: [name], MD  Assessment/Plan: Alejandra Callahan is an 70 y.o. 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 Patient was discharged from hospital 1/31 s/p lung biopsy where she was noted to have a pneumothorax. Pneumothorax noted to be stable upon discharge. Per patient, coughing worsens when ambulating and trying to eat. Reports BLE swelling 1/31 as well as swelling of right hand that has resolved prior to admission. She denies any recent swallow studies or diet restriction. Denies CP/ palpitation, back pain, and abd pain. Denies any loss of appetite, N/V/D, and is afebrile. Is on chronic Prednisone PRN for rheumetoid arthritis, last dose was 1/31.  Workup revealed hgb 9.4.  1-Acute hypoxic Respiratory failure , pneumothorax, hydropneumothorax , left lung masses.  Presents with worsening cough and dyspnea.  CVTS consulted. Patient underwent Left assisted thoracoscopy 2-3, drainage of pleural effusion and lung biopsy.  CT negative for PE, hydrothorax.  Nebulizer.  On PCA pump for pain controlled.   Pneumothorax with pleural effusion, s/p biopsy. Malignant neoplasm metastatic to lung CA; Dr Roxan Hockey  Following. S/P chest tube and biopsy of lung mass. Management per Dr Roxan Hockey.   Rheumatoid arthritis, currently taking Prednisone  Possible adrenal insufficiency.  Taper IV solumedrol.   Endometrial CA; follow with oncology Dr Whitney Muse.   DVT prophylasix: Hold heparin,  Per Dr Roxan Hockey note hold anticoagulation for 24 hour. SCD ordered.   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 Will check Anemia panel.   Code Status: full code.  Family Communication: care discussed with patient and daughter who was at  bedside.  Disposition Plan: remain inpatient    Consultants:  Dr Princess Perna   Procedures:  none  Antibiotics:  none  HPI/Subjective: Came for procedure. Sleepy, complaining of left side chest pain , at side of surgery.    Objective: Filed Vitals:   02/08/15 1334 02/08/15 1352  BP:  115/66  Pulse:  99  Temp: 97.4 F (36.3 C) 98.1 F (36.7 C)  Resp:  23    Intake/Output Summary (Last 24 hours) at 02/08/15 1408 Last data filed at 02/08/15 1400  Gross per 24 hour  Intake   1400 ml  Output    970 ml  Net    430 ml   Filed Weights   02/06/15 1822 02/07/15 0220 02/08/15 1352  Weight: 50.803 kg (112 lb) 52.5 kg (115 lb 11.9 oz) 55.2 kg (121 lb 11.1 oz)    Exam:   General:  NAD, sleepy   Cardiovascular: S 1, s 2 RRR  Respiratory: bilateral air movement, no wheezing   Abdomen: bs present, soft,   Musculoskeletal: no edema  Data Reviewed: Basic Metabolic Panel:  Recent Labs Lab 02/06/15 1918 02/07/15 0256 02/08/15 0640  NA 137  --  143  K 3.9  --  3.7  CL 104  --  106  CO2 26  --  23  GLUCOSE 99  --  156*  BUN 18  --  8  CREATININE 0.58 0.54 0.57  CALCIUM 8.6*  --  8.8*   Liver Function Tests: No results for input(s): AST, ALT, ALKPHOS, BILITOT, PROT, ALBUMIN in the last 168 hours. No results for input(s): LIPASE, AMYLASE in the last 168 hours. No results for input(s): AMMONIA in the last  168 hours. CBC:  Recent Labs Lab 02/06/15 1918 02/07/15 0256 02/07/15 2100 02/08/15 0640  WBC 7.8 7.2 8.4 11.4*  NEUTROABS 4.7  --   --   --   HGB 9.4* 9.4* 9.4* 9.3*  HCT 28.1* 27.8* 27.9* 27.9*  MCV 107.7* 106.1* 106.1* 106.5*  PLT 220 244 268 255   Cardiac Enzymes:  Recent Labs Lab 02/06/15 1918  TROPONINI <0.03   BNP (last 3 results)  Recent Labs  02/06/15 1918  BNP 61.0    ProBNP (last 3 results) No results for input(s): PROBNP in the last 8760 hours.  CBG: No results for input(s): GLUCAP in the last 168 hours.  Recent Results  (from the past 240 hour(s))  Surgical pcr screen     Status: None   Collection Time: 02/08/15  2:04 AM  Result Value Ref Range Status   MRSA, PCR NEGATIVE NEGATIVE Final   Staphylococcus aureus NEGATIVE NEGATIVE Final    Comment:        The Xpert SA Assay (FDA approved for NASAL specimens in patients over 53 years of age), is one component of a comprehensive surveillance program.  Test performance has been validated by [facility] for patients greater than or equal to 27 year old. It is not intended to diagnose infection nor to guide or monitor treatment.   Gram stain     Status: None   Collection Time: 02/08/15 11:01 AM  Result Value Ref Range Status   Specimen Description FLUID LEFT PLEURAL  Final   Special Requests NONE  Final   Gram Stain   Final    MODERATE WBC PRESENT,BOTH PMN AND MONONUCLEAR NO ORGANISMS SEEN    Report Status 02/08/2015 FINAL  Final     Studies: Dg Chest 2 View  02/06/2015  CLINICAL DATA:  70 year old female with increasing shortness of breath. History of lung biopsy 2 days ago. Nonproductive cough for the past 7 months. EXAM: CHEST  2 VIEW COMPARISON:  Chest x-ray 02/04/2014. FINDINGS: Large left pleural effusion. Multiple known pleural-based masses in the left hemithorax are difficult to discretely visualize on today's examination secondary to the large left pleural effusion (see chest CT 01/24/2015 for further details). Right lung is clear. No right pleural effusion. Small right-sided pneumothorax, slightly decreased in size compared with yesterday's examination. No evidence of pulmonary edema. Cardiac silhouette is largely obscured, but heart size appears normal. Mediastinal contours are distorted by patient's positioning. Atherosclerosis in the thoracic aorta. Right internal jugular single-lumen porta cath with tip terminating at the superior cavoatrial junction. IMPRESSION: 1. Slight decrease in pneumothorax component of left-sided hydropneumothorax  compared to yesterday's examination. The appearance the chest is otherwise essentially unchanged, as discussed above. Electronically Signed   By: Vinnie Langton M.D.   On: 02/06/2015 18:48   Ct Angio Chest Pe W/cm &/or Wo Cm  02/06/2015  CLINICAL DATA:  Cough and shortness of breath for 2 days. Symptoms after having bronchoscopy. Patient with history of left breast cancer and endometrial cancer with lung metastasis. EXAM: CT ANGIOGRAPHY CHEST WITH CONTRAST TECHNIQUE: Multidetector CT imaging of the chest was performed using the standard protocol during bolus administration of intravenous contrast. Multiplanar CT image reconstructions and MIPs were obtained to evaluate the vascular anatomy. CONTRAST:  128m OMNIPAQUE IOHEXOL 350 MG/ML SOLN COMPARISON:  Most recent chest CT 01/24/2015. Most recent chest radiograph earlier this day at 1824 hour FINDINGS: There are no filling defects within the pulmonary arteries to suggest pulmonary embolus, with limitations secondary to breathing motion  artifact (particularly in the right lung base) and left lung atelectasis. A questionable filling defect seen only on a single image series 10 image 163 is felt to be artifactual. Small left anterior apical pneumothorax. Left pleural effusion with increase from CT 13 days prior, now moderate in size. There is no definite pleural nodularity. Pleural effusion causes marked atelectasis in the left lower and to a lesser extent dependent left upper lobe. No right pleural effusion. The left paramediastinal masses have increased in size in the short interim, current measurement 6.0 x 9.0 cm (previously 7.0 x 4.7 cm), of the superior lesion; more inferior lesion also increased in size 6.4 x 5.0 cm (previously 5.2 x 3.2 cm). Masslike density between these 2 lesions has also increased. Right lower lobe pulmonary nodule measures 12 x 11 mm, previously 7 x 7 mm, with adjacent cystic space unchanged. There is mild rightward mediastinal shift,  new from prior CT. No definite mediastinal adenopathy. No pericardial effusion. Esophagus is patulous and fluid-filled. Unchanged appearance of the upper abdomen. 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 No focal osseous lesions are seen. Review of the MIP images confirms the above findings. IMPRESSION: 1. No pulmonary embolus allowing for limitations secondary to breathing artifact and left pleural effusion. 2. Left hydro pneumothorax, with small pneumothorax component anterior apical and moderate hydrothorax. Pleural fluid measures simple fluid density. 3. Increased size of the left paramediastinal masses in the short interim (13 days). Right lower lobe 11 mm pulmonary nodule has also increased in the interim. 4. There is mild rightward mediastinal shift from prior CT, unclear whether this is secondary to pleural effusion, left-sided pulmonary masses, or pneumothorax, though the relative size of pneumothorax component compared to pleural effusion argues against this as etiology. These results were called by telephone at the time of interpretation on 02/06/2015 at 9:24 pm to Dr. Francine Graven , who verbally acknowledged these results. Electronically Signed   By: Jeb Levering M.D.   On: 02/06/2015 21:26   Dg Chest Port 1 View  02/08/2015  CLINICAL DATA:  Postop EXAM: PORTABLE CHEST 1 VIEW COMPARISON:  02/06/2015 FINDINGS: There are now 2 left chest tubes. No pneumothorax. Large mass in the left mid and lower lung zones is stable. There is less density over the left lower lung zone likely related to resolution of the left pleural effusion. Right jugular Port-A-Cath is stable. Linear atelectasis in the right mid and lower lung zones. Cardiomegaly. IMPRESSION: Left chest tubes are now in place and there is no left pneumothorax. Subsegmental atelectasis in the right mid and lower lung zones. Resolved left pleural effusion. Electronically Signed   By: Marybelle Killings M.D.   On: 02/08/2015 12:32   Dg Swallowing  Func-speech Pathology  02/07/2015  Objective Swallowing Evaluation: Type of Study: MBS-Modified Barium Swallow Study Patient Details Name: JERLISA DILIBERTO MRN: [id number] Date of Birth: [birth year] Today's Date: 02/07/2015 Time: SLP Start Time (ACUTE ONLY): 0948-SLP Stop Time (ACUTE ONLY): 1000 SLP Time Calculation (min) (ACUTE ONLY): 12 min Past Medical History: Past Medical History Diagnosis Date . Cancer (HCC)    breast . Arthritis    Rheumatoid arthritis . PONV (postoperative nausea and vomiting) 1982 . Breast cancer ([place]) 09/1980   left unilateral mastectomy, no chemo or radiatioin . Endometrial cancer ([place]) 2014 . Lung mass  . Pneumonia    hx Past Surgical History: Past Surgical History Procedure Laterality Date . Mastectomy Left 09/1980 . Tubal ligation  April 1982 . Dilatation & currettage/hysteroscopy with resectocope N/A  11/18/2012   Procedure: DILATATION & CURETTAGE/HYSTEROSCOPY WITH RESECTOCOPE;  Surgeon: Marvene Staff, MD;  Location: [facility];  Service: Gynecology;  Laterality: N/A; . Breast biopsy Left 09/11/80 . Vaginal hysterectomy  12/09/2012   Complete hysterectomy - [place] placement Right 8/16 . Abdominal hysterectomy   . Lung biopsy  01/18/15 . Video bronchoscopy with endobronchial navigation N/A 02/04/2015   Procedure: VIDEO BRONCHOSCOPY WITH ENDOBRONCHIAL NAVIGATION;  Surgeon: [name], MD;  Location: [facility];  Service: Thoracic;  Laterality: N/A; HPI: Alejandra Callahan is an 70 y.o. 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 Patient was discharged from hospital 1/31 s/p lung biopsy where she was noted to have a pneumothorax. Pneumothorax noted to be stable upon discharge. Per patient, coughing worsens when ambulating and trying to eat. MD questioning aspiration and bronchospasm as cause of acute decompensation.  No Data Recorded Assessment / Plan / Recommendation CHL IP CLINICAL IMPRESSIONS 02/07/2015  Therapy Diagnosis WFL Clinical Impression Patient presents with largely normal oropharyngeal swallowing function with intermittent delay in swallow initiation but with good airway protection . No frank penetration or aspiration observed. Esophageal sweep revealed no significant abnormalities, possible mild stasis in lower esophagus however MD not present to confirm.  Impact on safety and function Mild aspiration risk   CHL IP TREATMENT RECOMMENDATION 02/07/2015 Treatment Recommendations No treatment recommended at this time   No flowsheet data found. CHL IP DIET RECOMMENDATION 02/07/2015 SLP Diet Recommendations Regular solids;Thin liquid Liquid Administration via Cup;Straw Medication Administration Whole meds with liquid Compensations Slow rate;Small sips/bites Postural Changes Remain semi-upright after after feeds/meals (Comment);Seated upright at 90 degrees   CHL IP OTHER RECOMMENDATIONS 02/07/2015 Recommended Consults -- Oral Care Recommendations Oral care BID Other Recommendations --   CHL IP FOLLOW UP RECOMMENDATIONS 02/07/2015 Follow up Recommendations None   No flowsheet data found.     CHL IP ORAL PHASE 02/07/2015 Oral Phase WFL Oral - Pudding Teaspoon -- Oral - Pudding Cup -- Oral - Honey Teaspoon -- Oral - Honey Cup -- Oral - Nectar Teaspoon -- Oral - Nectar Cup -- Oral - Nectar Straw -- Oral - Thin Teaspoon -- Oral - Thin Cup -- Oral - Thin Straw -- Oral - Puree -- Oral - Mech Soft -- Oral - Regular -- Oral - Multi-Consistency -- Oral - Pill -- Oral Phase - Comment --  CHL IP PHARYNGEAL PHASE 02/07/2015 Pharyngeal Phase WFL Pharyngeal- Pudding Teaspoon -- Pharyngeal -- Pharyngeal- Pudding Cup -- Pharyngeal -- Pharyngeal- Honey Teaspoon -- Pharyngeal -- Pharyngeal- Honey Cup -- Pharyngeal -- Pharyngeal- Nectar Teaspoon -- Pharyngeal -- Pharyngeal- Nectar Cup -- Pharyngeal -- Pharyngeal- Nectar Straw -- Pharyngeal -- Pharyngeal- Thin Teaspoon -- Pharyngeal -- Pharyngeal- Thin Cup -- Pharyngeal -- Pharyngeal- Thin  Straw -- Pharyngeal -- Pharyngeal- Puree -- Pharyngeal -- Pharyngeal- Mechanical Soft -- Pharyngeal -- Pharyngeal- Regular -- Pharyngeal -- Pharyngeal- Multi-consistency -- Pharyngeal -- Pharyngeal- Pill -- Pharyngeal -- Pharyngeal Comment --  CHL IP CERVICAL ESOPHAGEAL PHASE 02/07/2015 Cervical Esophageal Phase WFL Pudding Teaspoon -- Pudding Cup -- Honey Teaspoon -- Honey Cup -- Nectar Teaspoon -- Nectar Cup -- Nectar Straw -- Thin Teaspoon -- Thin Cup -- Thin Straw -- Puree -- Mechanical Soft -- Regular -- Multi-consistency -- Pill -- Cervical Esophageal Comment -- No flowsheet data found. [place], CCC-SLP [phone] Gabriel Rainwater Meryl 02/07/2015, 12:28 PM  Scheduled Meds: . acetaminophen  1,000 mg Oral 4 times per day   Or  . acetaminophen (TYLENOL) oral liquid 160 mg/5 mL  1,000 mg Oral 4 times per day  . albuterol  2.5 mg Nebulization BID  . benzonatate  200 mg Oral TID  . bisacodyl  10 mg Oral Daily  . fentaNYL      . fentaNYL   Intravenous 6 times per day  . folic acid  1 mg Oral Daily  . heparin  5,000 Units Subcutaneous 3 times per day  . Influenza vac split quadrivalent PF  0.5 mL Intramuscular Once  . lung surgery book   Does not apply Once  . methylPREDNISolone (SOLU-MEDROL) injection  40 mg Intravenous Q12H  . metoCLOPramide (REGLAN) injection  10 mg Intravenous 4 times per day  . multivitamin with minerals  1 tablet Oral Daily  . raloxifene  60 mg Oral Daily  . scopolamine  1 patch Transdermal Q72H  . senna-docusate  1 tablet Oral QHS  . sodium chloride flush  3 mL Intravenous Q12H  . vancomycin  1,000 mg Intravenous Q12H   Continuous Infusions: . 0.9 % NaCl with KCl 20 mEq / L    . dextrose 5 % and 0.9% NaCl 50 mL/hr at 02/07/15 0232    Active Problems:   SOB (shortness of breath)    Time spent: 25 minutes.     Niel Hummer A  [facility] Pager [phone]. If 7PM-7AM, please contact night-coverage at [email or web address], password  [facility] 02/08/2015, 2:08 PM  LOS: 2 days

## 2015-02-09 ENCOUNTER — Inpatient Hospital Stay (HOSPITAL_COMMUNITY): Payer: Medicare Other

## 2015-02-09 LAB — CBC
HEMATOCRIT: 26.2 % — AB (ref 36.0–46.0)
Hemoglobin: 8.7 g/dL — ABNORMAL LOW (ref 12.0–15.0)
MCH: 35.7 pg — AB (ref 26.0–34.0)
MCHC: 33.2 g/dL (ref 30.0–36.0)
MCV: 107.4 fL — ABNORMAL HIGH (ref 78.0–100.0)
Platelets: 251 10*3/uL (ref 150–400)
RBC: 2.44 MIL/uL — ABNORMAL LOW (ref 3.87–5.11)
RDW: 15.6 % — AB (ref 11.5–15.5)
WBC: 13.1 10*3/uL — ABNORMAL HIGH (ref 4.0–10.5)

## 2015-02-09 LAB — VITAMIN B12: Vitamin B-12: 574 pg/mL (ref 180–914)

## 2015-02-09 LAB — FERRITIN: FERRITIN: 250 ng/mL (ref 11–307)

## 2015-02-09 LAB — BLOOD GAS, ARTERIAL
ACID-BASE EXCESS: 1.1 mmol/L (ref 0.0–2.0)
Bicarbonate: 24.9 mEq/L — ABNORMAL HIGH (ref 20.0–24.0)
Drawn by: 225631
O2 Content: 2 L/min
O2 SAT: 95.4 %
PATIENT TEMPERATURE: 98.6
TCO2: 26.1 mmol/L (ref 0–100)
pCO2 arterial: 37.9 mmHg (ref 35.0–45.0)
pH, Arterial: 7.433 (ref 7.350–7.450)
pO2, Arterial: 75.1 mmHg — ABNORMAL LOW (ref 80.0–100.0)

## 2015-02-09 LAB — BASIC METABOLIC PANEL
ANION GAP: 11 (ref 5–15)
BUN: 9 mg/dL (ref 6–20)
CALCIUM: 8.6 mg/dL — AB (ref 8.9–10.3)
CO2: 23 mmol/L (ref 22–32)
Chloride: 104 mmol/L (ref 101–111)
Creatinine, Ser: 0.57 mg/dL (ref 0.44–1.00)
GLUCOSE: 136 mg/dL — AB (ref 65–99)
Potassium: 4.4 mmol/L (ref 3.5–5.1)
Sodium: 138 mmol/L (ref 135–145)

## 2015-02-09 LAB — FOLATE: FOLATE: 14.7 ng/mL (ref >5.9)

## 2015-02-09 LAB — RETICULOCYTES
RBC.: 2.44 MIL/uL — AB (ref 3.87–5.11)
RETIC COUNT ABSOLUTE: 80.5 10*3/uL (ref 19.0–186.0)
Retic Ct Pct: 3.3 % — ABNORMAL HIGH (ref 0.4–3.1)

## 2015-02-09 LAB — IRON AND TIBC
IRON: 25 ug/dL — AB (ref 28–170)
Saturation Ratios: 10 % — ABNORMAL LOW (ref 10.4–31.8)
TIBC: 242 ug/dL — ABNORMAL LOW (ref 250–450)
UIBC: 217 ug/dL

## 2015-02-09 LAB — MAGNESIUM: MAGNESIUM: 1.9 mg/dL (ref 1.7–2.4)

## 2015-02-09 MED ORDER — POLYSACCHARIDE IRON COMPLEX 150 MG PO CAPS
150.0000 mg | ORAL_CAPSULE | Freq: Every day | ORAL | Status: DC
  Administered 2015-02-09 – 2015-02-14 (×6): 150 mg via ORAL
  Filled 2015-02-09 (×7): qty 1

## 2015-02-09 MED ORDER — VITAMIN D 1000 UNITS PO TABS
1000.0000 [IU] | ORAL_TABLET | Freq: Every day | ORAL | Status: DC
  Administered 2015-02-09 – 2015-02-13 (×5): 1000 [IU] via ORAL
  Filled 2015-02-09 (×5): qty 1

## 2015-02-09 MED ORDER — PREDNISONE 20 MG PO TABS
20.0000 mg | ORAL_TABLET | Freq: Every day | ORAL | Status: DC
  Administered 2015-02-10: 20 mg via ORAL
  Filled 2015-02-09 (×2): qty 1

## 2015-02-09 MED ORDER — CALCIUM CARBONATE-VITAMIN D 500-200 MG-UNIT PO TABS
1.0000 | ORAL_TABLET | Freq: Two times a day (BID) | ORAL | Status: DC
  Administered 2015-02-09 – 2015-02-14 (×11): 1 via ORAL
  Filled 2015-02-09 (×11): qty 1

## 2015-02-09 NOTE — Progress Notes (Addendum)
      [address]       [place] [address]             [phone]      1 Day Post-Op Procedure(s) (LRB): VIDEO ASSISTED THORACOSCOPY, lung biopsy (Left) DRAINAGE OF PLEURAL EFFUSION (Left)   Subjective:  Ms. Alejandra Callahan states she is doing okay.  She is up in chair eating breakfast this morning.  Her pain is well controlled.  Objective: Vital signs in last 24 hours: Temp:  [97.4 F (36.3 C)-98.4 F (36.9 C)] 98.2 F (36.8 C) (02/04 0743) Pulse Rate:  [75-106] 93 (02/04 0743) Cardiac Rhythm:  [-] Normal sinus rhythm (02/04 0743) Resp:  [12-29] 12 (02/04 0753) BP: (100-119)/(60-71) 114/64 mmHg (02/04 0743) SpO2:  [94 %-100 %] 99 % (02/04 0755) Arterial Line BP: (151-155)/(61-63) 155/61 mmHg (02/03 1251) Weight:  [121 lb 11.1 oz (55.2 kg)] 121 lb 11.1 oz (55.2 kg) (02/03 1352)  Intake/Output from previous day: 02/03 0701 - 02/04 0700 In: 1703.5 [I.V.:1703.5] Out: 1790 [Urine:1425; Blood:100; Chest Tube:265] Intake/Output this shift: Total I/O In: -  Out: 20 [Chest Tube:20]  General appearance: alert, cooperative and no distress Heart: regular rate and rhythm and tachy Lungs: diminished breath sounds LLL Abdomen: soft, non-tender; bowel sounds normal; no masses,  no organomegaly Wound: clean and dry  Lab Results:  Recent Labs  02/08/15 0640 02/09/15 0500  WBC 11.4* 13.1*  HGB 9.3* 8.7*  HCT 27.9* 26.2*  PLT 255 251   BMET:  Recent Labs  02/08/15 0640 02/09/15 0500  NA 143 138  K 3.7 4.4  CL 106 104  CO2 23 23  GLUCOSE 156* 136*  BUN 8 9  CREATININE 0.57 0.57  CALCIUM 8.8* 8.6*    PT/INR:  Recent Labs  02/07/15 2100  LABPROT 14.6  INR 1.12   ABG    Component Value Date/Time   PHART 7.433 02/09/2015 0609   HCO3 24.9* 02/09/2015 0609   TCO2 26.1 02/09/2015 0609   ACIDBASEDEF 2.1* 02/07/2015 2330   O2SAT 95.4 02/09/2015 0609   CBG (last 3)  No results for input(s): GLUCAP in the last 72 hours.  Assessment/Plan: S/P  Procedure(s) (LRB): VIDEO ASSISTED THORACOSCOPY, lung biopsy (Left) DRAINAGE OF PLEURAL EFFUSION (Left)  1. Chest tube- no air leak, will d/c chest tube today, leave blake drain in place until output decreases (265 cc since surgery) 2. Pulm - wean oxygen as tolerated, continue nebs, IS- CXR shows stable appearance of lung mass, no pneumothorax 3. Decrease IV fluid to KVO 4. Pathology pending 5. Dispo- patient stable, repeat CXR in AM, care per primary   LOS: 3 days    BARRETT, ERIN 02/09/2015   One tube out today  Path still pending patient still asking I have seen and examined Alejandra Callahan and agree with the above assessment  and plan.  Grace Isaac MD Beeper [phone] Office [phone]

## 2015-02-09 NOTE — Progress Notes (Signed)
[facility] PROGRESS NOTE  Alejandra Callahan YNW:[id number] DOB: [birth year] DOA: 02/06/2015 PCP: [name], MD  Assessment/Plan: Alejandra Callahan is an 70 y.o. 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 Patient was discharged from hospital 1/31 s/p lung biopsy where she was noted to have a pneumothorax. Pneumothorax noted to be stable upon discharge. Per patient, coughing worsens when ambulating and trying to eat. Reports BLE swelling 1/31 as well as swelling of right hand that has resolved prior to admission. She denies any recent swallow studies or diet restriction. Denies CP/ palpitation, back pain, and abd pain. Denies any loss of appetite, N/V/D, and is afebrile. Is on chronic Prednisone PRN for rheumetoid arthritis, last dose was 1/31.  Workup revealed hgb 9.4.  1-Acute hypoxic Respiratory failure , pneumothorax, hydropneumothorax , left lung masses.  Presents with worsening cough and dyspnea.  CVTS consulted. Patient underwent Left assisted thoracoscopy 2-3, drainage of pleural effusion and lung biopsy.  CT negative for PE, hydrothorax.  Nebulizer.  On PCA pump for pain controlled.   Pneumothorax with pleural effusion, s/p biopsy. Malignant neoplasm metastatic to lung CA; Dr Roxan Hockey  Following. S/P chest tube and biopsy of lung mass. Management per Dr Roxan Hockey.  Plan to remove chest tube today and continue with blake drain.   Rheumatoid arthritis, currently taking Prednisone, takes as needed 5 mg   Possible adrenal insufficiency.  Taper IV solumedrol, change to prednisone   Endometrial CA; follow with oncology Dr Whitney Muse.   DVT prophylasix: Hold heparin,  Per Dr Roxan Hockey note hold anticoagulation for 24 hour. SCD ordered.   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 Anemia panel: B 12  574, Folate 14, iron 25, ferritin 250. Iron deficiency anemia. Start ferrous sulfate   Code Status: full code.  Family Communication: care discussed with patient and sister  who was at bedside.  Disposition Plan: remain inpatient    Consultants:  Dr Princess Perna   Procedures:  none  Antibiotics:  none  HPI/Subjective: Feeling better, pain better controlled.    Objective: Filed Vitals:   02/09/15 0751 02/09/15 0753  BP:    Pulse:    Temp:    Resp: 12 12    Intake/Output Summary (Last 24 hours) at 02/09/15 1131 Last data filed at 02/09/15 0743  Gross per 24 hour  Intake  703.5 ml  Output   1660 ml  Net -956.5 ml   Filed Weights   02/06/15 1822 02/07/15 0220 02/08/15 1352  Weight: 50.803 kg (112 lb) 52.5 kg (115 lb 11.9 oz) 55.2 kg (121 lb 11.1 oz)    Exam:   General: NAD, sleepy   Cardiovascular: S 1, s 2 RRR  Respiratory: bilateral air movement, no wheezing   Abdomen: bs present, soft,   Musculoskeletal: no edema  Data Reviewed: Basic Metabolic Panel:  Recent Labs Lab 02/06/15 1918 02/07/15 0256 02/08/15 0640 02/09/15 0500  NA 137  --  143 138  K 3.9  --  3.7 4.4  CL 104  --  106 104  CO2 26  --  23 23  GLUCOSE 99  --  156* 136*  BUN 18  --  8 9  CREATININE 0.58 0.54 0.57 0.57  CALCIUM 8.6*  --  8.8* 8.6*  MG  --   --   --  1.9   Liver Function  Tests: No results for input(s): AST, ALT, ALKPHOS, BILITOT, PROT, ALBUMIN in the last 168 hours. No results for input(s): LIPASE, AMYLASE in the last 168 hours. No results for input(s): AMMONIA in the last 168 hours. CBC:  Recent Labs Lab 02/06/15 1918 02/07/15 0256 02/07/15 2100 02/08/15 0640 02/09/15 0500  WBC 7.8 7.2 8.4 11.4* 13.1*  NEUTROABS 4.7  --   --   --   --   HGB 9.4* 9.4* 9.4* 9.3* 8.7*  HCT 28.1* 27.8* 27.9* 27.9* 26.2*  MCV 107.7* 106.1* 106.1* 106.5* 107.4*  PLT 220 244 268 255 251   Cardiac Enzymes:  Recent Labs Lab 02/06/15 1918  TROPONINI <0.03   BNP (last 3  results)  Recent Labs  02/06/15 1918  BNP 61.0    ProBNP (last 3 results) No results for input(s): PROBNP in the last 8760 hours.  CBG: No results for input(s): GLUCAP in the last 168 hours.  Recent Results (from the past 240 hour(s))  Surgical pcr screen     Status: None   Collection Time: 02/08/15  2:04 AM  Result Value Ref Range Status   MRSA, PCR NEGATIVE NEGATIVE Final   Staphylococcus aureus NEGATIVE NEGATIVE Final    Comment:        The Xpert SA Assay (FDA approved for NASAL specimens in patients over 41 years of age), is one component of a comprehensive surveillance program.  Test performance has been validated by [facility] for patients greater than or equal to 44 year old. It is not intended to diagnose infection nor to guide or monitor treatment.   Gram stain     Status: None   Collection Time: 02/08/15 11:01 AM  Result Value Ref Range Status   Specimen Description FLUID LEFT PLEURAL  Final   Special Requests NONE  Final   Gram Stain   Final    MODERATE WBC PRESENT,BOTH PMN AND MONONUCLEAR NO ORGANISMS SEEN    Report Status 02/08/2015 FINAL  Final     Studies: Dg Chest Port 1 View  02/09/2015  CLINICAL DATA:  Pleural effusions EXAM: PORTABLE CHEST 1 VIEW COMPARISON:  02/08/2015 FINDINGS: RIGHT power port. 2 LEFT chest tubes at the LEFT apex. No appreciable pneumothorax. Large LEFT lung mass again demonstrated. LEFT effusion unchanged. A patchy nodularity in the RIGHT lung. IMPRESSION: 1. Stable support apparatus. 2. No interval change. 3. Large LEFT lung mass. 4. Patchy nodularity in the RIGHT lung. Electronically Signed   By: Suzy Bouchard M.D.   On: 02/09/2015 09:44   Dg Chest Port 1 View  02/08/2015  CLINICAL DATA:  Postop EXAM: PORTABLE CHEST 1 VIEW COMPARISON:  02/06/2015 FINDINGS: There are now 2 left chest tubes. No pneumothorax. Large mass in the left mid and lower lung zones is stable. There is less density over the left lower lung zone likely  related to resolution of the left pleural effusion. Right jugular Port-A-Cath is stable. Linear atelectasis in the right mid and lower lung zones. Cardiomegaly. IMPRESSION: Left chest tubes are now in place and there is no left pneumothorax. Subsegmental atelectasis in the right mid and lower lung zones. Resolved left pleural effusion. Electronically Signed   By: Marybelle Killings M.D.   On: 02/08/2015 12:32    Scheduled Meds: . acetaminophen  1,000 mg Oral 4 times per day   Or  . acetaminophen (TYLENOL) oral liquid 160 mg/5 mL  1,000 mg Oral 4 times per day  . albuterol  2.5 mg Nebulization BID  . antiseptic  oral rinse  7 mL Mouth Rinse BID  . benzonatate  200 mg Oral TID  . bisacodyl  10 mg Oral Daily  . fentaNYL   Intravenous 6 times per day  . folic acid  1 mg Oral Daily  . Influenza vac split quadrivalent PF  0.5 mL Intramuscular Once  . lung surgery book   Does not apply Once  . methylPREDNISolone (SOLU-MEDROL) injection  40 mg Intravenous Q12H  . metoCLOPramide (REGLAN) injection  10 mg Intravenous 4 times per day  . multivitamin with minerals  1 tablet Oral Daily  . raloxifene  60 mg Oral Daily  . scopolamine  1 patch Transdermal Q72H  . senna-docusate  1 tablet Oral QHS  . sodium chloride flush  3 mL Intravenous Q12H   Continuous Infusions: . 0.9 % NaCl with KCl 20 mEq / L 10 mL/hr (02/09/15 0855)    Active Problems:   Pneumothorax   SOB (shortness of breath)   Lung mass   Pleural effusion, left   Acute respiratory failure with hypoxia (HCC)   Anemia due to other cause    Time spent: 25 minutes.     Niel Hummer A  [facility] Pager [phone]. If 7PM-7AM, please contact night-coverage at [email or web address], password [facility] 02/09/2015, 11:31 AM  LOS: 3 days

## 2015-02-10 ENCOUNTER — Inpatient Hospital Stay (HOSPITAL_COMMUNITY): Payer: Medicare Other

## 2015-02-10 LAB — CBC
HCT: 27.8 % — ABNORMAL LOW (ref 36.0–46.0)
Hemoglobin: 9.1 g/dL — ABNORMAL LOW (ref 12.0–15.0)
MCH: 35.5 pg — AB (ref 26.0–34.0)
MCHC: 32.7 g/dL (ref 30.0–36.0)
MCV: 108.6 fL — AB (ref 78.0–100.0)
PLATELETS: 266 10*3/uL (ref 150–400)
RBC: 2.56 MIL/uL — AB (ref 3.87–5.11)
RDW: 15.9 % — AB (ref 11.5–15.5)
WBC: 11.6 10*3/uL — ABNORMAL HIGH (ref 4.0–10.5)

## 2015-02-10 LAB — COMPREHENSIVE METABOLIC PANEL
ALT: 16 U/L (ref 14–54)
ANION GAP: 6 (ref 5–15)
AST: 24 U/L (ref 15–41)
Albumin: 2.1 g/dL — ABNORMAL LOW (ref 3.5–5.0)
Alkaline Phosphatase: 65 U/L (ref 38–126)
BUN: 15 mg/dL (ref 6–20)
CHLORIDE: 103 mmol/L (ref 101–111)
CO2: 29 mmol/L (ref 22–32)
CREATININE: 0.57 mg/dL (ref 0.44–1.00)
Calcium: 8.6 mg/dL — ABNORMAL LOW (ref 8.9–10.3)
Glucose, Bld: 90 mg/dL (ref 65–99)
Potassium: 4.2 mmol/L (ref 3.5–5.1)
SODIUM: 138 mmol/L (ref 135–145)
Total Bilirubin: 0.3 mg/dL (ref 0.3–1.2)
Total Protein: 4.5 g/dL — ABNORMAL LOW (ref 6.5–8.1)

## 2015-02-10 MED ORDER — SODIUM CHLORIDE 0.9% FLUSH: 10.0000 mL | INTRAVENOUS | Status: DC | PRN

## 2015-02-10 MED ORDER — ENSURE ENLIVE PO LIQD
237.0000 mL | Freq: Two times a day (BID) | ORAL | Status: DC
  Administered 2015-02-10 – 2015-02-14 (×6): 237 mL via ORAL

## 2015-02-10 MED ORDER — ALBUTEROL SULFATE (2.5 MG/3ML) 0.083% IN NEBU: 2.5000 mg | INHALATION_SOLUTION | RESPIRATORY_TRACT | Status: DC | PRN

## 2015-02-10 NOTE — Progress Notes (Signed)
Patient stated she did not want port-o-cath accessed and would rather have PIV inserted. IV team called to bedside. Patient now states she would like to keep port-o-cath accessed, does not want to be stuck. Dr. Tyrell Antonio informed of patients wished. Per MD ok to keep port--cath accessed.

## 2015-02-10 NOTE — Progress Notes (Addendum)
      [address]       [place] [address]             [phone]      2 Days Post-Op Procedure(s) (LRB): VIDEO ASSISTED THORACOSCOPY, lung biopsy (Left) DRAINAGE OF PLEURAL EFFUSION (Left)   Subjective:  Ms. Alejandra Callahan complains of pain this morning, especially with moving around.  Her IV's are no longer functional and they currently have her PCA running through her port.  Objective: Vital signs in last 24 hours: Temp:  [97.5 F (36.4 C)-98.3 F (36.8 C)] 97.7 F (36.5 C) (02/05 0700) Pulse Rate:  [82-111] 97 (02/05 0744) Cardiac Rhythm:  [-] Sinus tachycardia (02/05 0900) Resp:  [14-24] 20 (02/05 0744) BP: (106-120)/(63-75) 120/71 mmHg (02/05 0744) SpO2:  [95 %-97 %] 97 % (02/05 0752)  Intake/Output from previous day: 02/04 0701 - 02/05 0700 In: 664.6 [P.O.:300; I.V.:364.6] Out: 780 [Urine:650; Chest Tube:130] Intake/Output this shift: Total I/O In: 740 [P.O.:720; I.V.:20] Out: 69 [Chest Tube:70]  General appearance: alert, cooperative and no distress Heart: regular rate and rhythm Lungs: diminished breath sounds LLL Abdomen: soft, non-tender; bowel sounds normal; no masses,  no organomegaly Wound: clean and dry  Lab Results:  Recent Labs  02/09/15 0500 02/10/15 0524  WBC 13.1* 11.6*  HGB 8.7* 9.1*  HCT 26.2* 27.8*  PLT 251 266   BMET:  Recent Labs  02/09/15 0500 02/10/15 0524  NA 138 138  K 4.4 4.2  CL 104 103  CO2 23 29  GLUCOSE 136* 90  BUN 9 15  CREATININE 0.57 0.57  CALCIUM 8.6* 8.6*    PT/INR:  Recent Labs  02/07/15 2100  LABPROT 14.6  INR 1.12   ABG    Component Value Date/Time   PHART 7.433 02/09/2015 0609   HCO3 24.9* 02/09/2015 0609   TCO2 26.1 02/09/2015 0609   ACIDBASEDEF 2.1* 02/07/2015 2330   O2SAT 95.4 02/09/2015 0609   CBG (last 3)  No results for input(s): GLUCAP in the last 72 hours.  Assessment/Plan: S/P Procedure(s) (LRB): VIDEO ASSISTED THORACOSCOPY, lung biopsy (Left) DRAINAGE OF PLEURAL  EFFUSION (Left)  1. Chest tube- + air leak with cough, 200 cc output yesterday ( currently at 500 on Pleurovac)- continue Blake drain to water seal for now 2. Pain control- patient is utilizing PCA, however not sure if its ideal to be using her Port as she does not have IV access, patient did not seem agreeable for further attempts at peripheral IV placement 3. Pathology remains pending 4. Dispo- patient stable, continue Blake drain, continue PCA for now, however may be best to just utilize oral pain medications rather than running through [facility], care per primary   LOS: 4 days    BARRETT, ERIN 02/10/2015  Vascular access difficult using port for pca pump , patient wants to keep pca Leave remaining chest tube in place  Path tomorrow? I have seen and examined [name] and agree with the above assessment  and plan.  Grace Isaac MD Beeper [phone] Office [phone] 02/10/2015 11:01 AM

## 2015-02-10 NOTE — Progress Notes (Signed)
[facility] PROGRESS NOTE  Alejandra Callahan BUL:[id number] DOB: 08-01-1945 DOA: 02/06/2015 PCP: [name], MD  Assessment/Plan: Alejandra Callahan is an 70 y.o. 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 Patient was discharged from hospital 1/31 s/p lung biopsy where she was noted to have a pneumothorax. Pneumothorax noted to be stable upon discharge. Per patient, coughing worsens when ambulating and trying to eat. Reports BLE swelling 1/31 as well as swelling of right hand that has resolved prior to admission. She denies any recent swallow studies or diet restriction. Denies CP/ palpitation, back pain, and abd pain. Denies any loss of appetite, N/V/D, and is afebrile. Is on chronic Prednisone PRN for rheumetoid arthritis, last dose was 1/31.  Workup revealed hgb 9.4.  1-Acute hypoxic Respiratory failure , pneumothorax, hydropneumothorax , left lung masses.  Presents with worsening cough and dyspnea.  CVTS consulted. Patient underwent Left assisted thoracoscopy 2-3, drainage of pleural effusion and lung biopsy.  CT negative for PE, hydrothorax.  Nebulizer.  On PCA pump for pain controlled per CVTS. Weaning today   Pneumothorax with pleural effusion, s/p biopsy. Malignant neoplasm metastatic to lung CA; Dr Roxan Hockey  Following. S/P chest tube and biopsy of lung mass. Management per Dr Roxan Hockey.  chest tube removed  2-04 and continue with blake drain.  Awaiting pathology   Rheumatoid arthritis, currently taking Prednisone, takes as needed 5 mg   Possible adrenal insufficiency.  Taper IV solumedrol, change to prednisone , need to continue taper dose.   Endometrial CA; follow with oncology Dr Whitney Muse.   DVT prophylasix: Hold heparin,  Per Dr Roxan Hockey note hold anticoagulation for 24 hour. SCD ordered.   Anemia; probably  secondary to malignancy chronic diseases, chemotherapy. Anemia panel: B 12 574, Folate 14, iron 25, ferritin 250. Iron deficiency anemia. Started ferrous sulfate   Code Status: full code.  Family Communication: care discussed with patient and sister  who was at bedside.  Disposition Plan: remain inpatient    Consultants:  Dr Princess Perna   Procedures:  none  Antibiotics:  none  HPI/Subjective: Having pain chest side of sx.  Had a bowel movement, was able to urinate.    Objective: Filed Vitals:   02/10/15 1213 02/10/15 1300  BP: 120/67   Pulse: 105   Temp:  98.4 F (36.9 C)  Resp: 21     Intake/Output Summary (Last 24 hours) at 02/10/15 1307 Last data filed at 02/10/15 1200  Gross per 24 hour  Intake   1340 ml  Output   1270 ml  Net     70 ml   Filed Weights   02/06/15 1822 02/07/15 0220 02/08/15 1352  Weight: 50.803 kg (112 lb) 52.5 kg (115 lb 11.9 oz) 55.2 kg (121 lb 11.1 oz)    Exam:   General: NAD, sleepy   Cardiovascular: S 1, s 2 RRR  Respiratory: bilateral air movement, no wheezing , chest; drainage in place  Abdomen: bs present, soft,   Musculoskeletal: no edema  Data Reviewed: Basic Metabolic Panel:  Recent Labs Lab 02/06/15 1918 02/07/15 0256 02/08/15 0640 02/09/15 0500 02/10/15 0524  NA 137  --  143 138 138  K 3.9  --  3.7 4.4 4.2  CL 104  --  106 104 103  CO2 26  --  '23 23 29  '$ GLUCOSE 99  --  156* 136* 90  BUN 18  --  '8 9 15  '$ CREATININE 0.58 0.54 0.57 0.57 0.57  CALCIUM 8.6*  --  8.8* 8.6* 8.6*  MG  --   --   --  1.9  --    Liver Function Tests:  Recent Labs Lab 02/10/15 0524  AST 24  ALT 16  ALKPHOS 65  BILITOT 0.3  PROT 4.5*  ALBUMIN 2.1*   No results for input(s): LIPASE, AMYLASE in the last 168 hours. No results for input(s): AMMONIA in the last 168 hours. CBC:  Recent Labs Lab 02/06/15 1918 02/07/15 0256 02/07/15 2100 02/08/15 0640 02/09/15 0500 02/10/15 0524  WBC 7.8 7.2 8.4 11.4* 13.1* 11.6*   NEUTROABS 4.7  --   --   --   --   --   HGB 9.4* 9.4* 9.4* 9.3* 8.7* 9.1*  HCT 28.1* 27.8* 27.9* 27.9* 26.2* 27.8*  MCV 107.7* 106.1* 106.1* 106.5* 107.4* 108.6*  PLT 220 244 268 255 251 266   Cardiac Enzymes:  Recent Labs Lab 02/06/15 1918  TROPONINI <0.03   BNP (last 3 results)  Recent Labs  02/06/15 1918  BNP 61.0    ProBNP (last 3 results) No results for input(s): PROBNP in the last 8760 hours.  CBG: No results for input(s): GLUCAP in the last 168 hours.  Recent Results (from the past 240 hour(s))  Surgical pcr screen     Status: None   Collection Time: 02/08/15  2:04 AM  Result Value Ref Range Status   MRSA, PCR NEGATIVE NEGATIVE Final   Staphylococcus aureus NEGATIVE NEGATIVE Final    Comment:        The Xpert SA Assay (FDA approved for NASAL specimens in patients over 32 years of age), is one component of a comprehensive surveillance program.  Test performance has been validated by [facility] for patients greater than or equal to 85 year old. It is not intended to diagnose infection nor to guide or monitor treatment.   Fungus Culture with Smear     Status: None (Preliminary result)   Collection Time: 02/08/15 11:01 AM  Result Value Ref Range Status   Specimen Description FLUID PLEURAL LEFT  Final   Special Requests NONE  Final   Fungal Smear   Final    NO YEAST OR FUNGAL ELEMENTS SEEN Performed at [facility]    Culture   Final    CULTURE IN PROGRESS FOR FOUR WEEKS Performed at [facility]    Report Status PENDING  Incomplete  AFB culture with smear (NOT at [facility])     Status: None (Preliminary result)   Collection Time: 02/08/15 11:01 AM  Result Value Ref Range Status   Specimen Description FLUID PLEURAL LEFT  Final   Special Requests NONE  Final   Acid Fast Smear   Final    NO ACID FAST BACILLI SEEN Performed at [facility]    Culture   Final    CULTURE WILL BE EXAMINED FOR 6 WEEKS BEFORE ISSUING A FINAL  REPORT Performed at [facility]    Report Status PENDING  Incomplete  Culture, body fluid-bottle     Status: None (Preliminary result)   Collection Time: 02/08/15 11:01 AM  Result Value Ref Range Status   Specimen Description FLUID LEFT PLEURAL  Final   Special Requests NONE  Final   Culture NO GROWTH 1 DAY  Final   Report Status PENDING  Incomplete  Gram stain  Status: None   Collection Time: 02/08/15 11:01 AM  Result Value Ref Range Status   Specimen Description FLUID LEFT PLEURAL  Final   Special Requests NONE  Final   Gram Stain   Final    MODERATE WBC PRESENT,BOTH PMN AND MONONUCLEAR NO ORGANISMS SEEN    Report Status 02/08/2015 FINAL  Final     Studies: Dg Chest Port 1 View  02/10/2015  CLINICAL DATA:  Patient with left-sided chest tube. History of pneumonia. Lung mass. EXAM: PORTABLE CHEST 1 VIEW COMPARISON:  Chest radiograph 02/09/2015 FINDINGS: Central venous catheter tip projects over the superior vena cava, unchanged. Interval removal of 1 of the 2 left chest tubes. New small left pneumothorax. Re- demonstrated large left lung mass. Persistent small left pleural effusion. Unchanged heterogeneous opacities right lung base. IMPRESSION: Interval removal of 1 of the left-sided chest tubes. Single chest tube remains in place. There is a new small left pneumothorax. Unchanged large left lung mass and pleural effusion. Critical Value/emergent results were called by telephone at the time of interpretation on 02/10/2015 at 10:38 am to Dr. Ellwood Handler , who verbally acknowledged these results. Electronically Signed   By: Lovey Newcomer M.D.   On: 02/10/2015 10:39   Dg Chest Port 1 View  02/09/2015  CLINICAL DATA:  Pleural effusions EXAM: PORTABLE CHEST 1 VIEW COMPARISON:  02/08/2015 FINDINGS: RIGHT power port. 2 LEFT chest tubes at the LEFT apex. No appreciable pneumothorax. Large LEFT lung mass again demonstrated. LEFT effusion unchanged. A patchy nodularity in the RIGHT lung.  IMPRESSION: 1. Stable support apparatus. 2. No interval change. 3. Large LEFT lung mass. 4. Patchy nodularity in the RIGHT lung. Electronically Signed   By: Suzy Bouchard M.D.   On: 02/09/2015 09:44    Scheduled Meds: . acetaminophen  1,000 mg Oral 4 times per day   Or  . acetaminophen (TYLENOL) oral liquid 160 mg/5 mL  1,000 mg Oral 4 times per day  . albuterol  2.5 mg Nebulization BID  . antiseptic oral rinse  7 mL Mouth Rinse BID  . benzonatate  200 mg Oral TID  . bisacodyl  10 mg Oral Daily  . calcium-vitamin D  1 tablet Oral BID  . cholecalciferol  1,000 Units Oral QHS  . fentaNYL   Intravenous 6 times per day  . folic acid  1 mg Oral Daily  . Influenza vac split quadrivalent PF  0.5 mL Intramuscular Once  . iron polysaccharides  150 mg Oral Daily  . lung surgery book   Does not apply Once  . multivitamin with minerals  1 tablet Oral Daily  . predniSONE  20 mg Oral Q breakfast  . raloxifene  60 mg Oral Daily  . scopolamine  1 patch Transdermal Q72H  . senna-docusate  1 tablet Oral QHS  . sodium chloride flush  3 mL Intravenous Q12H   Continuous Infusions: . 0.9 % NaCl with KCl 20 mEq / L 10 mL/hr at 02/10/15 0700    Active Problems:   Pneumothorax   SOB (shortness of breath)   Lung mass   Pleural effusion, left   Acute respiratory failure with hypoxia (HCC)   Anemia due to other cause    Time spent: 25 minutes.     Niel Hummer A  [facility] Pager [phone]. If 7PM-7AM, please contact night-coverage at [email or web address], password [facility] 02/10/2015, 1:07 PM  LOS: 4 days

## 2015-02-11 ENCOUNTER — Inpatient Hospital Stay (HOSPITAL_COMMUNITY): Payer: Medicare Other

## 2015-02-11 MED ORDER — SENNOSIDES-DOCUSATE SODIUM 8.6-50 MG PO TABS: 1.0000 | ORAL_TABLET | Freq: Every evening | ORAL | Status: DC | PRN

## 2015-02-11 MED ORDER — HYDROMORPHONE HCL 2 MG PO TABS: 2.0000 mg | ORAL_TABLET | ORAL | Status: DC | PRN

## 2015-02-11 MED ORDER — BISACODYL 5 MG PO TBEC: 10.0000 mg | DELAYED_RELEASE_TABLET | Freq: Every day | ORAL | Status: DC | PRN

## 2015-02-11 MED ORDER — PREDNISONE 10 MG PO TABS
10.0000 mg | ORAL_TABLET | Freq: Every day | ORAL | Status: DC
  Administered 2015-02-12 – 2015-02-13 (×2): 10 mg via ORAL
  Filled 2015-02-11 (×2): qty 1

## 2015-02-11 NOTE — Progress Notes (Addendum)
      [address]       [place] [address]             [phone]      3 Days Post-Op Procedure(s) (LRB): VIDEO ASSISTED THORACOSCOPY, lung biopsy (Left) DRAINAGE OF PLEURAL EFFUSION (Left)   Subjective:  Ms. Huard continues to complain of pain at chest tube site.  She has some nausea as well.  She has moved her bowels.  Objective: Vital signs in last 24 hours: Temp:  [97.8 F (36.6 C)-98.7 F (37.1 C)] 98.7 F (37.1 C) (02/06 0524) Pulse Rate:  [105-115] 106 (02/06 0525) Cardiac Rhythm:  [-] Sinus tachycardia (02/06 0525) Resp:  [16-25] 22 (02/06 0525) BP: (101-120)/(59-68) 108/62 mmHg (02/06 0525) SpO2:  [90 %-98 %] 90 % (02/06 0525)  Intake/Output from previous day: 02/05 0701 - 02/06 0700 In: 2027 [P.O.:1797; I.V.:230] Out: 2670 [Urine:2450; Chest Tube:220]  General appearance: alert, cooperative and no distress Heart: regular rate and rhythm Lungs: diminished breath sounds bibasilar Abdomen: soft, non-tender; bowel sounds normal; no masses,  no organomegaly Extremities: extremities normal, atraumatic, no cyanosis or edema Wound: clean and dry  Lab Results:  Recent Labs  02/09/15 0500 02/10/15 0524  WBC 13.1* 11.6*  HGB 8.7* 9.1*  HCT 26.2* 27.8*  PLT 251 266   BMET:  Recent Labs  02/09/15 0500 02/10/15 0524  NA 138 138  K 4.4 4.2  CL 104 103  CO2 23 29  GLUCOSE 136* 90  BUN 9 15  CREATININE 0.57 0.57  CALCIUM 8.6* 8.6*    PT/INR: No results for input(s): LABPROT, INR in the last 72 hours. ABG    Component Value Date/Time   PHART 7.433 02/09/2015 0609   HCO3 24.9* 02/09/2015 0609   TCO2 26.1 02/09/2015 0609   ACIDBASEDEF 2.1* 02/07/2015 2330   O2SAT 95.4 02/09/2015 0609   CBG (last 3)  No results for input(s): GLUCAP in the last 72 hours.  Assessment/Plan: S/P Procedure(s) (LRB): VIDEO ASSISTED THORACOSCOPY, lung biopsy (Left) DRAINAGE OF PLEURAL EFFUSION (Left)  1. Chest tube- no air leak present, 200 cc output  yesterday ( level at 700 on Pleuro vac)- continue Blake drain for now 2. Pulm- wean oxygen as tolerated, CXR with small pneumothorax, continue left mass and effusion 3. GI- nausea Zofran prn, will make stool softners prn per patient request 4. Pathology remains pending, OR cultures negative to date 5. Dispo- patient stable, leave chest tube to water seal, repeat CXR in AM   LOS: 5 days    BARRETT, ERIN 02/11/2015  Patient seen and examined, agree with above She has a pneumothorax but no air leak from CT- will dc CT. If pneumo increases or becomes symptomatic will need a new anterior CT  Remo Lipps C. Roxan Hockey, MD [facility] [phone]

## 2015-02-11 NOTE — Progress Notes (Signed)
Fentanyl PCA discontinued. 280 mcg wasted. Georges Lynch., RN witnessed waste.

## 2015-02-11 NOTE — Progress Notes (Signed)
Chest tube removed per order. No adverse events noted during or after procedure. Radiology notified to do follow up CXR.

## 2015-02-11 NOTE — Progress Notes (Signed)
[facility] PROGRESS NOTE  Alejandra Callahan YQM:[id number] DOB: [birth year] DOA: 02/06/2015 PCP: [name], MD  Assessment/Plan: SYNA GAD is an 70 y.o. 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 Patient was discharged from hospital 1/31 s/p lung biopsy where she was noted to have a pneumothorax. Pneumothorax noted to be stable upon discharge. Per patient, coughing worsens when ambulating and trying to eat. Reports BLE swelling 1/31 as well as swelling of right hand that has resolved prior to admission. She denies any recent swallow studies or diet restriction. Denies CP/ palpitation, back pain, and abd pain. Denies any loss of appetite, N/V/D, and is afebrile. Is on chronic Prednisone PRN for rheumetoid arthritis, last dose was 1/31.  Workup revealed hgb 9.4.  1-Acute hypoxic Respiratory failure , pneumothorax, hydropneumothorax , left lung masses.  Presents with worsening cough and dyspnea.  CVTS consulted. Patient underwent Left assisted thoracoscopy 2-3, drainage of pleural effusion and lung biopsy.  CT negative for PE, hydrothorax.  Nebulizer.  On PCA pump for pain controlled per CVTS. Weaning today  Chest x ray stable.   Pneumothorax with pleural effusion, s/p biopsy. Malignant neoplasm metastatic to lung CA; Dr Roxan Hockey  Following. S/P chest tube and biopsy of lung mass. Management per Dr Roxan Hockey.  chest tube removed  2-04 and continue with blake drain, plan to be removed today  Awaiting pathology   Rheumatoid arthritis, currently taking Prednisone, takes as needed 5 mg   Possible adrenal insufficiency.  Taper IV solumedrol, change to prednisone , need to continue taper dose.   Endometrial CA; follow with oncology Dr Whitney Muse.   DVT prophylasix: Hold heparin,  Per Dr Roxan Hockey note hold anticoagulation for 24  hour. SCD ordered.   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 Anemia panel: B 12 574, Folate 14, iron 25, ferritin 250. Iron deficiency anemia. Started ferrous sulfate  Hb stable.   Code Status: full code.  Family Communication: care discussed with patient and sister  who was at bedside.  Disposition Plan: remain inpatient    Consultants:  Dr Princess Perna   Procedures:  S/P chest tube and biopsy of lung mass. Antibiotics:  none  HPI/Subjective: Chest Pain at chest tube site has improved.    Objective: Filed Vitals:   02/11/15 0525 02/11/15 0756  BP: 108/62 101/66  Pulse: 106 117  Temp:  98.4 F (36.9 C)  Resp: 22 20    Intake/Output Summary (Last 24 hours) at 02/11/15 0846 Last data filed at 02/11/15 0800  Gross per 24 hour  Intake   2267 ml  Output   2880 ml  Net   -613 ml   Filed Weights   02/06/15 1822 02/07/15 0220 02/08/15 1352  Weight: 50.803 kg (112 lb) 52.5 kg (115 lb 11.9 oz) 55.2 kg (121 lb 11.1 oz)    Exam:   General: NAD  Cardiovascular: S 1, s 2 RRR  Respiratory: bilateral air movement, no wheezing , chest; drainage in place  Abdomen: bs present, soft,   Musculoskeletal: no edema  Data Reviewed: Basic Metabolic Panel:  Recent Labs Lab 02/06/15 1918 02/07/15 0256 02/08/15 0640 02/09/15 0500 02/10/15 0524  NA 137  --  143 138 138  K 3.9  --  3.7 4.4 4.2  CL 104  --  106 104 103  CO2 26  --  23  23 29  GLUCOSE 99  --  156* 136* 90  BUN 18  --  '8 9 15  '$ CREATININE 0.58 0.54 0.57 0.57 0.57  CALCIUM 8.6*  --  8.8* 8.6* 8.6*  MG  --   --   --  1.9  --    Liver Function Tests:  Recent Labs Lab 02/10/15 0524  AST 24  ALT 16  ALKPHOS 65  BILITOT 0.3  PROT 4.5*  ALBUMIN 2.1*   No results for input(s): LIPASE, AMYLASE in the last 168 hours. No results for input(s): AMMONIA in the last 168 hours. CBC:  Recent Labs Lab 02/06/15 1918 02/07/15 0256 02/07/15 2100 02/08/15 0640 02/09/15 0500  02/10/15 0524  WBC 7.8 7.2 8.4 11.4* 13.1* 11.6*  NEUTROABS 4.7  --   --   --   --   --   HGB 9.4* 9.4* 9.4* 9.3* 8.7* 9.1*  HCT 28.1* 27.8* 27.9* 27.9* 26.2* 27.8*  MCV 107.7* 106.1* 106.1* 106.5* 107.4* 108.6*  PLT 220 244 268 255 251 266   Cardiac Enzymes:  Recent Labs Lab 02/06/15 1918  TROPONINI <0.03   BNP (last 3 results)  Recent Labs  02/06/15 1918  BNP 61.0    ProBNP (last 3 results) No results for input(s): PROBNP in the last 8760 hours.  CBG: No results for input(s): GLUCAP in the last 168 hours.  Recent Results (from the past 240 hour(s))  Surgical pcr screen     Status: None   Collection Time: 02/08/15  2:04 AM  Result Value Ref Range Status   MRSA, PCR NEGATIVE NEGATIVE Final   Staphylococcus aureus NEGATIVE NEGATIVE Final    Comment:        The Xpert SA Assay (FDA approved for NASAL specimens in patients over 96 years of age), is one component of a comprehensive surveillance program.  Test performance has been validated by [facility] for patients greater than or equal to 10 year old. It is not intended to diagnose infection nor to guide or monitor treatment.   Fungus Culture with Smear     Status: None (Preliminary result)   Collection Time: 02/08/15 11:01 AM  Result Value Ref Range Status   Specimen Description FLUID PLEURAL LEFT  Final   Special Requests NONE  Final   Fungal Smear   Final    NO YEAST OR FUNGAL ELEMENTS SEEN Performed at [facility]    Culture   Final    CULTURE IN PROGRESS FOR FOUR WEEKS Performed at [facility]    Report Status PENDING  Incomplete  AFB culture with smear (NOT at [facility])     Status: None (Preliminary result)   Collection Time: 02/08/15 11:01 AM  Result Value Ref Range Status   Specimen Description FLUID PLEURAL LEFT  Final   Special Requests NONE  Final   Acid Fast Smear   Final    NO ACID FAST BACILLI SEEN Performed at [facility]    Culture   Final    CULTURE WILL BE  EXAMINED FOR 6 WEEKS BEFORE ISSUING A FINAL REPORT Performed at [facility]    Report Status PENDING  Incomplete  Culture, body fluid-bottle     Status: None (Preliminary result)   Collection Time: 02/08/15 11:01 AM  Result Value Ref Range Status   Specimen Description FLUID LEFT PLEURAL  Final   Special Requests NONE  Final   Culture NO GROWTH 2 DAYS  Final   Report Status PENDING  Incomplete  Gram stain     Status: None   Collection Time: 02/08/15 11:01 AM  Result Value Ref Range Status   Specimen Description FLUID LEFT PLEURAL  Final   Special Requests NONE  Final   Gram Stain   Final    MODERATE WBC PRESENT,BOTH PMN AND MONONUCLEAR NO ORGANISMS SEEN    Report Status 02/08/2015 FINAL  Final     Studies: Dg Chest Port 1 View  02/11/2015  CLINICAL DATA:  Chest tube placement for pneumothorax EXAM: PORTABLE CHEST 1 VIEW COMPARISON:  February 10, 2015 chest radiograph and chest CT February 06, 2015 FINDINGS: Chest tube remains on the left. There is a persistent small pneumothorax on the left with apical and basilar components, not changed. No tension component. There remains a sizable left lower lobe mass, somewhat ill-defined by radiography but grossly stable. There is a superimposed left pleural effusion. On the right, there is slightly less atelectasis in the right base. No new opacity on the right. Port-A-Cath tip is at the cavoatrial junction. No pneumothorax is apparent on the right. The heart appears enlarged but stable. The pulmonary vascularity is within normal limits. There are surgical clips in the left axillary region. IMPRESSION: Persistent small pneumothorax on the left without tension component. No change in tube and catheter positions. Left lower lobe mass with effusion remains. There may well be associated pneumonitis in this area on the left. On the right, there is less right base atelectasis. No new opacity on the right. No change in cardiac silhouette. Electronically  Signed   By: Lowella Grip III M.D.   On: 02/11/2015 07:18   Dg Chest Port 1 View  02/10/2015  CLINICAL DATA:  Patient with left-sided chest tube. History of pneumonia. Lung mass. EXAM: PORTABLE CHEST 1 VIEW COMPARISON:  Chest radiograph 02/09/2015 FINDINGS: Central venous catheter tip projects over the superior vena cava, unchanged. Interval removal of 1 of the 2 left chest tubes. New small left pneumothorax. Re- demonstrated large left lung mass. Persistent small left pleural effusion. Unchanged heterogeneous opacities right lung base. IMPRESSION: Interval removal of 1 of the left-sided chest tubes. Single chest tube remains in place. There is a new small left pneumothorax. Unchanged large left lung mass and pleural effusion. Critical Value/emergent results were called by telephone at the time of interpretation on 02/10/2015 at 10:38 am to Dr. Ellwood Handler , who verbally acknowledged these results. Electronically Signed   By: Lovey Newcomer M.D.   On: 02/10/2015 10:39    Scheduled Meds: . acetaminophen  1,000 mg Oral 4 times per day   Or  . acetaminophen (TYLENOL) oral liquid 160 mg/5 mL  1,000 mg Oral 4 times per day  . albuterol  2.5 mg Nebulization BID  . antiseptic oral rinse  7 mL Mouth Rinse BID  . benzonatate  200 mg Oral TID  . calcium-vitamin D  1 tablet Oral BID  . cholecalciferol  1,000 Units Oral QHS  . feeding supplement (ENSURE ENLIVE)  237 mL Oral BID BM  . folic acid  1 mg Oral Daily  . Influenza vac split quadrivalent PF  0.5 mL Intramuscular Once  . iron polysaccharides  150 mg Oral Daily  . lung surgery book   Does not apply Once  . multivitamin with minerals  1 tablet Oral Daily  . [START ON 02/12/2015] predniSONE  10 mg Oral Q breakfast  . raloxifene  60 mg Oral Daily  . scopolamine  1 patch Transdermal Q72H  .  sodium chloride flush  3 mL Intravenous Q12H   Continuous Infusions: . 0.9 % NaCl with KCl 20 mEq / L 10 mL/hr at 02/11/15 0600    Active Problems:    Pneumothorax   SOB (shortness of breath)   Lung mass   Pleural effusion, left   Acute respiratory failure with hypoxia (HCC)   Anemia due to other cause    Time spent: 25 minutes.     Niel Hummer A  [facility] Pager [phone]. If 7PM-7AM, please contact night-coverage at [email or web address], password [email or web address] 02/11/2015, 8:46 AM  LOS: 5 days

## 2015-02-12 ENCOUNTER — Encounter (HOSPITAL_COMMUNITY): Payer: Self-pay | Admitting: Thoracic Surgery (Cardiothoracic Vascular Surgery)

## 2015-02-12 ENCOUNTER — Inpatient Hospital Stay (HOSPITAL_COMMUNITY): Payer: Medicare Other

## 2015-02-12 LAB — BASIC METABOLIC PANEL
ANION GAP: 8 (ref 5–15)
BUN: 10 mg/dL (ref 6–20)
CALCIUM: 9.2 mg/dL (ref 8.9–10.3)
CO2: 31 mmol/L (ref 22–32)
CREATININE: 0.6 mg/dL (ref 0.44–1.00)
Chloride: 91 mmol/L — ABNORMAL LOW (ref 101–111)
GLUCOSE: 99 mg/dL (ref 65–99)
Potassium: 4.2 mmol/L (ref 3.5–5.1)
Sodium: 130 mmol/L — ABNORMAL LOW (ref 135–145)

## 2015-02-12 LAB — CBC
HCT: 27.1 % — ABNORMAL LOW (ref 36.0–46.0)
Hemoglobin: 9 g/dL — ABNORMAL LOW (ref 12.0–15.0)
MCH: 34.6 pg — ABNORMAL HIGH (ref 26.0–34.0)
MCHC: 33.2 g/dL (ref 30.0–36.0)
MCV: 104.2 fL — AB (ref 78.0–100.0)
PLATELETS: 228 10*3/uL (ref 150–400)
RBC: 2.6 MIL/uL — ABNORMAL LOW (ref 3.87–5.11)
RDW: 16 % — AB (ref 11.5–15.5)
WBC: 14.3 10*3/uL — AB (ref 4.0–10.5)

## 2015-02-12 LAB — CORTISOL-AM, BLOOD: Cortisol - AM: 22.6 ug/dL (ref 6.7–22.6)

## 2015-02-12 LAB — LACTIC ACID, PLASMA: Lactic Acid, Venous: 1.9 mmol/L (ref 0.5–2.0)

## 2015-02-12 MED ORDER — SODIUM CHLORIDE 0.9 % IV SOLN
INTRAVENOUS | Status: DC
  Administered 2015-02-12: 10:00:00 via INTRAVENOUS

## 2015-02-12 MED ORDER — SODIUM CHLORIDE 0.9 % IV SOLN
INTRAVENOUS | Status: DC
  Administered 2015-02-12: 12:00:00 via INTRAVENOUS

## 2015-02-12 NOTE — Progress Notes (Signed)
4 Days Post-Op Procedure(s) (LRB): VIDEO ASSISTED THORACOSCOPY, lung biopsy (Left) DRAINAGE OF PLEURAL EFFUSION (Left) Subjective: Feels better today Had a lot of nausea yesterday No shortness of breath  Objective: Vital signs in last 24 hours: Temp:  [97.3 F (36.3 C)-99.4 F (37.4 C)] 98.4 F (36.9 C) (02/07 0520) Pulse Rate:  [104-127] 115 (02/07 0520) Cardiac Rhythm:  [-] Sinus tachycardia (02/07 0520) Resp:  [17-31] 17 (02/07 0520) BP: (88-112)/(51-59) 107/58 mmHg (02/07 0520) SpO2:  [92 %-99 %] 97 % (02/07 0520)  Hemodynamic parameters for last 24 hours:    Intake/Output from previous day: 02/06 0701 - 02/07 0700 In: 873 [P.O.:840; I.V.:33] Out: 1955 [Urine:1875; Chest Tube:80] Intake/Output this shift:    General appearance: alert, cooperative and no distress Neurologic: intact Heart: tachy regular Lungs: diminished breath sounds base - left Abdomen: normal findings: soft, non-tender Wound: clean and dry  Lab Results:  Recent Labs  02/10/15 0524 02/12/15 0545  WBC 11.6* 14.3*  HGB 9.1* 9.0*  HCT 27.8* 27.1*  PLT 266 228   BMET:  Recent Labs  02/10/15 0524 02/12/15 0545  NA 138 130*  K 4.2 4.2  CL 103 91*  CO2 29 31  GLUCOSE 90 99  BUN 15 10  CREATININE 0.57 0.60  CALCIUM 8.6* 9.2    PT/INR: No results for input(s): LABPROT, INR in the last 72 hours. ABG    Component Value Date/Time   PHART 7.433 02/09/2015 0609   HCO3 24.9* 02/09/2015 0609   TCO2 26.1 02/09/2015 0609   ACIDBASEDEF 2.1* 02/07/2015 2330   O2SAT 95.4 02/09/2015 0609   CBG (last 3)  No results for input(s): GLUCAP in the last 72 hours.  Assessment/Plan: S/P Procedure(s) (LRB): VIDEO ASSISTED THORACOSCOPY, lung biopsy (Left) DRAINAGE OF PLEURAL EFFUSION (Left) -  Looks better overall today CV- still tachycardic and relatively low BP- will give 500 ml NS over 2 hours  Continue to monitor  RESP- CXR shows a small loculated hydropneumothorax, but acceptable postop  appearance  RENAL- hyponatremia, mild  ENDO- CBG OK  Concern for possible adrenal insufficiency, check cortisol, continue prednisone  Increase ambulation   LOS: 6 days    Alejandra Callahan 02/12/2015

## 2015-02-12 NOTE — Progress Notes (Signed)
Pt oxygen saturations 83% while sleeping placed on 2 liters pt's has developed a dry cough denies sob, pt saturations 97 on 2 liters will continue to monitor

## 2015-02-12 NOTE — Care Management Note (Signed)
Case Management Note  Patient Details  Name: TYEESHA RIKER MRN: [id number] Date of Birth: Mar 16, 1945  Subjective/Objective:  02/12/15 S/p vats , pl effusion,  Chest tube dc'd, cxr small ptx, tachy, low bp , 500cc bolus given.  Hopeful for dc tomorrow, NCM will cont to follow for dc needs.                    Action/Plan: possible adrenal insufficiency, check cortisol, continue prednisone,cxr small ptx Increase ambulation   Expected Discharge Date:                  Expected Discharge Plan:  Home/Self Care  In-House Referral:     Discharge planning Services  CM Consult  Post Acute Care Choice:    Choice offered to:     DME Arranged:    DME Agency:     HH Arranged:    HH Agency:     Status of Service:  In process, will continue to follow  Medicare Important Message Given:    Date Medicare IM Given:    Medicare IM give by:    Date Additional Medicare IM Given:    Additional Medicare Important Message give by:     If discussed at [place] of Stay Meetings, dates discussed:    Additional Comments:  Zenon Mayo, RN 02/12/2015, 3:02 PM

## 2015-02-12 NOTE — Progress Notes (Signed)
[facility] PROGRESS NOTE  MELLODY MASRI Alejandra DOB: 12-16-1945 DOA: 02/06/2015 PCP: Wende Callahan, Alejandra Callahan is an 70 y.o. 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 Patient was discharged from hospital 1/31 s/p lung biopsy where she was noted to have a pneumothorax. Pneumothorax noted to be stable upon discharge. Per patient, coughing worsens when ambulating and trying to eat. Reports BLE swelling 1/31 as well as swelling of right hand that has resolved prior to admission. She denies any recent swallow studies or diet restriction. Denies CP/ palpitation, back pain, and abd pain. Denies any loss of appetite, N/V/D, and is afebrile. Is on chronic Prednisone PRN for rheumetoid arthritis, last dose was 1/31.  Workup revealed hgb 9.4.  1-Acute hypoxic Respiratory failure , pneumothorax, hydropneumothorax , left lung masses. pleural effusion,  Presents with worsening cough and dyspnea.  CVTS consulted. Patient underwent Left assisted thoracoscopy 2-3, drainage of pleural effusion and lung biopsy.  CT negative for PE, hydrothorax.  Nebulizer.  Chest x ray stable.  chest tube removed  2-04 and continue with blake drain, plan to be removed 2-06. Dr Roxan Hockey  Following. S/P chest tube and biopsy of lung mass. Management per Dr Roxan Hockey.  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 Dr Roxan Hockey will discuss results with patient.   Hypotension;  Received IV bolus, hb stable.  WBC mildly elevated.  Discussed with Dr Roxan Hockey will hold on IV antibiotics for now, no real evidence of PNA.  Cortisol 23.  I will order lactic acid.  Will start IV fluids.   Rheumatoid arthritis, currently taking Prednisone, takes as needed 5 mg   Possible adrenal  insufficiency.  Taper IV solumedrol, change to prednisone , need to continue taper dose.   Endometrial CA; follow with oncology Dr Whitney Muse.   DVT prophylasix: Hold heparin,  Per Dr Roxan Hockey note hold anticoagulation for 24 hour. SCD ordered.   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 Anemia panel: B 12 574, Folate 14, iron 25, ferritin 250. Iron deficiency anemia. Started ferrous sulfate  Hb stable.   Code Status: full code.  Family Communication: care discussed with patient and sister  who was at bedside.  Disposition Plan: remain inpatient    Consultants:  Dr Princess Perna   Procedures:  S/P chest tube and biopsy of lung mass.  Antibiotics:  none  HPI/Subjective: She is feeling well,  Denies worsening dyspnea.  No worsening cough.     Objective: Filed Vitals:   02/12/15 0800 02/12/15 1400  BP: 86/55   Pulse: 109   Temp: 98.7 F (37.1 C) 97.5 F (36.4 C)  Resp: 23     Intake/Output Summary (Last 24 hours) at 02/12/15 1419 Last data filed at 02/12/15 1405  Gross per 24 hour  Intake    360 ml  Output   2150 ml  Net  -1790 ml   Filed Weights   02/06/15 1822 02/07/15 0220 02/08/15 1352  Weight: 50.803 kg (112 lb) 52.5 kg (115 lb 11.9 oz) 55.2 kg (121 lb 11.1 oz)    Exam:   General: NAD  Cardiovascular: S 1, s 2 RRR  Respiratory: bilateral air movement, no wheezing  Abdomen: bs present, soft,   Musculoskeletal: no edema  Data Reviewed: Basic Metabolic Panel:  Recent Labs Lab 02/06/15 1918 02/07/15 0256  02/08/15 0640 02/09/15 0500 02/10/15 0524 02/12/15 0545  NA 137  --  143 138 138 130*  K 3.9  --  3.7 4.4 4.2 4.2  CL 104  --  106 104 103 91*  CO2 26  --  '23 23 29 31  '$ GLUCOSE 99  --  156* 136* 90 99  BUN 18  --  '8 9 15 10  '$ CREATININE 0.58 0.54 0.57 0.57 0.57 0.60  CALCIUM 8.6*  --  8.8* 8.6* 8.6* 9.2  MG  --   --   --  1.9  --   --    Liver Function Tests:  Recent Labs Lab 02/10/15 0524  AST 24   ALT 16  ALKPHOS 65  BILITOT 0.3  PROT 4.5*  ALBUMIN 2.1*   No results for input(s): LIPASE, AMYLASE in the last 168 hours. No results for input(s): AMMONIA in the last 168 hours. CBC:  Recent Labs Lab 02/06/15 1918  02/07/15 2100 02/08/15 0640 02/09/15 0500 02/10/15 0524 02/12/15 0545  WBC 7.8  < > 8.4 11.4* 13.1* 11.6* 14.3*  NEUTROABS 4.7  --   --   --   --   --   --   HGB 9.4*  < > 9.4* 9.3* 8.7* 9.1* 9.0*  HCT 28.1*  < > 27.9* 27.9* 26.2* 27.8* 27.1*  MCV 107.7*  < > 106.1* 106.5* 107.4* 108.6* 104.2*  PLT 220  < > 268 255 251 266 228  < > = values in this interval not displayed. Cardiac Enzymes:  Recent Labs Lab 02/06/15 1918  TROPONINI <0.03   BNP (last 3 results)  Recent Labs  02/06/15 1918  BNP 61.0    ProBNP (last 3 results) No results for input(s): PROBNP in the last 8760 hours.  CBG: No results for input(s): GLUCAP in the last 168 hours.  Recent Results (from the past 240 hour(s))  Surgical pcr screen     Status: None   Collection Time: 02/08/15  2:04 AM  Result Value Ref Range Status   MRSA, PCR NEGATIVE NEGATIVE Final   Staphylococcus aureus NEGATIVE NEGATIVE Final    Comment:        The Xpert SA Assay (FDA approved for NASAL specimens in patients over 59 years of age), is one component of a comprehensive surveillance program.  Test performance has been validated by [facility] for patients greater than or equal to 81 year old. It is not intended to diagnose infection nor to guide or monitor treatment.   Fungus Culture with Smear     Status: None (Preliminary result)   Collection Time: 02/08/15 11:01 AM  Result Value Ref Range Status   Specimen Description FLUID PLEURAL LEFT  Final   Special Requests NONE  Final   Fungal Smear   Final    NO YEAST OR FUNGAL ELEMENTS SEEN Performed at [facility]    Culture   Final    CULTURE IN PROGRESS FOR FOUR WEEKS Performed at [facility]    Report Status PENDING   Incomplete  AFB culture with smear (NOT at [facility])     Status: None (Preliminary result)   Collection Time: 02/08/15 11:01 AM  Result Value Ref Range Status   Specimen Description FLUID PLEURAL LEFT  Final   Special Requests NONE  Final   Acid Fast Smear   Final    NO ACID FAST BACILLI SEEN Performed at [facility]    Culture   Final    CULTURE  WILL BE EXAMINED FOR 6 WEEKS BEFORE ISSUING A FINAL REPORT Performed at [facility]    Report Status PENDING  Incomplete  Culture, body fluid-bottle     Status: None (Preliminary result)   Collection Time: 02/08/15 11:01 AM  Result Value Ref Range Status   Specimen Description FLUID LEFT PLEURAL  Final   Special Requests NONE  Final   Culture NO GROWTH 4 DAYS  Final   Report Status PENDING  Incomplete  Gram stain     Status: None   Collection Time: 02/08/15 11:01 AM  Result Value Ref Range Status   Specimen Description FLUID LEFT PLEURAL  Final   Special Requests NONE  Final   Gram Stain   Final    MODERATE WBC PRESENT,BOTH PMN AND MONONUCLEAR NO ORGANISMS SEEN    Report Status 02/08/2015 FINAL  Final     Studies: Dg Chest 2 View  02/12/2015  CLINICAL DATA:  Lung tumor.  Chemotherapy. EXAM: CHEST  2 VIEW COMPARISON:  02/11/2015.  CT 02/06/2015. FINDINGS: Port-A-Cath in stable position. Large left lung mass unchanged. Right lower lobe pulmonary nodules best demonstrated by CT. Left pleural effusion. Tiny residual left sided pneumothorax, improved from prior exam. Left mastectomy. Surgical clips left axilla. No acute bony abnormality. IMPRESSION: 1. Port-A-Cath in stable position. 2. Tiny residual left pneumothorax, improved from prior exam. 3. Persistent large left lung mass, unchanged. Persistent left pleural effusion, unchanged. Pulmonary nodules previously identified in the right lung base are best demonstrated by prior CT. Electronically Signed   By: Marcello Moores  Register   On: 02/12/2015 08:04   Dg Chest Port 1  View  02/11/2015  CLINICAL DATA:  Status post left chest tube removal. Short of breath and cough. EXAM: PORTABLE CHEST 1 VIEW COMPARISON:  02/11/2015 at 6:12 a.m., and older exams. FINDINGS: Mass superimposed on the cardiac silhouette is stable. Left chest tube has been removed. Small left apical pneumothorax is decreased in size when compared the prior study. No new lung opacities. Right anterior chest wall Port-A-Cath is stable. IMPRESSION: 1. Small left pneumothorax, decreased in size from the earlier study despite removal of the left chest tube. 2. No new abnormalities. Electronically Signed   By: Lajean Manes M.D.   On: 02/11/2015 12:05   Dg Chest Port 1 View  02/11/2015  CLINICAL DATA:  Chest tube placement for pneumothorax EXAM: PORTABLE CHEST 1 VIEW COMPARISON:  February 10, 2015 chest radiograph and chest CT February 06, 2015 FINDINGS: Chest tube remains on the left. There is a persistent small pneumothorax on the left with apical and basilar components, not changed. No tension component. There remains a sizable left lower lobe mass, somewhat ill-defined by radiography but grossly stable. There is a superimposed left pleural effusion. On the right, there is slightly less atelectasis in the right base. No new opacity on the right. Port-A-Cath tip is at the cavoatrial junction. No pneumothorax is apparent on the right. The heart appears enlarged but stable. The pulmonary vascularity is within normal limits. There are surgical clips in the left axillary region. IMPRESSION: Persistent small pneumothorax on the left without tension component. No change in tube and catheter positions. Left lower lobe mass with effusion remains. There may well be associated pneumonitis in this area on the left. On the right, there is less right base atelectasis. No new opacity on the right. No change in cardiac silhouette. Electronically Signed   By: Lowella Grip III M.D.   On: 02/11/2015 07:18  Scheduled Meds: .  acetaminophen  1,000 mg Oral 4 times per day   Or  . acetaminophen (TYLENOL) oral liquid 160 mg/5 mL  1,000 mg Oral 4 times per day  . antiseptic oral rinse  7 mL Mouth Rinse BID  . benzonatate  200 mg Oral TID  . calcium-vitamin D  1 tablet Oral BID  . cholecalciferol  1,000 Units Oral QHS  . feeding supplement (ENSURE ENLIVE)  237 mL Oral BID BM  . folic acid  1 mg Oral Daily  . Influenza vac split quadrivalent PF  0.5 mL Intramuscular Once  . iron polysaccharides  150 mg Oral Daily  . lung surgery book   Does not apply Once  . multivitamin with minerals  1 tablet Oral Daily  . predniSONE  10 mg Oral Q breakfast  . raloxifene  60 mg Oral Daily  . scopolamine  1 patch Transdermal Q72H  . sodium chloride flush  3 mL Intravenous Q12H   Continuous Infusions: . sodium chloride    . 0.9 % NaCl with KCl 20 mEq / L Stopped (02/11/15 1000)    Active Problems:   Pneumothorax   SOB (shortness of breath)   Lung mass   Pleural effusion, left   Acute respiratory failure with hypoxia (HCC)   Anemia due to other cause    Time spent: 25 minutes.     Niel Hummer A  [facility] Pager [phone]. If 7PM-7AM, please contact night-coverage at [email or web address], password [email or web address] 02/12/2015, 2:19 PM  LOS: 6 days

## 2015-02-13 ENCOUNTER — Inpatient Hospital Stay (HOSPITAL_COMMUNITY): Payer: Medicare Other

## 2015-02-13 DIAGNOSIS — D6489 Other specified anemias: Secondary | ICD-10-CM

## 2015-02-13 DIAGNOSIS — J9601 Acute respiratory failure with hypoxia: Secondary | ICD-10-CM

## 2015-02-13 DIAGNOSIS — J948 Other specified pleural conditions: Secondary | ICD-10-CM

## 2015-02-13 DIAGNOSIS — R918 Other nonspecific abnormal finding of lung field: Secondary | ICD-10-CM

## 2015-02-13 DIAGNOSIS — R0602 Shortness of breath: Secondary | ICD-10-CM

## 2015-02-13 DIAGNOSIS — R0600 Dyspnea, unspecified: Secondary | ICD-10-CM

## 2015-02-13 LAB — CULTURE, BODY FLUID W GRAM STAIN -BOTTLE: Culture: NO GROWTH

## 2015-02-13 LAB — OSMOLALITY: OSMOLALITY: 273 mosm/kg — AB (ref 275–295)

## 2015-02-13 LAB — CREATININE, URINE, RANDOM: CREATININE, URINE: 18.08 mg/dL

## 2015-02-13 LAB — TSH: TSH: 2.531 u[IU]/mL (ref 0.350–4.500)

## 2015-02-13 LAB — URIC ACID: URIC ACID, SERUM: 2.8 mg/dL (ref 2.3–6.6)

## 2015-02-13 LAB — OSMOLALITY, URINE: OSMOLALITY UR: 253 mosm/kg — AB (ref 300–900)

## 2015-02-13 LAB — SODIUM, URINE, RANDOM: SODIUM UR: 57 mmol/L

## 2015-02-13 MED ORDER — HYDROCORTISONE NA SUCCINATE PF 100 MG IJ SOLR
100.0000 mg | Freq: Three times a day (TID) | INTRAMUSCULAR | Status: DC
  Administered 2015-02-13 – 2015-02-14 (×4): 100 mg via INTRAVENOUS
  Filled 2015-02-13 (×5): qty 2

## 2015-02-13 MED ORDER — ENOXAPARIN SODIUM 40 MG/0.4ML ~~LOC~~ SOLN
40.0000 mg | SUBCUTANEOUS | Status: DC
  Filled 2015-02-13: qty 0.4

## 2015-02-13 MED ORDER — HYDROCORTISONE SOD SUCCINATE 100 MG PF FOR IT USE: 100.0000 mg | Freq: Three times a day (TID) | INTRAMUSCULAR | Status: DC

## 2015-02-13 MED ORDER — ACETAMINOPHEN 500 MG PO TABS: 1000.0000 mg | ORAL_TABLET | Freq: Four times a day (QID) | ORAL | Status: DC

## 2015-02-13 MED ORDER — SODIUM CHLORIDE 0.9 % IV BOLUS (SEPSIS)
1000.0000 mL | Freq: Once | INTRAVENOUS | Status: AC
  Administered 2015-02-13: 1000 mL via INTRAVENOUS

## 2015-02-13 MED ORDER — ACETAMINOPHEN 160 MG/5ML PO SOLN: 1000.0000 mg | Freq: Four times a day (QID) | ORAL | Status: DC

## 2015-02-13 NOTE — Progress Notes (Addendum)
[facility] PROGRESS NOTE  TWILA RAPPA GYI:[id number] DOB: [birth year] DOA: 02/06/2015 PCP: [name], MD  Assessment/Plan:  Alejandra Callahan is an 70 y.o. 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 Patient was discharged from hospital 1/31 s/p lung biopsy where she was noted to have a pneumothorax. Pneumothorax noted to be stable upon discharge. Per patient, coughing worsens when ambulating and trying to eat. Reports BLE swelling 1/31 as well as swelling of right hand that has resolved prior to admission. She denies any recent swallow studies or diet restriction. Denies CP/ palpitation, back pain, and abd pain. Denies any loss of appetite, N/V/D, and is afebrile. Is on chronic Prednisone PRN for rheumetoid arthritis, last dose was 1/31. Workup revealed hgb 9.4.    1-Acute hypoxic Respiratory failure , pneumothorax, hydropneumothorax , left lung masses. pleural effusion - Presents with worsening cough and dyspnea. CT chest was negative for PE but showed hydrothorax and lung nodule, cardiothoracic surgery was consulted. Initially underwent bronc directed lung biopsy by [facility] thoracic surgery which was complicated by a pneumothorax, she then underwent Patient underwent VATS procedure. Initially required chest tube is meant which was removed on 02/11/2015.   Hypotension; IVF bolus + IVF - supportive care.  Rheumatoid arthritis, she is chronically on prednisone  Possible adrenal insufficiency. Chronically on prednisone, currently hypotensive therefore will place her on hydrocortisone and monitor.   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 Discussed with her on 02/13/2015, patient's prognosis is extremely poor, she is resistant to the first-line treatment. Options are second line  chemotherapy if she is strong enough to tolerate it at any rate that will be palliative as well. Dr. Whitney Muse recommends involving palliative care which we will do.  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 Anemia panel: B 12 574, Folate 14, iron 25, ferritin 250. Iron deficiency anemia. Started ferrous sulfate  Hb stable.   Sinus tachycardia. Likely due to #1 above. We will check TSH and echogram, hydrate and monitor. PE was ruled out upon admission. We'll check lower extremity venous duplex as well.    DVT prophylasix: Hold heparin,  Per Dr Roxan Hockey note hold anticoagulation for 24 hour. SCD ordered.    Code Status: full code.  Family Communication: care discussed with patient and sister who was at bedside on 02-13-15 Disposition Plan: remain inpatient    Consultants:  Dr Princess Perna , Dr Whitney Muse Oncology over the phone 02-13-15, Pall [facility]  Procedures:  S/P chest tube and biopsy of lung mass.  Antibiotics:  none  HPI/Subjective:  Patient in bed, denies any fever or chills, no headache, denies any chest pain or palpitations. Is short of breath but gradually improving. No focal weakness.   Objective: Filed Vitals:   02/13/15 0318 02/13/15 0740  BP: 114/57 94/57  Pulse: 127 103  Temp:  98.7 F (37.1 C)  Resp: 28 19    Intake/Output Summary (Last 24 hours) at 02/13/15 1057 Last data filed at 02/13/15 0700  Gross per 24 hour  Intake    650 ml  Output   2225 ml  Net  -1575 ml   Filed Weights   02/06/15 1822 02/07/15 0220 02/08/15 1352  Weight: 50.803 kg (112 lb) 52.5 kg (115 lb 11.9 oz) 55.2 kg (121 lb 11.1 oz)    Exam:  General: NAD  Cardiovascular: S 1, s 2 RRR  Respiratory: bilateral air movement, no wheezing  Abdomen: bs present, soft,   Musculoskeletal: no edema  Data Reviewed: Basic Metabolic Panel:  Recent Labs Lab 02/06/15 1918 02/07/15 0256 02/08/15 0640 02/09/15 0500 02/10/15 0524 02/12/15 0545  NA 137   --  143 138 138 130*  K 3.9  --  3.7 4.4 4.2 4.2  CL 104  --  106 104 103 91*  CO2 26  --  '23 23 29 31  '$ GLUCOSE 99  --  156* 136* 90 99  BUN 18  --  '8 9 15 10  '$ CREATININE 0.58 0.54 0.57 0.57 0.57 0.60  CALCIUM 8.6*  --  8.8* 8.6* 8.6* 9.2  MG  --   --   --  1.9  --   --    Liver Function Tests:  Recent Labs Lab 02/10/15 0524  AST 24  ALT 16  ALKPHOS 65  BILITOT 0.3  PROT 4.5*  ALBUMIN 2.1*   No results for input(s): LIPASE, AMYLASE in the last 168 hours. No results for input(s): AMMONIA in the last 168 hours. CBC:  Recent Labs Lab 02/06/15 1918  02/07/15 2100 02/08/15 0640 02/09/15 0500 02/10/15 0524 02/12/15 0545  WBC 7.8  < > 8.4 11.4* 13.1* 11.6* 14.3*  NEUTROABS 4.7  --   --   --   --   --   --   HGB 9.4*  < > 9.4* 9.3* 8.7* 9.1* 9.0*  HCT 28.1*  < > 27.9* 27.9* 26.2* 27.8* 27.1*  MCV 107.7*  < > 106.1* 106.5* 107.4* 108.6* 104.2*  PLT 220  < > 268 255 251 266 228  < > = values in this interval not displayed. Cardiac Enzymes:  Recent Labs Lab 02/06/15 1918  TROPONINI <0.03   BNP (last 3 results)  Recent Labs  02/06/15 1918  BNP 61.0    ProBNP (last 3 results) No results for input(s): PROBNP in the last 8760 hours.  CBG: No results for input(s): GLUCAP in the last 168 hours.  Recent Results (from the past 240 hour(s))  Surgical pcr screen     Status: None   Collection Time: 02/08/15  2:04 AM  Result Value Ref Range Status   MRSA, PCR NEGATIVE NEGATIVE Final   Staphylococcus aureus NEGATIVE NEGATIVE Final    Comment:        The Xpert SA Assay (FDA approved for NASAL specimens in patients over 2 years of age), is one component of a comprehensive surveillance program.  Test performance has been validated by [facility] for patients greater than or equal to 68 year old. It is not intended to diagnose infection nor to guide or monitor treatment.   Fungus Culture with Smear     Status: None (Preliminary result)   Collection Time:  02/08/15 11:01 AM  Result Value Ref Range Status   Specimen Description FLUID PLEURAL LEFT  Final   Special Requests NONE  Final   Fungal Smear   Final    NO YEAST OR FUNGAL ELEMENTS SEEN Performed at [facility]    Culture   Final    CULTURE IN PROGRESS FOR FOUR WEEKS Performed at [facility]    Report Status PENDING  Incomplete  AFB culture with smear (NOT at [facility])     Status: None (Preliminary result)   Collection Time: 02/08/15 11:01 AM  Result Value Ref Range Status   Specimen Description FLUID PLEURAL LEFT  Final   Special Requests NONE  Final   Acid Fast Smear   Final    NO ACID FAST BACILLI SEEN Performed at [facility]    Culture   Final    CULTURE WILL BE EXAMINED FOR 6 WEEKS BEFORE ISSUING A FINAL REPORT Performed at [facility]    Report Status PENDING  Incomplete  Culture, body fluid-bottle     Status: None (Preliminary result)   Collection Time: 02/08/15 11:01 AM  Result Value Ref Range Status   Specimen Description FLUID LEFT PLEURAL  Final   Special Requests NONE  Final   Culture NO GROWTH 4 DAYS  Final   Report Status PENDING  Incomplete  Gram stain     Status: None   Collection Time: 02/08/15 11:01 AM  Result Value Ref Range Status   Specimen Description FLUID LEFT PLEURAL  Final   Special Requests NONE  Final   Gram Stain   Final    MODERATE WBC PRESENT,BOTH PMN AND MONONUCLEAR NO ORGANISMS SEEN    Report Status 02/08/2015 FINAL  Final     Studies: Dg Chest 2 View  02/13/2015  CLINICAL DATA:  Pleural effusion.  Shortness of breath and cough. EXAM: CHEST  2 VIEW COMPARISON:  02/12/2015 FINDINGS: Right jugular Port-A-Cath remains in place with tip overlying the lower SVC. Masses are again seen along the left heart and mediastinal border. Small left pneumothorax is unchanged. There is a small left pleural effusion. Mild right basilar atelectasis is unchanged. IMPRESSION: 1. Unchanged, small left pneumothorax. 2. Small  left pleural effusion. 3. Similar appearance of masses along the left heart border. Electronically Signed   By: Logan Bores M.D.   On: 02/13/2015 08:01   Dg Chest 2 View  02/12/2015  CLINICAL DATA:  Lung tumor.  Chemotherapy. EXAM: CHEST  2 VIEW COMPARISON:  02/11/2015.  CT 02/06/2015. FINDINGS: Port-A-Cath in stable position. Large left lung mass unchanged. Right lower lobe pulmonary nodules best demonstrated by CT. Left pleural effusion. Tiny residual left sided pneumothorax, improved from prior exam. Left mastectomy. Surgical clips left axilla. No acute bony abnormality. IMPRESSION: 1. Port-A-Cath in stable position. 2. Tiny residual left pneumothorax, improved from prior exam. 3. Persistent large left lung mass, unchanged. Persistent left pleural effusion, unchanged. Pulmonary nodules previously identified in the right lung base are best demonstrated by prior CT. Electronically Signed   By: Marcello Moores  Register   On: 02/12/2015 08:04   Dg Chest Port 1 View  02/11/2015  CLINICAL DATA:  Status post left chest tube removal. Short of breath and cough. EXAM: PORTABLE CHEST 1 VIEW COMPARISON:  02/11/2015 at 6:12 a.m., and older exams. FINDINGS: Mass superimposed on the cardiac silhouette is stable. Left chest tube has been removed. Small left apical pneumothorax is decreased in size when compared the prior study. No new lung opacities. Right anterior chest wall Port-A-Cath is stable. IMPRESSION: 1. Small left pneumothorax, decreased in size from the earlier study despite removal of the left chest tube. 2. No new abnormalities. Electronically Signed   By: Lajean Manes M.D.   On: 02/11/2015 12:05    Scheduled Meds: . acetaminophen  1,000 mg Oral 4 times per day   Or  . acetaminophen (TYLENOL) oral liquid 160 mg/5 mL  1,000 mg Oral 4 times per day  . antiseptic oral rinse  7 mL Mouth Rinse BID  . benzonatate  200 mg Oral TID  . calcium-vitamin D  1 tablet Oral BID  . cholecalciferol  1,000 Units Oral QHS  .  feeding supplement (ENSURE ENLIVE)  237 mL Oral BID BM  . folic acid  1 mg Oral Daily  . Influenza vac split quadrivalent PF  0.5 mL Intramuscular Once  . iron polysaccharides  150 mg Oral Daily  . lung surgery book   Does not apply Once  . multivitamin with minerals  1 tablet Oral Daily  . predniSONE  10 mg Oral Q breakfast  . raloxifene  60 mg Oral Daily  . scopolamine  1 patch Transdermal Q72H   Continuous Infusions:    Active Problems:   Pneumothorax   SOB (shortness of breath)   Lung mass   Pleural effusion, left   Acute respiratory failure with hypoxia (HCC)   Anemia due to other cause    Time spent: 25 minutes.    Signature  Thurnell Lose M.D on 02/13/2015 at 10:58 AM  Between 7am to 7pm - Pager - [phone], After 7pm go to [email or web address] - password [email or web address]  [facility]  - Office  [phone]

## 2015-02-13 NOTE — Progress Notes (Signed)
Echocardiogram 2D Echocardiogram has been performed.  02/13/2015 12:19 PM Maudry Mayhew, RVT, RDCS, RDMS

## 2015-02-13 NOTE — Progress Notes (Addendum)
      [address]       [place] [address]             [phone]      5 Days Post-Op Procedure(s) (LRB): VIDEO ASSISTED THORACOSCOPY, lung biopsy (Left) DRAINAGE OF PLEURAL EFFUSION (Left)   Subjective:  Doing okay.  Denies chest pain, does have some shortness of breath.  She states she has not moved her bowels in a few days  Objective: Vital signs in last 24 hours: Temp:  [97.5 F (36.4 C)-99.1 F (37.3 C)] 98.7 F (37.1 C) (02/08 0740) Pulse Rate:  [107-127] 127 (02/08 0318) Cardiac Rhythm:  [-] Sinus tachycardia (02/07 2036) Resp:  [21-28] 28 (02/08 0318) BP: (86-114)/(53-66) 114/57 mmHg (02/08 0318) SpO2:  [91 %-100 %] 100 % (02/08 0318)  Intake/Output from previous day: 02/07 0701 - 02/08 0700 In: 840 [P.O.:240; I.V.:600] Out: 2225 [Urine:2225]  General appearance: alert, cooperative and no distress Heart: regular rate and rhythm and tachy Lungs: diminished breath sounds LLL Abdomen: soft, non-tender; bowel sounds normal; no masses,  no organomegaly Wound: clean and dry  Lab Results:  Recent Labs  02/12/15 0545  WBC 14.3*  HGB 9.0*  HCT 27.1*  PLT 228   BMET:  Recent Labs  02/12/15 0545  NA 130*  K 4.2  CL 91*  CO2 31  GLUCOSE 99  BUN 10  CREATININE 0.60  CALCIUM 9.2    PT/INR: No results for input(s): LABPROT, INR in the last 72 hours. ABG    Component Value Date/Time   PHART 7.433 02/09/2015 0609   HCO3 24.9* 02/09/2015 0609   TCO2 26.1 02/09/2015 0609   ACIDBASEDEF 2.1* 02/07/2015 2330   O2SAT 95.4 02/09/2015 0609   CBG (last 3)  No results for input(s): GLUCAP in the last 72 hours.  Assessment/Plan: S/P Procedure(s) (LRB): VIDEO ASSISTED THORACOSCOPY, lung biopsy (Left) DRAINAGE OF PLEURAL EFFUSION (Left)  1. CV- Sinus Tach, BP remains labile runnings 80-110s- per patient supposed to have Echo today to assess for possible pericardial effusion 2. Pulm- CXR remains stable in appearance, wean oxygen as  tolerated 3. Pathology- reports endometrial cancer, Dr. Roxan Hockey has spoken with patient  4. Dispo- ECHO today, care per primary   LOS: 7 days    Alejandra Callahan, Alejandra Callahan 02/13/2015  Patient seen and examined, agree with above i informed her of the path results- metastatic endometrial cancer Tachycardia persists with no definite explanation- will check echo to r/o pericardial effusion in the setting of metastatic cancer She had a CT on admission that showed no PE Cortisol level was [employer] C. Roxan Hockey, MD [facility] [phone]    Echo Study Conclusions  - Left ventricle: The cavity size was normal. Wall thickness was normal. Systolic function was vigorous. The estimated ejection fraction was in the range of 65% to 70%. - Left atrium: The atrium was mildly dilated. - Pericardium, extracardiac: There was no pericardial effusion. There was a left pleural effusion. There is a large extra cardiac mass along left ventricular free wall.  No pericardial effusion.  To clarify I think she should be on prophylactic doses of blood thinner, but would not fully anticoagulate unless there is evidence of DVT on duplex. Enoxaparin ordered. Continue SCDs  Revonda Standard. Roxan Hockey, MD [facility] [phone]

## 2015-02-13 NOTE — Progress Notes (Signed)
*  Preliminary Results* Bilateral lower extremity venous duplex completed. Bilateral lower extremities are negative for deep vein thrombosis. There is no evidence of Baker's cyst bilaterally.  02/13/2015  Maudry Mayhew, RVT, RDCS, RDMS

## 2015-02-14 ENCOUNTER — Inpatient Hospital Stay (HOSPITAL_BASED_OUTPATIENT_CLINIC_OR_DEPARTMENT_OTHER): Payer: Medicare Other | Admitting: Hematology & Oncology

## 2015-02-14 DIAGNOSIS — C541 Malignant neoplasm of endometrium: Secondary | ICD-10-CM

## 2015-02-14 DIAGNOSIS — C7802 Secondary malignant neoplasm of left lung: Secondary | ICD-10-CM | POA: Diagnosis not present

## 2015-02-14 DIAGNOSIS — Z853 Personal history of malignant neoplasm of breast: Secondary | ICD-10-CM

## 2015-02-14 DIAGNOSIS — R0600 Dyspnea, unspecified: Secondary | ICD-10-CM | POA: Insufficient documentation

## 2015-02-14 DIAGNOSIS — J948 Other specified pleural conditions: Secondary | ICD-10-CM | POA: Diagnosis not present

## 2015-02-14 DIAGNOSIS — Z515 Encounter for palliative care: Secondary | ICD-10-CM | POA: Insufficient documentation

## 2015-02-14 DIAGNOSIS — Z7189 Other specified counseling: Secondary | ICD-10-CM | POA: Insufficient documentation

## 2015-02-14 LAB — BASIC METABOLIC PANEL
ANION GAP: 7 (ref 5–15)
BUN: 5 mg/dL — ABNORMAL LOW (ref 6–20)
CALCIUM: 8.7 mg/dL — AB (ref 8.9–10.3)
CO2: 30 mmol/L (ref 22–32)
CREATININE: 0.43 mg/dL — AB (ref 0.44–1.00)
Chloride: 98 mmol/L — ABNORMAL LOW (ref 101–111)
GFR calc Af Amer: >60 mL/min (ref >60)
GFR calc non Af Amer: >60 mL/min (ref >60)
GLUCOSE: 130 mg/dL — AB (ref 65–99)
Potassium: 4.2 mmol/L (ref 3.5–5.1)
Sodium: 135 mmol/L (ref 135–145)

## 2015-02-14 LAB — CBC
HEMATOCRIT: 24.8 % — AB (ref 36.0–46.0)
Hemoglobin: 8 g/dL — ABNORMAL LOW (ref 12.0–15.0)
MCH: 33.8 pg (ref 26.0–34.0)
MCHC: 32.3 g/dL (ref 30.0–36.0)
MCV: 104.6 fL — AB (ref 78.0–100.0)
Platelets: 242 10*3/uL (ref 150–400)
RBC: 2.37 MIL/uL — ABNORMAL LOW (ref 3.87–5.11)
RDW: 15.9 % — AB (ref 11.5–15.5)
WBC: 12.4 10*3/uL — ABNORMAL HIGH (ref 4.0–10.5)

## 2015-02-14 LAB — MAGNESIUM: Magnesium: 2 mg/dL (ref 1.7–2.4)

## 2015-02-14 MED ORDER — BISACODYL 5 MG PO TBEC: 10.0000 mg | DELAYED_RELEASE_TABLET | Freq: Every day | ORAL | Status: AC | PRN

## 2015-02-14 MED ORDER — LORAZEPAM 2 MG/ML PO CONC: 1.0000 mg | Freq: Four times a day (QID) | ORAL | Status: AC | PRN

## 2015-02-14 MED ORDER — ALBUTEROL SULFATE (2.5 MG/3ML) 0.083% IN NEBU: 2.5000 mg | INHALATION_SOLUTION | RESPIRATORY_TRACT | Status: AC | PRN

## 2015-02-14 MED ORDER — HEPARIN SOD (PORK) LOCK FLUSH 100 UNIT/ML IV SOLN
500.0000 [IU] | INTRAVENOUS | Status: AC | PRN
  Administered 2015-02-14: 500 [IU]

## 2015-02-14 MED ORDER — LORAZEPAM 2 MG/ML PO CONC: 1.0000 mg | Freq: Four times a day (QID) | ORAL | Status: DC | PRN

## 2015-02-14 MED ORDER — ANASTROZOLE 1 MG PO TABS: 1.0000 mg | ORAL_TABLET | Freq: Every day | ORAL | Status: AC

## 2015-02-14 MED ORDER — MORPHINE SULFATE (CONCENTRATE) 10 MG/0.5ML PO SOLN: 10.0000 mg | ORAL | Status: DC | PRN

## 2015-02-14 MED ORDER — FERROUS SULFATE 325 (65 FE) MG PO TABS: 325.0000 mg | ORAL_TABLET | Freq: Two times a day (BID) | ORAL | Status: AC

## 2015-02-14 MED ORDER — PREDNISONE 5 MG PO TABS: ORAL_TABLET | ORAL | Status: DC

## 2015-02-14 MED ORDER — PREDNISONE 5 MG PO TABS: ORAL_TABLET | ORAL | Status: AC

## 2015-02-14 MED ORDER — MORPHINE SULFATE (CONCENTRATE) 10 MG/0.5ML PO SOLN: 10.0000 mg | ORAL | Status: AC | PRN

## 2015-02-14 MED ORDER — ENSURE ENLIVE PO LIQD: 237.0000 mL | Freq: Two times a day (BID) | ORAL | Status: AC

## 2015-02-14 NOTE — Care Management Important Message (Signed)
Important Message  Patient Details  Name: Alejandra Callahan MRN: [id number] Date of Birth: [birth year]   Medicare Important Message Given:  Yes    Loann Quill 02/14/2015, 8:28 AM

## 2015-02-14 NOTE — Care Management Note (Signed)
Case Management Note  Patient Details  Name: SUKHMAN KOCHER MRN: [id number] Date of Birth: [birth year]  Subjective/Objective:   Patient chose [facility], referral made, she will need home oxygen, bedside table and neb machine.  [facility] for DME.  Patient states that she has an oxygen tank at home that her husband has that he has not used that will last for 5 hours, she will have her family members to bring that to hospital so that she can go home and not wait for it to be delivered by [employer].  Family members have left to go get the oxygen tanks, patient is on 4 liters now.  Patient will be transported by car.                   Action/Plan:   Expected Discharge Date:                  Expected Discharge Plan:  Home w Hospice Care  In-House Referral:     Discharge planning Services  CM Consult  Post Acute Care Choice:  Durable Medical Equipment, Hospice Choice offered to:  Patient  DME Arranged:  Oxygen, Nebulizer machine, Overbed table DME Agency:  [place] Arranged:  RN [facility] Agency:  [facility]  Status of Service:  Completed, signed off  Medicare Important Message Given:  Yes Date Medicare IM Given:    Medicare IM give by:    Date Additional Medicare IM Given:    Additional Medicare Important Message give by:     If discussed at [place] of Stay Meetings, dates discussed:    Additional Comments:  Zenon Mayo, RN 02/14/2015, 1:28 PM

## 2015-02-14 NOTE — Progress Notes (Signed)
      [address]       [place] [address]             [phone]       6 Days Post-Op Procedure(s) (LRB): VIDEO ASSISTED THORACOSCOPY, lung biopsy (Left) DRAINAGE OF PLEURAL EFFUSION (Left)  Subjective: Patient wants to go home  Objective: Vital signs in last 24 hours: Temp:  [98.2 F (36.8 C)-98.3 F (36.8 C)] 98.2 F (36.8 C) (02/09 0753) Pulse Rate:  [95-112] 105 (02/09 0537) Cardiac Rhythm:  [-] Sinus tachycardia (02/09 0746) Resp:  [16-23] 17 (02/09 0537) BP: (97-121)/(54-66) 121/60 mmHg (02/09 0537) SpO2:  [90 %-99 %] 98 % (02/09 0537)     Intake/Output from previous day: 02/08 0701 - 02/09 0700 In: 360 [P.O.:360] Out: 600 [Urine:600]   Physical Exam:  Cardiovascular: Tachycardic Pulmonary: Diminished left base; no rales, wheezes, or rhonchi. Abdomen: Soft, non tender, bowel sounds present. Extremities: Mild ankle edema Wounds: Clean and dry.  No erythema or signs of infection.   Lab Results: CBC: Recent Labs  02/12/15 0545 02/14/15 0330  WBC 14.3* 12.4*  HGB 9.0* 8.0*  HCT 27.1* 24.8*  PLT 228 242   BMET:  Recent Labs  02/12/15 0545 02/14/15 0330  NA 130* 135  K 4.2 4.2  CL 91* 98*  CO2 31 30  GLUCOSE 99 130*  BUN 10 5*  CREATININE 0.60 0.43*  CALCIUM 9.2 8.7*    PT/INR: No results for input(s): LABPROT, INR in the last 72 hours. ABG:  INR: Will add last result for INR, ABG once components are confirmed Will add last 4 CBG results once components are confirmed  Assessment/Plan:  1. CV - Remains tachycardic.  2.  Pulmonary - On 4 liters of oxygen via [place]. 3. Duplex LE negative for DVT 4. Anemia-H and H 8 and 24.8. Continue Niferex. 5. Management per primary  Gwynneth Fabio MPA-C 02/14/2015,9:09 AM

## 2015-02-14 NOTE — Progress Notes (Signed)
Discharge instructions given. No questions or concerns at this time Pt d/c'd via family vehicle. Family says they need a nebulizer for home as they are walking out the door. Paged case Freight forwarder. Awaiting call back regarding this issue.

## 2015-02-14 NOTE — Discharge Instructions (Signed)
Follow with Primary MD [name], MD in 7 days   Get CBC, CMP, 2 view Chest X ray checked  by Primary MD next visit.    Activity: As tolerated with Full fall precautions use walker/cane & assistance as needed   Disposition Home     Diet:   Heart Healthy  with feeding assistance and aspiration precautions.  For Heart failure patients - Check your Weight same time everyday, if you gain over 2 pounds, or you develop in leg swelling, experience more shortness of breath or chest pain, call your Primary MD immediately. Follow Cardiac Low Salt Diet and 1.5 lit/day fluid restriction.   On your next visit with your primary care physician please Get Medicines reviewed and adjusted.   Please request your Prim.MD to go over all Hospital Tests and Procedure/Radiological results at the follow up, please get all Hospital records sent to your Prim MD by signing hospital release before you go home.   If you experience worsening of your admission symptoms, develop shortness of breath, life threatening emergency, suicidal or homicidal thoughts you must seek medical attention immediately by calling 911 or calling your MD immediately  if symptoms less severe.  You Must read complete instructions/literature along with all the possible adverse reactions/side effects for all the Medicines you take and that have been prescribed to you. Take any new Medicines after you have completely understood and accpet all the possible adverse reactions/side effects.   Do not drive, operating heavy machinery, perform activities at heights, swimming or participation in water activities or provide baby sitting services if your were admitted for syncope or siezures until you have seen by Primary MD or a Neurologist and advised to do so again.  Do not drive when taking Pain medications.    Do not take more than prescribed Pain, Sleep and Anxiety Medications  Special Instructions: If you have smoked or chewed Tobacco  in the  last 2 yrs please stop smoking, stop any regular Alcohol  and or any Recreational drug use.  Wear Seat belts while driving.   Please note  You were cared for by a hospitalist during your hospital stay. If you have any questions about your discharge medications or the care you received while you were in the hospital after you are discharged, you can call the unit and asked to speak with the hospitalist on call if the hospitalist that took care of you is not available. Once you are discharged, your primary care physician will handle any further medical issues. Please note that NO REFILLS for any discharge medications will be authorized once you are discharged, as it is imperative that you return to your primary care physician (or establish a relationship with a primary care physician if you do not have one) for your aftercare needs so that they can reassess your need for medications and monitor your lab values.

## 2015-02-14 NOTE — Discharge Summary (Addendum)
Alejandra Callahan, is a 70 y.o. female  DOB 1945-12-31  MRN [id number].  Admission date:  02/06/2015  Admitting Physician  Rise Patience, MD  Discharge Date:  02/14/2015   Primary MD  [name], MD  Recommendations for primary care physician for things to follow:   Outpatient follow-up with oncology, palliative care. She may qualify for palliative second line chemotherapy for metastatic endometrial adenocarcinoma. If declines further full comfort care or hospice should be considered.   Admission Diagnosis  Lung mass [R91.8] Dyspnea [R06.00] Pleural effusion, left [J94.8] Pneumothorax, left [J93.9]   Discharge Diagnosis  Lung mass [R91.8] Dyspnea [R06.00] Pleural effusion, left [J94.8] Pneumothorax, left [J93.9]     Active Problems:   Pneumothorax   SOB (shortness of breath)   Lung mass   Pleural effusion, left   Acute respiratory failure with hypoxia (HCC)   Anemia due to other cause      Past Medical History  Diagnosis Date  . Cancer (HCC)     breast  . Arthritis     Rheumatoid arthritis  . PONV (postoperative nausea and vomiting) 1982  . Breast cancer ([facility]) 09/1980    left unilateral mastectomy, no chemo or radiatioin  . Endometrial cancer ([facility]) 2014  . Lung mass   . Pneumonia     hx    Past Surgical History  Procedure Laterality Date  . Mastectomy Left 09/1980  . Tubal ligation  April 1982  . Dilatation & currettage/hysteroscopy with resectocope N/A 11/18/2012    Procedure: DILATATION & CURETTAGE/HYSTEROSCOPY WITH RESECTOCOPE;  Surgeon: Marvene Staff, MD;  Location: [facility];  Service: Gynecology;  Laterality: N/A;  . Breast biopsy Left 09/11/80  . Vaginal hysterectomy  12/09/2012    Complete hysterectomy - [place] placement Right 8/16  . Abdominal hysterectomy    .  Lung biopsy  01/18/15  . Video bronchoscopy with endobronchial navigation N/A 02/04/2015    Procedure: VIDEO BRONCHOSCOPY WITH ENDOBRONCHIAL NAVIGATION;  Surgeon: [name], MD;  Location: [facility];  Service: Thoracic;  Laterality: N/A;  . Video assisted thoracoscopy Left 02/08/2015    Procedure: VIDEO ASSISTED THORACOSCOPY, lung biopsy;  Surgeon: [name], MD;  Location: [facility];  Service: Thoracic;  Laterality: Left;  . Pleural effusion drainage Left 02/08/2015    Procedure: DRAINAGE OF PLEURAL EFFUSION;  Surgeon: [name], MD;  Location: [facility];  Service: Thoracic;  Laterality: Left;       HPI  from the history and physical done on the day of admission:   Alejandra Callahan is an 70 y.o. 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 Patient was discharged from hospital 1/31 s/p lung biopsy where she was noted to have a pneumothorax. Pneumothorax noted to be stable upon discharge. Per patient, coughing worsens when ambulating and trying to eat. Reports BLE swelling  1/31 as well as swelling of right hand that has resolved prior to admission. She denies any recent swallow studies or diet restriction.   Denies CP/ palpitation, back pain, and abd pain. Denies any loss of appetite, N/V/D, and is afebrile. Is on chronic Prednisone PRN for rheumetoid arthritis, last dose was 1/31. Workup revealed hgb 9.4.     Hospital Course:     1-Acute hypoxic Respiratory failure , pneumothorax, hydropneumothorax , left lung masses. pleural effusion - Presents with worsening cough and dyspnea. CT chest was negative for PE but showed hydrothorax and lung nodule, cardiothoracic surgery was consulted. Initially underwent bronc directed lung biopsy by [facility] thoracic surgery which was complicated by a pneumothorax, she then underwent Patient  underwent VATS procedure. Initially required chest tube is meant which was removed on 02/11/2015. Clinically she is better, no chest pain or shortness of breath at this time.  His morning she feels better and wants to be discharged home, I actually offered her couple of more days of hospital stay so that home hospice/palliative care and home PT can be arranged. However patient and her sister want to be discharged right away. I have requested palliative care team to evaluate the patient prior to discharge and have also requested case management to arrange for home help and home palliative care/hospice. She likely will follow-up with her primary oncologist [facility] for possible second line palliative chemotherapy.   Hypotension; much improved after hydration and IV hydrocortisone.  Rheumatoid arthritis, she is chronically on prednisone  Possible adrenal insufficiency. Chronically on prednisone, due to hypotension she was placed on hydrocortisone, will be placed on oral steroid taper upon discharge.   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 Discussed with her on 02/13/2015, patient's prognosis is extremely poor, she is resistant to the first-line treatment. Options are second line chemotherapy if she is strong enough to tolerate it at any rate that will be palliative as well. Dr. Whitney Muse recommends involving palliative care which we will do.  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 Anemia panel: B 12 574, Folate 14, iron 25, ferritin 250. Iron deficiency anemia. Started ferrous sulfate, Hb stable.   Sinus tachycardia. due to #1 above. She had a stable TSH and echogram, improved with hydration, encourage to be hydrated at home, lower extremity venous duplex negative and he was ruled out with CT angiogram of the chest during admission. She is not hypoxic or short of breath. Currently no chest pain  either.    Discharge Condition: Guarded  Follow UP  Follow-up Information    Follow up with [name], MD On 02/27/2015.   Specialty:  Cardiothoracic Surgery   Why:  PA/LAT CXR to be taken (at [facility] which is in the same building as Dr. Leonarda Salon office) on 02/15/2015 at 1:45 pm;Appointment time is at 2:30 pm   Contact information:   [place] [id number] [phone]       Follow up with [name], MD. Schedule an appointment as soon as possible for a visit in 4 days.   Specialty:  Internal Medicine   Contact information:   [place] [address] [phone]       Follow up with Molli Hazard, MD. Schedule an appointment as soon as possible for a visit in 4 days.   Specialties:  Hematology and Oncology, Oncology   Contact information:   [place]  [place] obtained - Dr Princess Perna , Dr Whitney Muse Oncology over the phone 02-13-15, Pall Care  Diet and Activity recommendation: See Discharge Instructions below  Discharge Instructions           Discharge Instructions    Diet - low sodium heart healthy    Complete by:  As directed      Discharge instructions    Complete by:  As directed   Follow with Primary MD [name], MD in 7 days   Get CBC, CMP, 2 view Chest X ray checked  by Primary MD next visit.    Activity: As tolerated with Full fall precautions use walker/cane & assistance as needed   Disposition Home     Diet:   Heart Healthy  with feeding assistance and aspiration precautions.  For Heart failure patients - Check your Weight same time everyday, if you gain over 2 pounds, or you develop in leg swelling, experience more shortness of breath or chest pain, call your Primary MD immediately. Follow Cardiac Low Salt Diet and 1.5 lit/day fluid restriction.   On your next visit with your primary care physician please Get Medicines  reviewed and adjusted.   Please request your Prim.MD to go over all Hospital Tests and Procedure/Radiological results at the follow up, please get all Hospital records sent to your Prim MD by signing hospital release before you go home.   If you experience worsening of your admission symptoms, develop shortness of breath, life threatening emergency, suicidal or homicidal thoughts you must seek medical attention immediately by calling 911 or calling your MD immediately  if symptoms less severe.  You Must read complete instructions/literature along with all the possible adverse reactions/side effects for all the Medicines you take and that have been prescribed to you. Take any new Medicines after you have completely understood and accpet all the possible adverse reactions/side effects.   Do not drive, operating heavy machinery, perform activities at heights, swimming or participation in water activities or provide baby sitting services if your were admitted for syncope or siezures until you have seen by Primary MD or a Neurologist and advised to do so again.  Do not drive when taking Pain medications.    Do not take more than prescribed Pain, Sleep and Anxiety Medications  Special Instructions: If you have smoked or chewed Tobacco  in the last 2 yrs please stop smoking, stop any regular Alcohol  and or any Recreational drug use.  Wear Seat belts while driving.   Please note  You were cared for by a hospitalist during your hospital stay. If you have any questions about your discharge medications or the care you received while you were in the hospital after you are discharged, you can call the unit and asked to speak with the hospitalist on call if the hospitalist that took care of you is not available. Once you are discharged, your primary care physician will handle any further medical issues. Please note that NO REFILLS for any discharge medications will be authorized once you are discharged,  as it is imperative that you return to your primary care physician (or establish a relationship with a primary care physician if you do not have one) for your aftercare needs so that they can reassess your need for medications and monitor your lab values.     Increase activity slowly    Complete by:  As directed  Discharge Medications       Medication List    TAKE these medications        acetaminophen 500 MG tablet  Commonly known as:  TYLENOL  Take 500 mg by mouth every 6 (six) hours as needed for mild pain or moderate pain.     albuterol (2.5 MG/3ML) 0.083% nebulizer solution  Commonly known as:  PROVENTIL  Take 3 mLs (2.5 mg total) by nebulization every 4 (four) hours as needed for wheezing.     alendronate 70 MG tablet  Commonly known as:  FOSAMAX  Take 70 mg by mouth once a week. Take with a full glass of water on an empty stomach. Monday     bisacodyl 5 MG EC tablet  Commonly known as:  DULCOLAX  Take 2 tablets (10 mg total) by mouth daily as needed for moderate constipation.     CALTRATE 600 PLUS-VIT D PO  Take 1 each by mouth 2 (two) times daily.     cholecalciferol 1000 units tablet  Commonly known as:  VITAMIN D  Take 1,000 Units by mouth at bedtime.     feeding supplement (ENSURE ENLIVE) Liqd  Take 237 mLs by mouth 2 (two) times daily between meals.     ferrous sulfate 325 (65 FE) MG tablet  Take 1 tablet (325 mg total) by mouth 2 (two) times daily with a meal.     folic acid 174 MCG tablet  Commonly known as:  FOLVITE  Take 800 mcg by mouth daily.     LORazepam 2 MG/ML concentrated solution  Commonly known as:  ATIVAN  Take 0.5 mLs (1 mg total) by mouth every 6 (six) hours as needed for anxiety.     methotrexate 2.5 MG tablet  Commonly known as:  RHEUMATREX  Take 10 mg by mouth 2 (two) times a week. Caution:Chemotherapy. Protect from light.  Pt to takes 2.'5mg'$  tabs- 4 tablets on Friday and 4 tablets on Saturday     morphine CONCENTRATE  10 MG/0.5ML Soln concentrated solution  Take 0.5 mLs (10 mg total) by mouth every 3 (three) hours as needed for moderate pain or severe pain.     multivitamin tablet  Take 1 tablet by mouth daily.     predniSONE 5 MG tablet  Commonly known as:  DELTASONE  Label  & dispense according to the schedule below. 10 Pills PO for 3 days then, 8 Pills PO for 3 days, 6 Pills PO for 3 days, 4 Pills PO for 3 days, 2 Pills PO for 3 days, 1 Pills PO for 3 days, 1/2 Pill  PO for 3 days then STOP. Total 95 pills.     predniSONE 5 MG tablet  Commonly known as:  DELTASONE  Take 5 mg by mouth daily as needed (flare up).     raloxifene 60 MG tablet  Commonly known as:  EVISTA  Take 60 mg by mouth daily.        Major procedures and Radiology Reports - PLEASE review detailed and final reports for all details, in brief -    S/P VATS with L. Lung Biopsy and L. chest tube     Dg Chest 2 View  02/13/2015  CLINICAL DATA:  Pleural effusion.  Shortness of breath and cough. EXAM: CHEST  2 VIEW COMPARISON:  02/12/2015 FINDINGS: Right jugular Port-A-Cath remains in place with tip overlying the lower SVC. Masses are again seen along the left heart and mediastinal border. Small left pneumothorax is unchanged. There  is a small left pleural effusion. Mild right basilar atelectasis is unchanged. IMPRESSION: 1. Unchanged, small left pneumothorax. 2. Small left pleural effusion. 3. Similar appearance of masses along the left heart border. Electronically Signed   By: Logan Bores M.D.   On: 02/13/2015 08:01   Dg Chest 2 View  02/12/2015  CLINICAL DATA:  Lung tumor.  Chemotherapy. EXAM: CHEST  2 VIEW COMPARISON:  02/11/2015.  CT 02/06/2015. FINDINGS: Port-A-Cath in stable position. Large left lung mass unchanged. Right lower lobe pulmonary nodules best demonstrated by CT. Left pleural effusion. Tiny residual left sided pneumothorax, improved from prior exam. Left mastectomy. Surgical clips left axilla. No acute bony abnormality.  IMPRESSION: 1. Port-A-Cath in stable position. 2. Tiny residual left pneumothorax, improved from prior exam. 3. Persistent large left lung mass, unchanged. Persistent left pleural effusion, unchanged. Pulmonary nodules previously identified in the right lung base are best demonstrated by prior CT. Electronically Signed   By: Marcello Moores  Register   On: 02/12/2015 08:04   Dg Chest 2 View  02/06/2015  CLINICAL DATA:  70 year old female with increasing shortness of breath. History of lung biopsy 2 days ago. Nonproductive cough for the past 7 months. EXAM: CHEST  2 VIEW COMPARISON:  Chest x-ray 02/04/2014. FINDINGS: Large left pleural effusion. Multiple known pleural-based masses in the left hemithorax are difficult to discretely visualize on today's examination secondary to the large left pleural effusion (see chest CT 01/24/2015 for further details). Right lung is clear. No right pleural effusion. Small right-sided pneumothorax, slightly decreased in size compared with yesterday's examination. No evidence of pulmonary edema. Cardiac silhouette is largely obscured, but heart size appears normal. Mediastinal contours are distorted by patient's positioning. Atherosclerosis in the thoracic aorta. Right internal jugular single-lumen porta cath with tip terminating at the superior cavoatrial junction. IMPRESSION: 1. Slight decrease in pneumothorax component of left-sided hydropneumothorax compared to yesterday's examination. The appearance the chest is otherwise essentially unchanged, as discussed above. Electronically Signed   By: Vinnie Langton M.D.   On: 02/06/2015 18:48   Dg Chest 2 View  02/05/2015  CLINICAL DATA:  Pneumothorax followup EXAM: CHEST  2 VIEW COMPARISON:  02/04/2015 FINDINGS: Slight increase in left apical pneumothorax. No change in lingular mass and left effusion. Mild right lower lobe atelectasis also unchanged. Port-A-Cath tip in the SVC IMPRESSION: Slight increase in left apical pneumothorax No  change lingular mass and left effusion. Mild right lower lobe atelectasis also unchanged. Electronically Signed   By: Franchot Gallo M.D.   On: 02/05/2015 07:28   Dg Chest 2 View  02/04/2015  CLINICAL DATA:  Pre bronchoscopy. EXAM: CHEST  2 VIEW COMPARISON:  Chest CT 01/24/2015 FINDINGS: Large left-sided mass contiguous with the left heart border correlating with previous CT and PET findings. There is a small left pleural effusion which is stable. No superimposed pneumonia or edema. No air leak. Status post left mastectomy and axillary dissection. Right-sided porta catheter with tip at the SVC level. IMPRESSION: Known left-sided mass and small pleural effusion. No change since 01/24/2015 CT. Electronically Signed   By: Monte Fantasia M.D.   On: 02/04/2015 06:33   Ct Angio Chest Pe W/cm &/or Wo Cm  02/06/2015  CLINICAL DATA:  Cough and shortness of breath for 2 days. Symptoms after having bronchoscopy. Patient with history of left breast cancer and endometrial cancer with lung metastasis. EXAM: CT ANGIOGRAPHY CHEST WITH CONTRAST TECHNIQUE: Multidetector CT imaging of the chest was performed using the standard protocol during bolus administration of intravenous  contrast. Multiplanar CT image reconstructions and MIPs were obtained to evaluate the vascular anatomy. CONTRAST:  173m OMNIPAQUE IOHEXOL 350 MG/ML SOLN COMPARISON:  Most recent chest CT 01/24/2015. Most recent chest radiograph earlier this day at 1824 hour FINDINGS: There are no filling defects within the pulmonary arteries to suggest pulmonary embolus, with limitations secondary to breathing motion artifact (particularly in the right lung base) and left lung atelectasis. A questionable filling defect seen only on a single image series 10 image 163 is felt to be artifactual. Small left anterior apical pneumothorax. Left pleural effusion with increase from CT 13 days prior, now moderate in size. There is no definite pleural nodularity. Pleural effusion  causes marked atelectasis in the left lower and to a lesser extent dependent left upper lobe. No right pleural effusion. The left paramediastinal masses have increased in size in the short interim, current measurement 6.0 x 9.0 cm (previously 7.0 x 4.7 cm), of the superior lesion; more inferior lesion also increased in size 6.4 x 5.0 cm (previously 5.2 x 3.2 cm). Masslike density between these 2 lesions has also increased. Right lower lobe pulmonary nodule measures 12 x 11 mm, previously 7 x 7 mm, with adjacent cystic space unchanged. There is mild rightward mediastinal shift, new from prior CT. No definite mediastinal adenopathy. No pericardial effusion. Esophagus is patulous and fluid-filled. Unchanged appearance of the upper abdomen. 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 No focal osseous lesions are seen. Review of the MIP images confirms the above findings. IMPRESSION: 1. No pulmonary embolus allowing for limitations secondary to breathing artifact and left pleural effusion. 2. Left hydro pneumothorax, with small pneumothorax component anterior apical and moderate hydrothorax. Pleural fluid measures simple fluid density. 3. Increased size of the left paramediastinal masses in the short interim (13 days). Right lower lobe 11 mm pulmonary nodule has also increased in the interim. 4. There is mild rightward mediastinal shift from prior CT, unclear whether this is secondary to pleural effusion, left-sided pulmonary masses, or pneumothorax, though the relative size of pneumothorax component compared to pleural effusion argues against this as etiology. These results were called by telephone at the time of interpretation on 02/06/2015 at 9:24 pm to Dr. KFrancine Graven, who verbally acknowledged these results. Electronically Signed   By: MJeb LeveringM.D.   On: 02/06/2015 21:26   Ct Biopsy  02/07/2015  ADDENDUM REPORT: 02/07/2015 16:10 ADDENDUM: Nursing monitored the patient during sedation per protocol.  Electronically Signed   By: AMarybelle KillingsM.D.   On: 02/07/2015 16:10  02/07/2015  CLINICAL DATA:  Left upper lobe lung mass EXAM: CT-GUIDED BIOPSY LEFT UPPER LOBE LUNG MASS.  CORE. MEDICATIONS AND MEDICAL HISTORY: Versed 1 mg, Fentanyl 50 mcg. Additional Medications: None. ANESTHESIA/SEDATION: Moderate sedation time: 10 minutes PROCEDURE: The procedure, risks, benefits, and alternatives were explained to the patient. Questions regarding the procedure were encouraged and answered. The patient understands and consents to the procedure. The anterior thorax was prepped with ChloraPrep in a sterile fashion, and a sterile drape was applied covering the operative field. A sterile gown and sterile gloves were used for the procedure. Under CT guidance, a(n) 17 gauge guide needle was advanced into the left upper lobe lung mass. Subsequently four 18 gauge core biopsies were obtained. The guide needle was removed. Final imaging was performed. Patient tolerated the procedure well without complication. Vital sign monitoring by nursing staff during the procedure will continue as patient is in the special procedures unit for post procedure observation. FINDINGS: The  images document guide needle placement within the left upper lobe lung mass. Post biopsy images demonstrate no hemorrhage. COMPLICATIONS: None IMPRESSION: Successful CT-guided left upper lobe lung mass core biopsy. Electronically Signed: By: Marybelle Killings M.D. On: 01/18/2015 12:43   Dg Chest 1v Repeat Same Day  02/04/2015  CLINICAL DATA:  Follow-up pneumothorax. EXAM: CHEST - 1 VIEW SAME DAY COMPARISON:  Chest x-ray from earlier same day. FINDINGS: An interval slight decrease in size of the left apical pneumothorax, now measuring 1 cm or less of pleural displacement (previously up to 2 cm). Cardiomediastinal silhouette is stable in size and configuration. Stable appearance of the left lower lobe mass. Probable slight increase in the bilateral perihilar edema. Right  chest wall Port-A-Cath is stable in position with tip projected over the lower SVC. IMPRESSION: 1. Slight interval DECREASE in the size of the left apical pneumothorax, with measurements given above. 2. Mild perihilar edema, increased in the short-term interval, suggesting mild volume overload/CHF. 3. Left lower lobe mass, stable in the short-term interval, better seen on recent chest CT. 4. Stable cardiomegaly. Electronically Signed   By: Franki Cabot M.D.   On: 02/04/2015 13:16   Dg Chest Port 1 View  02/11/2015  CLINICAL DATA:  Status post left chest tube removal. Short of breath and cough. EXAM: PORTABLE CHEST 1 VIEW COMPARISON:  02/11/2015 at 6:12 a.m., and older exams. FINDINGS: Mass superimposed on the cardiac silhouette is stable. Left chest tube has been removed. Small left apical pneumothorax is decreased in size when compared the prior study. No new lung opacities. Right anterior chest wall Port-A-Cath is stable. IMPRESSION: 1. Small left pneumothorax, decreased in size from the earlier study despite removal of the left chest tube. 2. No new abnormalities. Electronically Signed   By: Lajean Manes M.D.   On: 02/11/2015 12:05   Dg Chest Port 1 View  02/11/2015  CLINICAL DATA:  Chest tube placement for pneumothorax EXAM: PORTABLE CHEST 1 VIEW COMPARISON:  February 10, 2015 chest radiograph and chest CT February 06, 2015 FINDINGS: Chest tube remains on the left. There is a persistent small pneumothorax on the left with apical and basilar components, not changed. No tension component. There remains a sizable left lower lobe mass, somewhat ill-defined by radiography but grossly stable. There is a superimposed left pleural effusion. On the right, there is slightly less atelectasis in the right base. No new opacity on the right. Port-A-Cath tip is at the cavoatrial junction. No pneumothorax is apparent on the right. The heart appears enlarged but stable. The pulmonary vascularity is within normal limits.  There are surgical clips in the left axillary region. IMPRESSION: Persistent small pneumothorax on the left without tension component. No change in tube and catheter positions. Left lower lobe mass with effusion remains. There may well be associated pneumonitis in this area on the left. On the right, there is less right base atelectasis. No new opacity on the right. No change in cardiac silhouette. Electronically Signed   By: Lowella Grip III M.D.   On: 02/11/2015 07:18   Dg Chest Port 1 View  02/10/2015  CLINICAL DATA:  Patient with left-sided chest tube. History of pneumonia. Lung mass. EXAM: PORTABLE CHEST 1 VIEW COMPARISON:  Chest radiograph 02/09/2015 FINDINGS: Central venous catheter tip projects over the superior vena cava, unchanged. Interval removal of 1 of the 2 left chest tubes. New small left pneumothorax. Re- demonstrated large left lung mass. Persistent small left pleural effusion. Unchanged heterogeneous opacities right lung base.  IMPRESSION: Interval removal of 1 of the left-sided chest tubes. Single chest tube remains in place. There is a new small left pneumothorax. Unchanged large left lung mass and pleural effusion. Critical Value/emergent results were called by telephone at the time of interpretation on 02/10/2015 at 10:38 am to Dr. Ellwood Handler , who verbally acknowledged these results. Electronically Signed   By: Lovey Newcomer M.D.   On: 02/10/2015 10:39   Dg Chest Port 1 View  02/09/2015  CLINICAL DATA:  Pleural effusions EXAM: PORTABLE CHEST 1 VIEW COMPARISON:  02/08/2015 FINDINGS: RIGHT power port. 2 LEFT chest tubes at the LEFT apex. No appreciable pneumothorax. Large LEFT lung mass again demonstrated. LEFT effusion unchanged. A patchy nodularity in the RIGHT lung. IMPRESSION: 1. Stable support apparatus. 2. No interval change. 3. Large LEFT lung mass. 4. Patchy nodularity in the RIGHT lung. Electronically Signed   By: Suzy Bouchard M.D.   On: 02/09/2015 09:44   Dg Chest Port 1  View  02/08/2015  CLINICAL DATA:  Postop EXAM: PORTABLE CHEST 1 VIEW COMPARISON:  02/06/2015 FINDINGS: There are now 2 left chest tubes. No pneumothorax. Large mass in the left mid and lower lung zones is stable. There is less density over the left lower lung zone likely related to resolution of the left pleural effusion. Right jugular Port-A-Cath is stable. Linear atelectasis in the right mid and lower lung zones. Cardiomegaly. IMPRESSION: Left chest tubes are now in place and there is no left pneumothorax. Subsegmental atelectasis in the right mid and lower lung zones. Resolved left pleural effusion. Electronically Signed   By: Marybelle Killings M.D.   On: 02/08/2015 12:32   Dg Chest Port 1 View  02/04/2015  CLINICAL DATA:  Pneumothorax, post bronchoscopy and biopsy EXAM: PORTABLE CHEST 1 VIEW COMPARISON:  02/04/2015 FINDINGS: Cardiomegaly again noted. Again noted left lower hemithorax mass silhouetting the left heart border. There is about 20% left upper and left lateral pneumothorax measures 2.2 cm maximum thickness superiorly. Surgical clips are noted in left axilla. Right IJ Port-A-Cath with tip in SVC right atrium junction. IMPRESSION: Left side mass again noted silhouetting the left heart border. There is about 20% left upper and left lateral pneumothorax. Surgical clips are noted in left axilla. These results were called by telephone at the time of interpretation on 02/04/2015 at 10:49 am to Dr. Modesto Charon , who verbally acknowledged these results. Electronically Signed   By: Lahoma Crocker M.D.   On: 02/04/2015 10:49   Dg Chest Port 1 View  01/18/2015  CLINICAL DATA:  Status post lung biopsy. EXAM: PORTABLE CHEST 1 VIEW COMPARISON:  July 24, 2014. FINDINGS: No pneumothorax is seen status post left lung biopsy. Right internal jugular Port-A-Cath is again noted with distal tip in expected position of SVC. Left axillary surgical clips are noted. Large left lung mass is again noted. Right lung is clear.  Bony thorax is unremarkable. IMPRESSION: No pneumothorax seen status post biopsy of large left lung mass. Electronically Signed   By: Marijo Conception, M.D.   On: 01/18/2015 14:05   Dg Swallowing Func-speech Pathology  02/07/2015  Objective Swallowing Evaluation: Type of Study: MBS-Modified Barium Swallow Study Patient Details Name: BREEZY HERTENSTEIN MRN: [id number] Date of Birth: 25-Sep-1945 Today's Date: 02/07/2015 Time: SLP Start Time (ACUTE ONLY): 0948-SLP Stop Time (ACUTE ONLY): 1000 SLP Time Calculation (min) (ACUTE ONLY): 12 min Past Medical History: Past Medical History Diagnosis Date . Cancer (HCC)    breast . Arthritis  Rheumatoid arthritis . PONV (postoperative nausea and vomiting) 1982 . Breast cancer ([place]) 09/1980   left unilateral mastectomy, no chemo or radiatioin . Endometrial cancer ([place]) 2014 . Lung mass  . Pneumonia    hx Past Surgical History: Past Surgical History Procedure Laterality Date . Mastectomy Left 09/1980 . Tubal ligation  April 1982 . Dilatation & currettage/hysteroscopy with resectocope N/A 11/18/2012   Procedure: DILATATION & CURETTAGE/HYSTEROSCOPY WITH RESECTOCOPE;  Surgeon: Marvene Staff, MD;  Location: [facility];  Service: Gynecology;  Laterality: N/A; . Breast biopsy Left 09/11/80 . Vaginal hysterectomy  12/09/2012   Complete hysterectomy - Laureles placement Right 8/16 . Abdominal hysterectomy   . Lung biopsy  01/18/15 . Video bronchoscopy with endobronchial navigation N/A 02/04/2015   Procedure: VIDEO BRONCHOSCOPY WITH ENDOBRONCHIAL NAVIGATION;  Surgeon: [name], MD;  Location: [facility];  Service: Thoracic;  Laterality: N/A; HPI: Alejandra Callahan is an 70 y.o. 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 Patient was discharged from hospital 1/31 s/p lung biopsy where she was noted to have a pneumothorax. Pneumothorax noted to be stable upon discharge. Per patient, coughing worsens when  ambulating and trying to eat. MD questioning aspiration and bronchospasm as cause of acute decompensation.  No Data Recorded Assessment / Plan / Recommendation CHL IP CLINICAL IMPRESSIONS 02/07/2015 Therapy Diagnosis WFL Clinical Impression Patient presents with largely normal oropharyngeal swallowing function with intermittent delay in swallow initiation but with good airway protection . No frank penetration or aspiration observed. Esophageal sweep revealed no significant abnormalities, possible mild stasis in lower esophagus however MD not present to confirm.  Impact on safety and function Mild aspiration risk   CHL IP TREATMENT RECOMMENDATION 02/07/2015 Treatment Recommendations No treatment recommended at this time   No flowsheet data found. CHL IP DIET RECOMMENDATION 02/07/2015 SLP Diet Recommendations Regular solids;Thin liquid Liquid Administration via Cup;Straw Medication Administration Whole meds with liquid Compensations Slow rate;Small sips/bites Postural Changes Remain semi-upright after after feeds/meals (Comment);Seated upright at 90 degrees   CHL IP OTHER RECOMMENDATIONS 02/07/2015 Recommended Consults -- Oral Care Recommendations Oral care BID Other Recommendations --   CHL IP FOLLOW UP RECOMMENDATIONS 02/07/2015 Follow up Recommendations None   No flowsheet data found.     CHL IP ORAL PHASE 02/07/2015 Oral Phase WFL Oral - Pudding Teaspoon -- Oral - Pudding Cup -- Oral - Honey Teaspoon -- Oral - Honey Cup -- Oral - Nectar Teaspoon -- Oral - Nectar Cup -- Oral - Nectar Straw -- Oral - Thin Teaspoon -- Oral - Thin Cup -- Oral - Thin Straw -- Oral - Puree -- Oral - Mech Soft -- Oral - Regular -- Oral - Multi-Consistency -- Oral - Pill -- Oral Phase - Comment --  CHL IP PHARYNGEAL PHASE 02/07/2015 Pharyngeal Phase WFL Pharyngeal- Pudding Teaspoon -- Pharyngeal -- Pharyngeal- Pudding Cup -- Pharyngeal -- Pharyngeal- Honey Teaspoon -- Pharyngeal -- Pharyngeal- Honey Cup -- Pharyngeal -- Pharyngeal- Nectar Teaspoon --  Pharyngeal -- Pharyngeal- Nectar Cup -- Pharyngeal -- Pharyngeal- Nectar Straw -- Pharyngeal -- Pharyngeal- Thin Teaspoon -- Pharyngeal -- Pharyngeal- Thin Cup -- Pharyngeal -- Pharyngeal- Thin Straw -- Pharyngeal -- Pharyngeal- Puree -- Pharyngeal -- Pharyngeal- Mechanical Soft -- Pharyngeal -- Pharyngeal- Regular -- Pharyngeal -- Pharyngeal- Multi-consistency -- Pharyngeal -- Pharyngeal- Pill -- Pharyngeal -- Pharyngeal Comment --  CHL IP CERVICAL ESOPHAGEAL PHASE 02/07/2015 Cervical Esophageal Phase WFL Pudding Teaspoon -- Pudding Cup -- Honey Teaspoon --  Honey Cup -- Nectar Teaspoon -- Nectar Cup -- Nectar Straw -- Thin Teaspoon -- Thin Cup -- Thin Straw -- Puree -- Mechanical Soft -- Regular -- Multi-consistency -- Pill -- Cervical Esophageal Comment -- No flowsheet data found. Alejandra Rainwater MA, CCC-SLP [phone] Alejandra Callahan 02/07/2015, 12:28 PM              Ct Super D Chest W Contrast  01/24/2015  CLINICAL DATA:  Subsequent treatment for breast cancer and endometrial carcinoma. Interval enlargement of LEFT pulmonary masses on comparison PET-CT scan. Last chemotherapy November 2016 EXAM: CT CHEST WITH CONTRAST TECHNIQUE: Multidetector CT imaging of the chest was performed using thin slice collimation for electromagnetic bronchoscopy planning purposes, with intravenous contrast. CONTRAST:  76m OMNIPAQUE IOHEXOL 300 MG/ML  SOLN COMPARISON:  PET-CT 01/14/2015 FINDINGS: Mediastinum/Lymph Nodes: No axillary or supraclavicular lymphadenopathy. Port in RIGHT chest wall. No new mediastinal adenopathy. No pericardial fluid. Esophagus mildly thickened distally. Small of fluid esophagus. The central pulmonary embolism. Lungs/Pleura: Large of pulmonary mass is abutting the pericardial surface is again demonstrated. The more superior mass measures 7.0 x 4.6 increased from 5.8 by 4.2 cm. More inferior mass adjacent to the LEFT cardiac border measures 5.2 by 3.2 cm increased from 3.3 by 2.3 cm. Lesion position between  the 2 dominant nodules is also increased (image 31, series 3) Trace bilateral pleural effusions. No specific pulmonary nodularity. The upper abdomen is normal adrenal glands. Tiny hypodense lesion liver on image 46, series 2 is likely benign. Review of the skeleton is unremarkable. IMPRESSION: 1. Significant short interval enlargement of pulmonary masses in the LEFT hemithorax along the LEFT mediastinal / cardiac border consistent with progression of metastatic process. 2. No mediastinal adenopathy. No additional pulmonary parenchymal lesions. These results will be called to the ordering clinician or representative by the Radiologist Assistant, and communication documented in the PACS or zVision Dashboard. Electronically Signed   By: SSuzy BouchardM.D.   On: 01/24/2015 17:29   Dg C-arm Bronchoscopy  02/04/2015  CLINICAL DATA:  C-ARM BRONCHOSCOPY Fluoroscopy was utilized by the requesting physician.  No radiographic interpretation.    Micro Results      Recent Results (from the past 240 hour(s))  Surgical pcr screen     Status: None   Collection Time: 02/08/15  2:04 AM  Result Value Ref Range Status   MRSA, PCR NEGATIVE NEGATIVE Final   Staphylococcus aureus NEGATIVE NEGATIVE Final    Comment:        The Xpert SA Assay (FDA approved for NASAL specimens in patients over 22years of age), is one component of a comprehensive surveillance program.  Test performance has been validated by [facility] greater than or equal to 124year old. It is not intended to diagnose infection nor to guide or monitor treatment.   Fungus Culture with Smear     Status: None (Preliminary result)   Collection Time: 02/08/15 11:01 AM  Result Value Ref Range Status   Specimen Description FLUID PLEURAL LEFT  Final   Special Requests NONE  Final   Fungal Smear   Final    NO YEAST OR FUNGAL ELEMENTS SEEN [facility]   Culture   Final    CULTURE IN PROGRESS FOR FOUR  WEEKS Performed at [facility]   Report Status PENDING  Incomplete  [facility]     Status: None (Preliminary result)   Collection Time: 02/08/15 11:01 AM  Result Value Ref Range Status   Specimen Description FLUID PLEURAL LEFT  Final   Special Requests NONE  Final   Acid Fast Smear   Final    NO ACID FAST BACILLI SEEN Performed at [facility]    Culture   Final    CULTURE WILL BE EXAMINED FOR 6 WEEKS BEFORE ISSUING A FINAL REPORT Performed at [facility]    Report Status PENDING  Incomplete  Culture, body fluid-bottle     Status: None   Collection Time: 02/08/15 11:01 AM  Result Value Ref Range Status   Specimen Description FLUID LEFT PLEURAL  Final   Special Requests NONE  Final   Culture NO GROWTH 5 DAYS  Final   Report Status 02/13/2015 FINAL  Final  Gram stain     Status: None   Collection Time: 02/08/15 11:01 AM  Result Value Ref Range Status   Specimen Description FLUID LEFT PLEURAL  Final   Special Requests NONE  Final   Gram Stain   Final    MODERATE WBC PRESENT,BOTH PMN AND MONONUCLEAR NO ORGANISMS SEEN    Report Status 02/08/2015 FINAL  Final       Today   Subjective    Alejandra Callahan today has no headache,no chest abdominal pain,no new weakness tingling or numbness, feels much better wants to go home today.    Objective   Blood pressure 107/58, pulse 99, temperature 98.2 F (36.8 C), temperature source Oral, resp. rate 23, height '4\' 11"'$  (1.499 m), weight 55.2 kg (121 lb 11.1 oz), SpO2 90 %.   Intake/Output Summary (Last 24 hours) at 02/14/15 1033 Last data filed at 02/14/15 0731  Gross per 24 hour  Intake    360 ml  Output    825 ml  Net   -465 ml    Exam Awake Alert, Oriented x 3, No new F.N deficits, Normal affect [place].AT,PERRAL Supple Neck,No JVD, No cervical lymphadenopathy appriciated.  Symmetrical Chest wall movement, Good air movement bilaterally, bibasilar rales, R Pota.Cath RRR,No  Gallops,Rubs or new Murmurs, No Parasternal Heave +ve B.Sounds, Abd Soft, Non tender, No organomegaly appriciated, No rebound -guarding or rigidity. No Cyanosis, Clubbing or edema, No new Rash or bruise   Data Review   CBC w Diff:  Lab Results  Component Value Date   WBC 12.4* 02/14/2015   HGB 8.0* 02/14/2015   HCT 24.8* 02/14/2015   PLT 242 02/14/2015   LYMPHOPCT 20 02/06/2015   BANDSPCT 62* 08/16/2014   MONOPCT 11 02/06/2015   EOSPCT 9 02/06/2015   BASOPCT 0 02/06/2015    CMP:  Lab Results  Component Value Date   NA 135 02/14/2015   K 4.2 02/14/2015   CL 98* 02/14/2015   CO2 30 02/14/2015   BUN 5* 02/14/2015   CREATININE 0.43* 02/14/2015   PROT 4.5* 02/10/2015   ALBUMIN 2.1* 02/10/2015   BILITOT 0.3 02/10/2015   ALKPHOS 65 02/10/2015   AST 24 02/10/2015   ALT 16 02/10/2015  .   Total Time in preparing paper work, data evaluation and todays exam - 35 minutes  Thurnell Lose M.D on 02/14/2015 at 10:33 AM  [facility]   Office  [phone]

## 2015-02-14 NOTE — Consult Note (Signed)
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  She has 2 left lung masses that are growing rapidly.  She developed acute hypoxic respiratory failure and was admitted on 2/1.  Unfortunately she had developed a pneumothorax during a procedure for biopsy at the end of January.  The pneumothorax was stable on 1/31 when she was discharged from the hospital, but she developed a troublesome cough and shortness of breath at home.  I was felt she was likely aspirating and experiencing bronchospasm.  Now at the end of her hospital stay her breathing is much improved.  We talked about her illness and her support at home.  Unfortunately her husband is at home with private 24 hour aides as he has end stage liver disease and is frequently encephalopathic.  She appears to have good support from her children.  We discussed the fact that she is eligible for hospice services in her home with a diagnosis of less than 6 months.     Unfortunately our conversation was cut short before we could discuss code status or a full goals of care, but [name] decided to investigate hospice services.  She will go home on oxygen with nebulizers and follow up with her oncologist.  Code status and Goals need to be visited soon.  A "hard choices" booklet was given to the patient.  Contacts/Participants in Discussion:  Patient and sister Alejandra Callahan. Primary Decision Maker: Patient.   SUMMARY OF RECOMMENDATIONS  Code Status/Advance Care Planning: Full  code    Code Status Orders        Start     Ordered   02/07/15 0213  Full code   Continuous     02/07/15 0212    Code Status History    Date Active Date Inactive Code Status Order ID Comments User Context   This patient has a current code status but no historical code status.    Advance Directive Documentation        Most Recent Value   Type of Advance Directive  Living will   Pre-existing out of facility DNR order (yellow form or pink MOST form)     "MOST" Form in Place?         Psycho-social/Spiritual:  Support System: Strong  Prognosis:  Less than 6 months given rapidly growing lung masses secondary to metastatic endometrial cancer that is insensitive to chemotherapy.  Discharge Planning: Home with Hospice   Chief Complaint/ Primary Diagnoses: Present on Admission:  . SOB (shortness of breath) . Pneumothorax  I have reviewed the medical record, interviewed the patient and family, and examined the patient. The following aspects are pertinent.  Past Medical History  Diagnosis Date  . Cancer (HCC)     breast  . Arthritis     Rheumatoid arthritis  . PONV (postoperative nausea and vomiting) 1982  . Breast cancer ([place]) 09/1980    left unilateral mastectomy, no chemo or radiatioin  . Endometrial cancer ([place]) 2014  . Lung mass   . Pneumonia     hx   Social History   Social History  .  Marital Status: Married    Spouse Name: N/A  . Number of Children: 2  . Years of Education: N/A   Social History Main Topics  . Smoking status: Never Smoker   . Smokeless tobacco: Never Used  . Alcohol Use: No  . Drug Use: No  . Sexual Activity: Not Asked   Other Topics Concern  . None   Social History Narrative   Family History  Problem Relation Age of Onset  . Breast cancer Mother 36  . Heart attack Mother 36  . Colon cancer Father 50  . Heart attack Father 43  . Breast cancer Sister 15  . Heart attack Paternal Grandfather 3  . Breast cancer Other      mother's paternal first cousin dx in late 40s-early 74s   Scheduled Meds: . antiseptic oral rinse  7 mL Mouth Rinse BID  . benzonatate  200 mg Oral TID  . calcium-vitamin D  1 tablet Oral BID  . cholecalciferol  1,000 Units Oral QHS  . enoxaparin (LOVENOX) injection  40 mg Subcutaneous Q24H  . feeding supplement (ENSURE ENLIVE)  237 mL Oral BID BM  . folic acid  1 mg Oral Daily  . hydrocortisone sod succinate (SOLU-CORTEF) inj  100 mg Intravenous Q8H  . Influenza vac split quadrivalent PF  0.5 mL Intramuscular Once  . iron polysaccharides  150 mg Oral Daily  . lung surgery book   Does not apply Once  . multivitamin with minerals  1 tablet Oral Daily  . raloxifene  60 mg Oral Daily  . scopolamine  1 patch Transdermal Q72H   Continuous Infusions:  PRN Meds:.albuterol, bisacodyl, HYDROmorphone, [DISCONTINUED] ondansetron **OR** ondansetron (ZOFRAN) IV, potassium chloride, senna-docusate, traMADol Medications Prior to Admission:  Prior to Admission medications   Medication Sig Start Date End Date Taking? Authorizing Provider  acetaminophen (TYLENOL) 500 MG tablet Take 500 mg by mouth every 6 (six) hours as needed for mild pain or moderate pain.   Yes Historical Provider, MD  alendronate (FOSAMAX) 70 MG tablet Take 70 mg by mouth once a week. Take with a full glass of water on an empty stomach. Monday   Yes Historical Provider, MD  Calcium-Vitamin D (CALTRATE 600 PLUS-VIT D PO) Take 1 each by mouth 2 (two) times daily.    Yes Historical Provider, MD  cholecalciferol (VITAMIN D) 1000 UNITS tablet Take 1,000 Units by mouth at bedtime.    Yes Historical Provider, MD  folic acid (FOLVITE) 235 MCG tablet Take 800 mcg by mouth daily.    Yes Historical Provider, MD  methotrexate (RHEUMATREX) 2.5 MG tablet Take 10 mg by mouth 2 (two) times a week. Caution:Chemotherapy. Protect from light.  Pt to takes 2.'5mg'$  tabs- 4 tablets on Friday and 4 tablets on Saturday   Yes Historical Provider, MD  Multiple  Vitamin (MULTIVITAMIN) tablet Take 1 tablet by mouth daily.   Yes Historical Provider, MD  predniSONE (DELTASONE) 5 MG tablet Take 5 mg by mouth daily as needed (flare up).    Yes Historical Provider, MD  raloxifene (EVISTA) 60 MG tablet Take 60 mg by mouth daily.   Yes Historical Provider, MD  albuterol (PROVENTIL) (2.5 MG/3ML) 0.083% nebulizer solution Take 3 mLs (2.5 mg total) by nebulization every 4 (four) hours as needed for wheezing. 02/14/15   Thurnell Lose, MD  bisacodyl (DULCOLAX) 5 MG EC tablet Take 2 tablets (10 mg total) by mouth daily as needed for moderate constipation. 02/14/15   Thurnell Lose, MD  feeding supplement, ENSURE ENLIVE, (ENSURE ENLIVE) LIQD Take 237 mLs by mouth 2 (two) times daily between meals. 02/14/15   Thurnell Lose, MD  ferrous sulfate 325 (65 FE) MG tablet Take 1 tablet (325 mg total) by mouth 2 (two) times daily with a meal. 02/14/15   Thurnell Lose, MD  LORazepam (ATIVAN) 2 MG/ML concentrated solution Take 0.5 mLs (1 mg total) by mouth every 6 (six) hours as needed for anxiety. 02/14/15   Thurnell Lose, MD  Morphine Sulfate (MORPHINE CONCENTRATE) 10 MG/0.5ML SOLN concentrated solution Take 0.5 mLs (10 mg total) by mouth every 3 (three) hours as needed for moderate pain or severe pain. 02/14/15   Thurnell Lose, MD  predniSONE (DELTASONE) 5 MG tablet Label  & dispense according to the schedule below. 10 Pills PO for 3 days then, 8 Pills PO for 3 days, 6 Pills PO for 3 days, 4 Pills PO for 3 days, 2 Pills PO for 3 days, 1 Pills PO for 3 days, 1/2 Pill  PO for 3 days then STOP. Total 95 pills. 02/14/15   Thurnell Lose, MD   Allergies  Allergen Reactions  . Codeine Other (See Comments)    Blurred vision and dizziness blurred vision, dizzy, loss of hearing  . Acyclovir And Related Swelling, Dermatitis and Rash    fatigue  . Other     TB test- arm swells/fever to extremity  Tegaderm badage- pulls skin off, rash  . Tuberculin Ppd Other (See Comments)     Swelling and redness in arm  . Tylox [Oxycodone-Acetaminophen] Other (See Comments)    Blurred vision and dizziness Blurred vision, dizziness    Review of Systems  Constitutional: Positive for activity change and fatigue.  HENT: Negative.   Eyes: Negative.   Respiratory: Positive for cough, chest tightness, shortness of breath and wheezing.   Cardiovascular: Negative for leg swelling.  Gastrointestinal: Negative.   Endocrine: Negative.   Genitourinary: Negative.   Musculoskeletal: Positive for myalgias.  Skin: Negative.   Allergic/Immunologic: Negative.   Neurological: Positive for weakness.  Hematological: Negative.   Psychiatric/Behavioral: Negative.     Physical Exam Very pleasant 70 yo female, sitting in recliner with oxygen in place. [place].  Sister at bedside. CV:  Tachycardic (tele at 122-123) Respirations:  On 4L oxygen with no increased work of breathing (talkative).  Port noted in right chest. Extremities:  Able to move all 4.  No edema.    Vital Signs: BP 107/58 mmHg  Pulse 99  Temp(Src) 97.8 F (36.6 C) (Axillary)  Resp 23  Ht '4\' 11"'$  (1.499 m)  Wt 55.2 kg (121 lb 11.1 oz)  BMI 24.57 kg/m2  SpO2 90%  SpO2: SpO2: 90 % O2 Device:SpO2: 90 % O2 Flow Rate: .O2 Flow Rate (L/min): 2 L/min  IO: Intake/output summary:   Intake/Output Summary (Last 24 hours) at 02/14/15 1644 Last data filed at 02/14/15 0731  Gross per 24 hour  Intake    360 ml  Output    825 ml  Net   -465 ml    LBM: Last BM Date: 02/11/15 Baseline Weight: Weight: 50.803 kg (112 lb) Most recent weight: Weight: 55.2 kg (121 lb 11.1 oz)      Palliative Assessment/Data:  Flowsheet Rows        Most Recent Value   Intake Tab    Referral Department  Hospitalist   Unit at Time of Referral  Intermediate Care Unit   Palliative Care Primary Diagnosis  Cancer  Date Notified  02/13/15   Palliative Care Type  New Palliative care   Reason for referral  Clarify Goals of Care, Advance Care  Planning   Date of Admission  02/06/15   Date first seen by Palliative Care  02/14/15   # of days IP prior to Palliative referral  7   Clinical Assessment    Palliative Performance Scale Score  60%   Psychosocial & Spiritual Assessment    Palliative Care Outcomes    Patient/Family meeting held?  Yes   Who was at the meeting?  Sister Alejandra Callahan, Patient   Palliative Care Outcomes  Counseled regarding hospice      Additional Data Reviewed:  CBC:    Component Value Date/Time   WBC 12.4* 02/14/2015 0330   HGB 8.0* 02/14/2015 0330   HCT 24.8* 02/14/2015 0330   PLT 242 02/14/2015 0330   MCV 104.6* 02/14/2015 0330   NEUTROABS 4.7 02/06/2015 1918   LYMPHSABS 1.5 02/06/2015 1918   MONOABS 0.9 02/06/2015 1918   EOSABS 0.7 02/06/2015 1918   BASOSABS 0.0 02/06/2015 1918   Comprehensive Metabolic Panel:    Component Value Date/Time   NA 135 02/14/2015 0330   K 4.2 02/14/2015 0330   CL 98* 02/14/2015 0330   CO2 30 02/14/2015 0330   BUN 5* 02/14/2015 0330   CREATININE 0.43* 02/14/2015 0330   GLUCOSE 130* 02/14/2015 0330   CALCIUM 8.7* 02/14/2015 0330   AST 24 02/10/2015 0524   ALT 16 02/10/2015 0524   ALKPHOS 65 02/10/2015 0524   BILITOT 0.3 02/10/2015 0524   PROT 4.5* 02/10/2015 0524   ALBUMIN 2.1* 02/10/2015 0524     Time In: 9:45 Time Out: 10:45 Time Total: 45 min. Greater than 50%  of this time was spent counseling and coordinating care related to the above assessment and plan.  Signed by: Imogene Burn, PA-C Palliative Medicine Pager: [phone]  02/14/2015, 4:44 PM  Please contact Palliative Medicine Team phone at [phone] for questions and concerns.

## 2015-02-15 ENCOUNTER — Encounter (HOSPITAL_COMMUNITY): Payer: Medicare Other | Attending: Hematology & Oncology

## 2015-02-15 ENCOUNTER — Other Ambulatory Visit (HOSPITAL_COMMUNITY): Payer: Self-pay | Admitting: *Deleted

## 2015-02-15 ENCOUNTER — Encounter (HOSPITAL_COMMUNITY): Payer: Medicare Other

## 2015-02-15 ENCOUNTER — Other Ambulatory Visit (HOSPITAL_COMMUNITY): Payer: Self-pay | Admitting: Hematology & Oncology

## 2015-02-15 VITALS — BP 110/55 | HR 110 | Temp 98.0°F | Resp 20 | Wt 121.2 lb

## 2015-02-15 DIAGNOSIS — Z79899 Other long term (current) drug therapy: Secondary | ICD-10-CM | POA: Insufficient documentation

## 2015-02-15 DIAGNOSIS — Z853 Personal history of malignant neoplasm of breast: Secondary | ICD-10-CM | POA: Diagnosis not present

## 2015-02-15 DIAGNOSIS — M069 Rheumatoid arthritis, unspecified: Secondary | ICD-10-CM | POA: Diagnosis not present

## 2015-02-15 DIAGNOSIS — C541 Malignant neoplasm of endometrium: Secondary | ICD-10-CM

## 2015-02-15 DIAGNOSIS — Z5111 Encounter for antineoplastic chemotherapy: Secondary | ICD-10-CM

## 2015-02-15 DIAGNOSIS — C7802 Secondary malignant neoplasm of left lung: Secondary | ICD-10-CM | POA: Diagnosis not present

## 2015-02-15 DIAGNOSIS — R918 Other nonspecific abnormal finding of lung field: Secondary | ICD-10-CM | POA: Diagnosis present

## 2015-02-15 LAB — PREPARE RBC (CROSSMATCH)

## 2015-02-15 LAB — ABO/RH: ABO/RH(D): O POS

## 2015-02-15 MED ORDER — PALONOSETRON HCL INJECTION 0.25 MG/5ML
0.2500 mg | Freq: Once | INTRAVENOUS | Status: AC
  Administered 2015-02-15: 0.25 mg via INTRAVENOUS

## 2015-02-15 MED ORDER — BENZONATATE 100 MG PO CAPS: 200.0000 mg | ORAL_CAPSULE | Freq: Three times a day (TID) | ORAL | Status: AC | PRN

## 2015-02-15 MED ORDER — DEXAMETHASONE SODIUM PHOSPHATE 100 MG/10ML IJ SOLN
10.0000 mg | Freq: Once | INTRAMUSCULAR | Status: AC
  Administered 2015-02-15: 10 mg via INTRAVENOUS
  Filled 2015-02-15: qty 1

## 2015-02-15 MED ORDER — SODIUM CHLORIDE 0.9% FLUSH
10.0000 mL | INTRAVENOUS | Status: DC | PRN
  Administered 2015-02-15: 10 mL
  Filled 2015-02-15: qty 10

## 2015-02-15 MED ORDER — DOXORUBICIN HCL LIPOSOMAL CHEMO INJECTION 2 MG/ML
20.0000 mg | Freq: Once | INTRAVENOUS | Status: AC
  Administered 2015-02-15: 20 mg via INTRAVENOUS
  Filled 2015-02-15: qty 10

## 2015-02-15 MED ORDER — PALONOSETRON HCL INJECTION 0.25 MG/5ML
INTRAVENOUS | Status: AC
  Filled 2015-02-15: qty 5

## 2015-02-15 MED ORDER — SODIUM CHLORIDE 0.9 % IV SOLN
Freq: Once | INTRAVENOUS | Status: AC
  Administered 2015-02-15: 09:00:00 via INTRAVENOUS

## 2015-02-15 MED ORDER — HEPARIN SOD (PORK) LOCK FLUSH 100 UNIT/ML IV SOLN
500.0000 [IU] | Freq: Once | INTRAVENOUS | Status: AC | PRN
  Administered 2015-02-15: 500 [IU]

## 2015-02-15 MED ORDER — DOXORUBICIN HCL LIPOSOMAL CHEMO INJECTION 2 MG/ML
15.0000 mg/m2 | Freq: Once | INTRAVENOUS | Status: DC
  Filled 2015-02-15: qty 11

## 2015-02-15 MED ORDER — HEPARIN SOD (PORK) LOCK FLUSH 100 UNIT/ML IV SOLN
INTRAVENOUS | Status: AC
Start: 2015-02-15 — End: 2015-02-15
  Filled 2015-02-15: qty 5

## 2015-02-15 NOTE — Progress Notes (Signed)
Patient tolerated infusion well.  VSS.  Order for tessalon pearls and portable oxygen obtained for the patient to have at home.

## 2015-02-15 NOTE — Patient Instructions (Addendum)
Alejandra Callahan   CHEMOTHERAPY INSTRUCTIONS  Premeds - Aloxi - for nausea/vomiting prevention/reduction  Dexamethasone - steroid - given to reduce nausea/vomiting also. Dex can cause you to feel energized, nervous/anxious/jittery, make you have trouble sleeping, and/or make you feel hot/flushed in the face/neck and/or look pink/red in the face/neck. These side effects will pass as the Dex wears off. (takes 20 minutes to infuse)  Doxil - bone marrow suppression (lowers white blood cells (fight infection), lowers red blood cells (make up your blood), lowers platelets (help blood to clot). Nausea/Vomiting, Cardiotoxicity (can weaken the left ventricle - pumping muscle of the heart) therefore we will have to do MUGA scans to see if your heart muscle is weakening prior to treatment as well as periodically during treatment with Doxil, Arrhythmia, hyperuricemia (increase uric acid levels in blood), radiation recall (turn the area red where you previously had radiation), hand-foot syndrome (your palms of hands and soles of feet can get bright red, burn, peel, blister, hurt) - avoid friction to your hands/feet, lotion them twice a day, avoid warm/hot temps to your hands/feet, do not apply Vaseline to your hands or feet if they are burning. Vaseline/petroleum traps heat. Avoid hot showers. This medication can make your eyes sensitive to sunlight. Wear sunglasses.  This drug may turn your urine pink/red for a few voids after receiving it. Your urine should begin to turn back to a yellow or light yellow color after a few voids.    POTENTIAL SIDE EFFECTS OF TREATMENT: Increased Susceptibility to Infection, Vomiting, Constipation, Hair Thinning, Changes in Character of Skin and Nails (brittleness, dryness,etc.), Bone Marrow Suppression, Nausea, Diarrhea, Sun Sensitivity and Mouth Sores    EDUCATIONAL MATERIALS GIVEN AND REVIEWED: Specific Instructions Sheets: Doxil   SELF CARE  ACTIVITIES WHILE ON CHEMOTHERAPY: Increase your fluid intake 48 hours prior to treatment and drink at least 2 quarts per day after treatment., No alcohol intake., No aspirin or other medications unless approved by your oncologist., Eat foods that are light and easy to digest., Eat foods at cold or room temperature., No fried, fatty, or spicy foods immediately before or after treatment., Have teeth cleaned professionally before starting treatment. Keep dentures and partial plates clean., Use soft toothbrush and do not use mouthwashes that contain alcohol. Biotene is a good mouthwash that is available at most pharmacies or may be ordered by calling [phone]., Use warm salt water gargles (1 teaspoon salt per 1 quart warm water) before and after meals and at bedtime. Or you may rinse with 2 tablespoons of three -percent hydrogen peroxide mixed in eight ounces of water., Always use sunscreen with SPF (Sun Protection Factor) of 30 or higher., Use your nausea medication as directed to prevent nausea., Use your stool softener or laxative as directed to prevent constipation. and Use your anti-diarrheal medication as directed to stop diarrhea.  Please wash your hands for at least 30 seconds using warm soapy water. Handwashing is the #1 way to prevent the spread of germs. Stay away from sick people or people who are getting over a cold. If you develop respiratory systems such as green/yellow mucus production or productive cough or persistent cough let us know and we will see if you need an antibiotic. It is a good idea to keep a pair of gloves on when going into grocery stores/[employer] to decrease your risk of coming into contact with germs on the carts, etc. Carry alcohol hand gel with you at all times and use  it frequently if out in public. All foods need to be cooked thoroughly. No raw foods. No medium or undercooked meats, eggs. If your food is cooked medium well, it does not need to be hot pink or saturated with  bloody liquid at all. Vegetables and fruits need to be washed/rinsed under the faucet with a dish detergent before being consumed. You can eat raw fruits and vegetables unless we tell you otherwise but it would be best if you cooked them or bought frozen. Do not eat off of salad bars or hot bars unless you really trust the cleanliness of the restaurant. If you need dental work, please let Dr. Whitney Muse know before you go for your appointment so that we can coordinate the best possible time for you in regards to your chemo regimen. You need to also let your dentist know that you are actively taking chemo. We may need to do labs prior to your dental appointment. We also want your bowels moving at least every other day. If this is not happening, we need to know so that we can get you on a bowel regimen to help you go.       MEDICATIONS: You have been given prescriptions for the following medications:  Ondansetron/Zofran 78m tablet. Take 1 tablet every 8 hours as needed for nausea/vomiting.   Prochlorperazine/Compazine 122mtablet. Take 1 tablet every 6 hours as needed for nausea/vomiting.     SYMPTOMS TO REPORT AS SOON AS POSSIBLE AFTER TREATMENT:  FEVER GREATER THAN 100.5 F  CHILLS WITH OR WITHOUT FEVER  NAUSEA AND VOMITING THAT IS NOT CONTROLLED WITH YOUR NAUSEA MEDICATION  UNUSUAL SHORTNESS OF BREATH  UNUSUAL BRUISING OR BLEEDING  TENDERNESS IN MOUTH AND THROAT WITH OR WITHOUT PRESENCE OF ULCERS  URINARY PROBLEMS  BOWEL PROBLEMS  UNUSUAL RASH    Wear comfortable clothing and clothing appropriate for easy access to any Portacath or PICC line. Let usKoreanow if there is anything that we can do to make your therapy better!      I have been informed and understand all of the instructions given to me and have received a copy. I have been instructed to call the clinic [phone] my family physician as soon as possible for continued medical care, if indicated. I do not have any  more questions at this time but understand that I may call the [facility] the Patient Navigator at ([phone] office hours should I have questions or need assistance in obtaining follow-up care.           Doxorubicin Liposomal injection What is this medicine? LIPOSOMAL DOXORUBICIN (LIP oh som al dox oh ROO bi sin) is a chemotherapy drug. This medicine is used to treat many kinds of cancer like Kaposi's sarcoma, multiple myeloma, and ovarian cancer. This medicine may be used for other purposes; ask your health care provider or pharmacist if you have questions. What should I tell my health care provider before I take this medicine? They need to know if you have any of these conditions: -blood disorders -heart disease -infection (especially a virus infection such as chickenpox, cold sores, or herpes) -liver disease -recent or ongoing radiation therapy -an unusual or allergic reaction to doxorubicin, other chemotherapy agents, soybeans, other medicines, foods, dyes, or preservatives -pregnant or trying to get pregnant -breast-feeding How should I use this medicine? This drug is given as an infusion into a vein. It is administered in a hospital or clinic by a specially trained  health care professional. If you have pain, swelling, burning or any unusual feeling around the site of your injection, tell your health care professional right away. Talk to your pediatrician regarding the use of this medicine in children. Special care may be needed. Overdosage: If you think you have taken too much of this medicine contact a poison control center or emergency room at once. NOTE: This medicine is only for you. Do not share this medicine with others. What if I miss a dose? It is important not to miss your dose. Call your doctor or health care professional if you are unable to keep an appointment. What may interact with this medicine? Do not take this medicine with any of the  following medications: -zidovudine This medicine may also interact with the following medications: -medicines to increase blood counts like filgrastim, pegfilgrastim, sargramostim -vaccines Talk to your doctor or health care professional before taking any of these medicines: -acetaminophen -aspirin -ibuprofen -ketoprofen -naproxen This list may not describe all possible interactions. Give your health care provider a list of all the medicines, herbs, non-prescription drugs, or dietary supplements you use. Also tell them if you smoke, drink alcohol, or use illegal drugs. Some items may interact with your medicine. What should I watch for while using this medicine? Your condition will be monitored carefully while you are receiving this medicine. You will need important blood work done while you are taking this medicine. This drug may make you feel generally unwell. This is not uncommon, as chemotherapy can affect healthy cells as well as cancer cells. Report any side effects. Continue your course of treatment even though you feel ill unless your doctor tells you to stop. Your urine may turn orange-red for a few days after your dose. This is not blood. If your urine is dark or brown, call your doctor. In some cases, you may be given additional medicines to help with side effects. Follow all directions for their use. Call your doctor or health care professional for advice if you get a fever (100.5 degrees F or higher), chills or sore throat, or other symptoms of a cold or flu. Do not treat yourself. This drug decreases your body's ability to fight infections. Try to avoid being around people who are sick. This medicine may increase your risk to bruise or bleed. Call your doctor or health care professional if you notice any unusual bleeding. Be careful brushing and flossing your teeth or using a toothpick because you may get an infection or bleed more easily. If you have any dental work done, tell your  dentist you are receiving this medicine. Avoid taking products that contain aspirin, acetaminophen, ibuprofen, naproxen, or ketoprofen unless instructed by your doctor. These medicines may hide a fever. Men and women of childbearing age should use effective birth control methods while using taking this medicine. Do not become pregnant while taking this medicine. There is a potential for serious side effects to an unborn child. Talk to your health care professional or pharmacist for more information. Do not breast-feed an infant while taking this medicine. Talk to your doctor about your risk of cancer. You may be more at risk for certain types of cancers if you take this medicine. What side effects may I notice from receiving this medicine? Side effects that you should report to your doctor or health care professional as soon as possible: -allergic reactions like skin rash, itching or hives, swelling of the face, lips, or tongue -low blood counts - this medicine  may decrease the number of white blood cells, red blood cells and platelets. You may be at increased risk for infections and bleeding. -signs of hand-foot syndrome - tingling or burning, redness, flaking, swelling, small blisters, or small sores on the palms of your hands or the soles of your feet -signs of infection - fever or chills, cough, sore throat, pain or difficulty passing urine -signs of decreased platelets or bleeding - bruising, pinpoint red spots on the skin, black, tarry stools, blood in the urine -signs of decreased red blood cells - unusually weak or tired, fainting spells, lightheadedness -back pain, chills, facial flushing, fever, headache, tightness in the chest or throat during the infusion -breathing problems -chest pain -fast, irregular heartbeat -mouth pain, redness, sores -pain, swelling, redness at site where injected -pain, tingling, numbness in the hands or feet -swelling of ankles, feet, or hands -vomiting Side  effects that usually do not require medical attention (report to your doctor or health care professional if they continue or are bothersome): -diarrhea -hair loss -loss of appetite -nail discoloration or damage -nausea -red or watery eyes -red colored urine -stomach upset This list may not describe all possible side effects. Call your doctor for medical advice about side effects. You may report side effects to FDA at 1-800-FDA-1088. Where should I keep my medicine? This drug is given in a hospital or clinic and will not be stored at home. NOTE: This sheet is a summary. It may not cover all possible information. If you have questions about this medicine, talk to your doctor, pharmacist, or health care provider.    2016, [employer]. (2011-09-11 10:12:56)

## 2015-02-15 NOTE — Progress Notes (Unsigned)
Chemo teaching done with family and consent signed for Doxil.

## 2015-02-15 NOTE — Patient Instructions (Signed)
[facility] Discharge Instructions for Patients Receiving Chemotherapy   Beginning January 23rd 2017 lab work for the [facility] will be done in the  Main lab at [facility] on 1st floor. If you have a lab appointment with the [facility] please come in thru the  Main Entrance and check in at the main information desk   Today you received the following chemotherapy: Doxil.     If you develop nausea and vomiting, or diarrhea that is not controlled by your medication, call the clinic.  The clinic phone number is [phone]. Office hours are Monday-Friday 8:30am-5:00pm.  BELOW ARE SYMPTOMS THAT SHOULD BE REPORTED IMMEDIATELY:  *FEVER GREATER THAN 101.0 F  *CHILLS WITH OR WITHOUT FEVER  NAUSEA AND VOMITING THAT IS NOT CONTROLLED WITH YOUR NAUSEA MEDICATION  *UNUSUAL SHORTNESS OF BREATH  *UNUSUAL BRUISING OR BLEEDING  TENDERNESS IN MOUTH AND THROAT WITH OR WITHOUT PRESENCE OF ULCERS  *URINARY PROBLEMS  *BOWEL PROBLEMS  UNUSUAL RASH Items with * indicate a potential emergency and should be followed up as soon as possible. If you have an emergency after office hours please contact your primary care physician or go to the nearest emergency department.  Please call the clinic during office hours if you have any questions or concerns.   You may also contact the Patient Navigator at [phone] should you have any questions or need assistance in obtaining follow up care.

## 2015-02-18 ENCOUNTER — Encounter (HOSPITAL_BASED_OUTPATIENT_CLINIC_OR_DEPARTMENT_OTHER): Payer: Medicare Other

## 2015-02-18 DIAGNOSIS — C541 Malignant neoplasm of endometrium: Secondary | ICD-10-CM

## 2015-02-18 LAB — CBC WITH DIFFERENTIAL/PLATELET
Basophils Absolute: 0 10*3/uL (ref 0.0–0.1)
Basophils Relative: 0 %
EOS PCT: 1 %
Eosinophils Absolute: 0.1 10*3/uL (ref 0.0–0.7)
HCT: 26.2 % — ABNORMAL LOW (ref 36.0–46.0)
Hemoglobin: 8.8 g/dL — ABNORMAL LOW (ref 12.0–15.0)
LYMPHS ABS: 1.4 10*3/uL (ref 0.7–4.0)
LYMPHS PCT: 11 %
MCH: 35.6 pg — AB (ref 26.0–34.0)
MCHC: 33.6 g/dL (ref 30.0–36.0)
MCV: 106.1 fL — AB (ref 78.0–100.0)
MONO ABS: 0.9 10*3/uL (ref 0.1–1.0)
MONOS PCT: 7 %
Neutro Abs: 10.5 10*3/uL — ABNORMAL HIGH (ref 1.7–7.7)
Neutrophils Relative %: 81 %
PLATELETS: 321 10*3/uL (ref 150–400)
RBC: 2.47 MIL/uL — AB (ref 3.87–5.11)
RDW: 16.4 % — AB (ref 11.5–15.5)
WBC: 12.9 10*3/uL — ABNORMAL HIGH (ref 4.0–10.5)

## 2015-02-18 LAB — COMPREHENSIVE METABOLIC PANEL
ALT: 22 U/L (ref 14–54)
ANION GAP: 6 (ref 5–15)
AST: 28 U/L (ref 15–41)
Albumin: 2.4 g/dL — ABNORMAL LOW (ref 3.5–5.0)
Alkaline Phosphatase: 81 U/L (ref 38–126)
BUN: 13 mg/dL (ref 6–20)
CHLORIDE: 96 mmol/L — AB (ref 101–111)
CO2: 29 mmol/L (ref 22–32)
CREATININE: 0.4 mg/dL — AB (ref 0.44–1.00)
Calcium: 8.6 mg/dL — ABNORMAL LOW (ref 8.9–10.3)
Glucose, Bld: 175 mg/dL — ABNORMAL HIGH (ref 65–99)
Potassium: 4.1 mmol/L (ref 3.5–5.1)
Sodium: 131 mmol/L — ABNORMAL LOW (ref 135–145)
Total Bilirubin: 0.3 mg/dL (ref 0.3–1.2)
Total Protein: 5.3 g/dL — ABNORMAL LOW (ref 6.5–8.1)

## 2015-02-18 MED ORDER — HEPARIN SOD (PORK) LOCK FLUSH 100 UNIT/ML IV SOLN
INTRAVENOUS | Status: AC
  Filled 2015-02-18: qty 5

## 2015-02-18 MED ORDER — SODIUM CHLORIDE 0.9% FLUSH
10.0000 mL | INTRAVENOUS | Status: AC | PRN
  Administered 2015-02-18: 10 mL

## 2015-02-18 MED ORDER — SODIUM CHLORIDE 0.9 % IV SOLN
250.0000 mL | Freq: Once | INTRAVENOUS | Status: AC
  Administered 2015-02-18: 250 mL via INTRAVENOUS

## 2015-02-18 MED ORDER — HEPARIN SOD (PORK) LOCK FLUSH 100 UNIT/ML IV SOLN
500.0000 [IU] | Freq: Every day | INTRAVENOUS | Status: AC | PRN
  Administered 2015-02-18: 500 [IU]

## 2015-02-18 MED ORDER — DIPHENHYDRAMINE HCL 25 MG PO CAPS
25.0000 mg | ORAL_CAPSULE | Freq: Once | ORAL | Status: AC
  Administered 2015-02-18: 25 mg via ORAL
  Filled 2015-02-18: qty 1

## 2015-02-18 MED ORDER — ACETAMINOPHEN 325 MG PO TABS
650.0000 mg | ORAL_TABLET | Freq: Once | ORAL | Status: AC
  Administered 2015-02-18: 650 mg via ORAL
  Filled 2015-02-18: qty 2

## 2015-02-18 NOTE — Patient Instructions (Signed)
[facility] Discharge Instructions  RECOMMENDATIONS MADE BY THE CONSULTANT AND ANY TEST RESULTS WILL BE SENT TO YOUR REFERRING PHYSICIAN.  2 units packed red blood cells given today as ordered. Return as scheduled.  Thank you for choosing [facility] at [facility] to provide your oncology and hematology care.  To afford each patient quality time with our provider, please arrive at least 15 minutes before your scheduled appointment time.   Beginning January 23rd 2017 lab work for the [facility] will be done in the  Main lab at [facility] on 1st floor. If you have a lab appointment with the [facility] please come in thru the  Main Entrance and check in at the main information desk  You need to re-schedule your appointment should you arrive 10 or more minutes late.  We strive to give you quality time with our providers, and arriving late affects you and other patients whose appointments are after yours.  Also, if you no show three or more times for appointments you may be dismissed from the clinic at the providers discretion.     Again, thank you for choosing [facility].  Our hope is that these requests will decrease the amount of time that you wait before being seen by our physicians.       _____________________________________________________________  Should you have questions after your visit to [facility], please contact our office at [phone] between the hours of 8:30 a.m. and 4:30 p.m.  Voicemails left after 4:30 p.m. will not be returned until the following business day.  For prescription refill requests, have your pharmacy contact our office.

## 2015-02-18 NOTE — Anesthesia Postprocedure Evaluation (Signed)
Anesthesia Post Note  Patient: Alejandra Callahan  Procedure(s) Performed: Procedure(s) (LRB): VIDEO ASSISTED THORACOSCOPY, lung biopsy (Left) DRAINAGE OF PLEURAL EFFUSION (Left)  Patient location during evaluation: PACU Anesthesia Type: General Level of consciousness: awake and alert Pain management: pain level controlled Vital Signs Assessment: post-procedure vital signs reviewed and stable Respiratory status: spontaneous breathing, nonlabored ventilation, respiratory function stable and patient connected to nasal cannula oxygen Cardiovascular status: blood pressure returned to baseline and stable Postop Assessment: no signs of nausea or vomiting Anesthetic complications: no    Last Vitals:  Filed Vitals:   02/14/15 0755 02/14/15 1414  BP: 107/58   Pulse: 99   Temp:  36.6 C  Resp: 23     Last Pain:  Filed Vitals:   02/14/15 1417  PainSc: 2                  Effie Berkshire

## 2015-02-18 NOTE — Progress Notes (Signed)
Tolerated blood transfusion well. Stable on discharge home with family via wheelchair.

## 2015-02-19 ENCOUNTER — Ambulatory Visit: Payer: Medicare Other | Admitting: Thoracic Surgery (Cardiothoracic Vascular Surgery)

## 2015-02-19 ENCOUNTER — Ambulatory Visit (HOSPITAL_COMMUNITY): Payer: Medicare Other | Admitting: Oncology

## 2015-02-19 LAB — TYPE AND SCREEN
ABO/RH(D): O POS
ANTIBODY SCREEN: NEGATIVE
UNIT DIVISION: 0
Unit division: 0

## 2015-02-21 ENCOUNTER — Encounter (HOSPITAL_BASED_OUTPATIENT_CLINIC_OR_DEPARTMENT_OTHER): Payer: Medicare Other

## 2015-02-21 VITALS — BP 103/56 | HR 103 | Temp 98.2°F | Resp 18 | Wt 110.0 lb

## 2015-02-21 DIAGNOSIS — C7802 Secondary malignant neoplasm of left lung: Secondary | ICD-10-CM

## 2015-02-21 DIAGNOSIS — Z5111 Encounter for antineoplastic chemotherapy: Secondary | ICD-10-CM

## 2015-02-21 DIAGNOSIS — C541 Malignant neoplasm of endometrium: Secondary | ICD-10-CM | POA: Diagnosis not present

## 2015-02-21 LAB — CBC WITH DIFFERENTIAL/PLATELET
BASOS PCT: 0 %
Basophils Absolute: 0 10*3/uL (ref 0.0–0.1)
EOS ABS: 0.1 10*3/uL (ref 0.0–0.7)
Eosinophils Relative: 2 %
HEMATOCRIT: 34.4 % — AB (ref 36.0–46.0)
HEMOGLOBIN: 11.6 g/dL — AB (ref 12.0–15.0)
LYMPHS ABS: 1 10*3/uL (ref 0.7–4.0)
Lymphocytes Relative: 13 %
MCH: 33.7 pg (ref 26.0–34.0)
MCHC: 33.7 g/dL (ref 30.0–36.0)
MCV: 100 fL (ref 78.0–100.0)
MONOS PCT: 10 %
Monocytes Absolute: 0.8 10*3/uL (ref 0.1–1.0)
NEUTROS ABS: 6 10*3/uL (ref 1.7–7.7)
NEUTROS PCT: 75 %
Platelets: 260 10*3/uL (ref 150–400)
RBC: 3.44 MIL/uL — AB (ref 3.87–5.11)
RDW: 17.2 % — ABNORMAL HIGH (ref 11.5–15.5)
WBC: 8 10*3/uL (ref 4.0–10.5)

## 2015-02-21 LAB — COMPREHENSIVE METABOLIC PANEL
ALBUMIN: 2.6 g/dL — AB (ref 3.5–5.0)
ALK PHOS: 102 U/L (ref 38–126)
ALT: 19 U/L (ref 14–54)
AST: 31 U/L (ref 15–41)
Anion gap: 8 (ref 5–15)
BILIRUBIN TOTAL: 0.5 mg/dL (ref 0.3–1.2)
BUN: 13 mg/dL (ref 6–20)
CALCIUM: 9.1 mg/dL (ref 8.9–10.3)
CO2: 27 mmol/L (ref 22–32)
CREATININE: 0.5 mg/dL (ref 0.44–1.00)
Chloride: 96 mmol/L — ABNORMAL LOW (ref 101–111)
GFR calc Af Amer: >60 mL/min (ref >60)
GFR calc non Af Amer: >60 mL/min (ref >60)
GLUCOSE: 189 mg/dL — AB (ref 65–99)
Potassium: 4.2 mmol/L (ref 3.5–5.1)
SODIUM: 131 mmol/L — AB (ref 135–145)
TOTAL PROTEIN: 5.8 g/dL — AB (ref 6.5–8.1)

## 2015-02-21 MED ORDER — DOXORUBICIN HCL LIPOSOMAL CHEMO INJECTION 2 MG/ML
20.0000 mg | Freq: Once | INTRAVENOUS | Status: AC
  Administered 2015-02-21: 20 mg via INTRAVENOUS
  Filled 2015-02-21: qty 10

## 2015-02-21 MED ORDER — PALONOSETRON HCL INJECTION 0.25 MG/5ML
0.2500 mg | Freq: Once | INTRAVENOUS | Status: AC
  Administered 2015-02-21: 0.25 mg via INTRAVENOUS

## 2015-02-21 MED ORDER — PALONOSETRON HCL INJECTION 0.25 MG/5ML
INTRAVENOUS | Status: AC
  Filled 2015-02-21: qty 5

## 2015-02-21 MED ORDER — HEPARIN SOD (PORK) LOCK FLUSH 100 UNIT/ML IV SOLN
500.0000 [IU] | Freq: Once | INTRAVENOUS | Status: AC
  Administered 2015-02-21: 500 [IU] via INTRAVENOUS

## 2015-02-21 MED ORDER — SODIUM CHLORIDE 0.9 % IV SOLN
Freq: Once | INTRAVENOUS | Status: AC
  Administered 2015-02-21: 11:00:00 via INTRAVENOUS

## 2015-02-21 MED ORDER — SODIUM CHLORIDE 0.9 % IV SOLN
10.0000 mg | Freq: Once | INTRAVENOUS | Status: AC
  Administered 2015-02-21: 10 mg via INTRAVENOUS
  Filled 2015-02-21: qty 1

## 2015-02-21 NOTE — Progress Notes (Signed)
Patient tolerated infusion well. VSS.  Patient reported that she had "runny watery" output from her rectum this past weekend as well as earlier this week and it has stopped over the last few days.  She and her family were educated twice that this was diarrhea and that it needed to be treated per diarrhea protocol sheet that she has in her folder.  The patient stated that she didn't think it was diarrhea because it was just water, but that if it happened again she understood that it needed to be treated.  I encouraged her to call with any questions or concerns so that we could help her treat this and any other symptom she may be having.  Her and her family agreed.

## 2015-02-21 NOTE — Patient Instructions (Signed)
[facility] Discharge Instructions for Patients Receiving Chemotherapy   Beginning January 23rd 2017 lab work for the [facility] will be done in the  Main lab at [facility] on 1st floor. If you have a lab appointment with the [facility] please come in thru the  Main Entrance and check in at the main information desk   Today you received the following chemotherapy agent: Doxil.   Please continue to hydrate well to protect your kidneys from this chemotherapy.     If you develop nausea and vomiting, or diarrhea that is not controlled by your medication, call the clinic.  The clinic phone number is [phone]. Office hours are Monday-Friday 8:30am-5:00pm.  BELOW ARE SYMPTOMS THAT SHOULD BE REPORTED IMMEDIATELY:  *FEVER GREATER THAN 101.0 F  *CHILLS WITH OR WITHOUT FEVER  NAUSEA AND VOMITING THAT IS NOT CONTROLLED WITH YOUR NAUSEA MEDICATION  *UNUSUAL SHORTNESS OF BREATH  *UNUSUAL BRUISING OR BLEEDING  TENDERNESS IN MOUTH AND THROAT WITH OR WITHOUT PRESENCE OF ULCERS  *URINARY PROBLEMS  *BOWEL PROBLEMS  UNUSUAL RASH Items with * indicate a potential emergency and should be followed up as soon as possible. If you have an emergency after office hours please contact your primary care physician or go to the nearest emergency department.  Please call the clinic during office hours if you have any questions or concerns.   You may also contact the Patient Navigator at [phone] should you have any questions or need assistance in obtaining follow up care.

## 2015-02-22 ENCOUNTER — Other Ambulatory Visit: Payer: Self-pay | Admitting: Thoracic Surgery (Cardiothoracic Vascular Surgery)

## 2015-02-22 DIAGNOSIS — J90 Pleural effusion, not elsewhere classified: Secondary | ICD-10-CM

## 2015-02-26 ENCOUNTER — Emergency Department (HOSPITAL_COMMUNITY): Payer: Medicare Other

## 2015-02-26 ENCOUNTER — Other Ambulatory Visit: Payer: Self-pay | Admitting: *Deleted

## 2015-02-26 ENCOUNTER — Telehealth (HOSPITAL_COMMUNITY): Payer: Self-pay | Admitting: Emergency Medicine

## 2015-02-26 ENCOUNTER — Other Ambulatory Visit (HOSPITAL_COMMUNITY): Payer: Self-pay | Admitting: *Deleted

## 2015-02-26 ENCOUNTER — Encounter: Payer: Self-pay | Admitting: Thoracic Surgery (Cardiothoracic Vascular Surgery)

## 2015-02-26 ENCOUNTER — Encounter (HOSPITAL_BASED_OUTPATIENT_CLINIC_OR_DEPARTMENT_OTHER): Payer: Medicare Other

## 2015-02-26 ENCOUNTER — Inpatient Hospital Stay (HOSPITAL_COMMUNITY)
Admission: EM | Admit: 2015-02-26 | Discharge: 2015-03-06 | DRG: 871 | Disposition: E | Payer: Medicare Other | Attending: Internal Medicine | Admitting: Internal Medicine

## 2015-02-26 ENCOUNTER — Ambulatory Visit (INDEPENDENT_AMBULATORY_CARE_PROVIDER_SITE_OTHER): Payer: Self-pay | Admitting: Thoracic Surgery (Cardiothoracic Vascular Surgery)

## 2015-02-26 ENCOUNTER — Ambulatory Visit
Admission: RE | Admit: 2015-02-26 | Discharge: 2015-02-26 | Disposition: A | Discharge status: Home / Self Care | Payer: Medicare Other | Source: Ambulatory Visit | Attending: Thoracic Surgery (Cardiothoracic Vascular Surgery) | Admitting: Thoracic Surgery (Cardiothoracic Vascular Surgery)

## 2015-02-26 ENCOUNTER — Encounter (HOSPITAL_COMMUNITY): Payer: Self-pay | Admitting: *Deleted

## 2015-02-26 VITALS — BP 128/74 | HR 130 | Temp 98.6°F | Resp 20 | Ht 59.0 in | Wt 110.0 lb

## 2015-02-26 DIAGNOSIS — Z66 Do not resuscitate: Secondary | ICD-10-CM | POA: Diagnosis not present

## 2015-02-26 DIAGNOSIS — C7800 Secondary malignant neoplasm of unspecified lung: Secondary | ICD-10-CM | POA: Diagnosis present

## 2015-02-26 DIAGNOSIS — Z800 Family history of malignant neoplasm of digestive organs: Secondary | ICD-10-CM

## 2015-02-26 DIAGNOSIS — R34 Anuria and oliguria: Secondary | ICD-10-CM | POA: Diagnosis present

## 2015-02-26 DIAGNOSIS — A414 Sepsis due to anaerobes: Principal | ICD-10-CM | POA: Diagnosis present

## 2015-02-26 DIAGNOSIS — R079 Chest pain, unspecified: Secondary | ICD-10-CM

## 2015-02-26 DIAGNOSIS — J910 Malignant pleural effusion: Secondary | ICD-10-CM | POA: Diagnosis present

## 2015-02-26 DIAGNOSIS — Z515 Encounter for palliative care: Secondary | ICD-10-CM | POA: Diagnosis not present

## 2015-02-26 DIAGNOSIS — R1032 Left lower quadrant pain: Secondary | ICD-10-CM | POA: Diagnosis not present

## 2015-02-26 DIAGNOSIS — J90 Pleural effusion, not elsewhere classified: Secondary | ICD-10-CM

## 2015-02-26 DIAGNOSIS — J9621 Acute and chronic respiratory failure with hypoxia: Secondary | ICD-10-CM | POA: Diagnosis present

## 2015-02-26 DIAGNOSIS — Z853 Personal history of malignant neoplasm of breast: Secondary | ICD-10-CM | POA: Diagnosis not present

## 2015-02-26 DIAGNOSIS — Z7952 Long term (current) use of systemic steroids: Secondary | ICD-10-CM

## 2015-02-26 DIAGNOSIS — C7801 Secondary malignant neoplasm of right lung: Secondary | ICD-10-CM | POA: Diagnosis present

## 2015-02-26 DIAGNOSIS — Z79899 Other long term (current) drug therapy: Secondary | ICD-10-CM | POA: Diagnosis not present

## 2015-02-26 DIAGNOSIS — Z885 Allergy status to narcotic agent status: Secondary | ICD-10-CM

## 2015-02-26 DIAGNOSIS — Z9012 Acquired absence of left breast and nipple: Secondary | ICD-10-CM

## 2015-02-26 DIAGNOSIS — C7802 Secondary malignant neoplasm of left lung: Secondary | ICD-10-CM | POA: Diagnosis present

## 2015-02-26 DIAGNOSIS — Z7401 Bed confinement status: Secondary | ICD-10-CM

## 2015-02-26 DIAGNOSIS — E860 Dehydration: Secondary | ICD-10-CM | POA: Diagnosis present

## 2015-02-26 DIAGNOSIS — R195 Other fecal abnormalities: Secondary | ICD-10-CM | POA: Insufficient documentation

## 2015-02-26 DIAGNOSIS — M069 Rheumatoid arthritis, unspecified: Secondary | ICD-10-CM | POA: Diagnosis present

## 2015-02-26 DIAGNOSIS — R071 Chest pain on breathing: Secondary | ICD-10-CM

## 2015-02-26 DIAGNOSIS — Z8542 Personal history of malignant neoplasm of other parts of uterus: Secondary | ICD-10-CM

## 2015-02-26 DIAGNOSIS — D6489 Other specified anemias: Secondary | ICD-10-CM | POA: Diagnosis present

## 2015-02-26 DIAGNOSIS — C541 Malignant neoplasm of endometrium: Secondary | ICD-10-CM

## 2015-02-26 DIAGNOSIS — A419 Sepsis, unspecified organism: Secondary | ICD-10-CM

## 2015-02-26 DIAGNOSIS — R918 Other nonspecific abnormal finding of lung field: Secondary | ICD-10-CM

## 2015-02-26 DIAGNOSIS — R0602 Shortness of breath: Secondary | ICD-10-CM

## 2015-02-26 DIAGNOSIS — Z9889 Other specified postprocedural states: Secondary | ICD-10-CM

## 2015-02-26 DIAGNOSIS — T451X5A Adverse effect of antineoplastic and immunosuppressive drugs, initial encounter: Secondary | ICD-10-CM | POA: Diagnosis present

## 2015-02-26 DIAGNOSIS — J948 Other specified pleural conditions: Secondary | ICD-10-CM

## 2015-02-26 DIAGNOSIS — Z8249 Family history of ischemic heart disease and other diseases of the circulatory system: Secondary | ICD-10-CM

## 2015-02-26 DIAGNOSIS — K529 Noninfective gastroenteritis and colitis, unspecified: Secondary | ICD-10-CM | POA: Diagnosis present

## 2015-02-26 DIAGNOSIS — Z803 Family history of malignant neoplasm of breast: Secondary | ICD-10-CM

## 2015-02-26 DIAGNOSIS — E871 Hypo-osmolality and hyponatremia: Secondary | ICD-10-CM | POA: Diagnosis present

## 2015-02-26 DIAGNOSIS — R6510 Systemic inflammatory response syndrome (SIRS) of non-infectious origin without acute organ dysfunction: Secondary | ICD-10-CM | POA: Insufficient documentation

## 2015-02-26 DIAGNOSIS — J188 Other pneumonia, unspecified organism: Secondary | ICD-10-CM | POA: Diagnosis present

## 2015-02-26 DIAGNOSIS — E876 Hypokalemia: Secondary | ICD-10-CM | POA: Diagnosis present

## 2015-02-26 DIAGNOSIS — Z79811 Long term (current) use of aromatase inhibitors: Secondary | ICD-10-CM

## 2015-02-26 DIAGNOSIS — D6481 Anemia due to antineoplastic chemotherapy: Secondary | ICD-10-CM | POA: Diagnosis present

## 2015-02-26 DIAGNOSIS — E440 Moderate protein-calorie malnutrition: Secondary | ICD-10-CM | POA: Diagnosis present

## 2015-02-26 DIAGNOSIS — A047 Enterocolitis due to Clostridium difficile: Secondary | ICD-10-CM | POA: Diagnosis present

## 2015-02-26 LAB — LIPASE, BLOOD: LIPASE: 16 U/L (ref 11–51)

## 2015-02-26 LAB — CBC WITH DIFFERENTIAL/PLATELET
BASOS ABS: 0 10*3/uL (ref 0.0–0.1)
BASOS ABS: 0 10*3/uL (ref 0.0–0.1)
BASOS PCT: 0 %
BASOS PCT: 0 %
EOS PCT: 0 %
Eosinophils Absolute: 0 10*3/uL (ref 0.0–0.7)
Eosinophils Absolute: 0 10*3/uL (ref 0.0–0.7)
Eosinophils Relative: 0 %
HCT: 34.5 % — ABNORMAL LOW (ref 36.0–46.0)
HEMATOCRIT: 38.5 % (ref 36.0–46.0)
HEMOGLOBIN: 12.8 g/dL (ref 12.0–15.0)
Hemoglobin: 11.6 g/dL — ABNORMAL LOW (ref 12.0–15.0)
LYMPHS PCT: 4 %
LYMPHS PCT: 11 %
Lymphs Abs: 0.5 10*3/uL — ABNORMAL LOW (ref 0.7–4.0)
Lymphs Abs: 1.5 10*3/uL (ref 0.7–4.0)
MCH: 33.4 pg (ref 26.0–34.0)
MCH: 33.5 pg (ref 26.0–34.0)
MCHC: 33.2 g/dL (ref 30.0–36.0)
MCHC: 33.6 g/dL (ref 30.0–36.0)
MCV: 100.5 fL — ABNORMAL HIGH (ref 78.0–100.0)
MCV: 99.7 fL (ref 78.0–100.0)
Monocytes Absolute: 0.8 10*3/uL (ref 0.1–1.0)
Monocytes Absolute: 0.7 10*3/uL (ref 0.1–1.0)
Monocytes Relative: 5 %
Monocytes Relative: 6 %
NEUTROS ABS: 13.7 10*3/uL — AB (ref 1.7–7.7)
NEUTROS ABS: 10.7 10*3/uL — AB (ref 1.7–7.7)
Neutrophils Relative %: 91 %
Neutrophils Relative %: 83 %
PLATELETS: 230 10*3/uL (ref 150–400)
Platelets: 284 10*3/uL (ref 150–400)
RBC: 3.83 MIL/uL — ABNORMAL LOW (ref 3.87–5.11)
RBC: 3.46 MIL/uL — AB (ref 3.87–5.11)
RDW: 16.6 % — ABNORMAL HIGH (ref 11.5–15.5)
RDW: 16.4 % — AB (ref 11.5–15.5)
WBC: 15 10*3/uL — AB (ref 4.0–10.5)
WBC: 12.9 10*3/uL — AB (ref 4.0–10.5)

## 2015-02-26 LAB — COMPREHENSIVE METABOLIC PANEL
ALT: 17 U/L (ref 14–54)
ANION GAP: 10 (ref 5–15)
AST: 29 U/L (ref 15–41)
Albumin: 2.4 g/dL — ABNORMAL LOW (ref 3.5–5.0)
Alkaline Phosphatase: 105 U/L (ref 38–126)
BILIRUBIN TOTAL: 0.6 mg/dL (ref 0.3–1.2)
BUN: 15 mg/dL (ref 6–20)
CHLORIDE: 94 mmol/L — AB (ref 101–111)
CO2: 25 mmol/L (ref 22–32)
Calcium: 9.9 mg/dL (ref 8.9–10.3)
Creatinine, Ser: 0.6 mg/dL (ref 0.44–1.00)
Glucose, Bld: 132 mg/dL — ABNORMAL HIGH (ref 65–99)
POTASSIUM: 3.5 mmol/L (ref 3.5–5.1)
Sodium: 129 mmol/L — ABNORMAL LOW (ref 135–145)
TOTAL PROTEIN: 5.6 g/dL — AB (ref 6.5–8.1)

## 2015-02-26 LAB — POC OCCULT BLOOD, ED: FECAL OCCULT BLD: POSITIVE — AB

## 2015-02-26 MED ORDER — VANCOMYCIN HCL IN DEXTROSE 1-5 GM/200ML-% IV SOLN
1000.0000 mg | Freq: Once | INTRAVENOUS | Status: AC
Start: 2015-02-26 — End: 2015-02-27
  Administered 2015-02-27: 1000 mg via INTRAVENOUS
  Filled 2015-02-26: qty 200

## 2015-02-26 MED ORDER — SODIUM CHLORIDE 0.9 % IV SOLN: 1000.0000 mL | INTRAVENOUS | Status: DC

## 2015-02-26 MED ORDER — SODIUM CHLORIDE 0.9 % IV BOLUS (SEPSIS)
1000.0000 mL | Freq: Once | INTRAVENOUS | Status: AC
  Administered 2015-02-27: 1000 mL via INTRAVENOUS

## 2015-02-26 MED ORDER — ACETAMINOPHEN 500 MG PO TABS
1000.0000 mg | ORAL_TABLET | Freq: Once | ORAL | Status: AC
  Administered 2015-02-27: 1000 mg via ORAL
  Filled 2015-02-26: qty 2

## 2015-02-26 MED ORDER — SODIUM CHLORIDE 0.9 % IV BOLUS (SEPSIS)
1000.0000 mL | Freq: Once | INTRAVENOUS | Status: AC
Start: 2015-02-26 — End: 2015-02-27
  Administered 2015-02-27: 1000 mL via INTRAVENOUS

## 2015-02-26 MED ORDER — SODIUM CHLORIDE 0.9 % IV BOLUS (SEPSIS): 500.0000 mL | INTRAVENOUS | Status: DC

## 2015-02-26 MED ORDER — ONDANSETRON HCL 4 MG/2ML IJ SOLN
4.0000 mg | Freq: Once | INTRAMUSCULAR | Status: AC
  Administered 2015-02-27: 4 mg via INTRAVENOUS
  Filled 2015-02-26: qty 2

## 2015-02-26 MED ORDER — FENTANYL CITRATE (PF) 100 MCG/2ML IJ SOLN
50.0000 ug | Freq: Once | INTRAMUSCULAR | Status: AC
  Administered 2015-02-27: 50 ug via INTRAVENOUS
  Filled 2015-02-26: qty 2

## 2015-02-26 MED ORDER — PIPERACILLIN-TAZOBACTAM 3.375 G IVPB 30 MIN
3.3750 g | Freq: Once | INTRAVENOUS | Status: AC
  Administered 2015-02-27: 3.375 g via INTRAVENOUS
  Filled 2015-02-26: qty 50

## 2015-02-26 NOTE — ED Provider Notes (Signed)
By signing my name below, I, Alejandra Callahan, attest that this documentation has been prepared under the direction and in the presence of Pateros, DO.  Electronically Signed: Forrestine Callahan, ED Scribe. 02/12/2015. 11:29 PM.   TIME SEEN: 11:22 PM   CHIEF COMPLAINT:  Chief Complaint  Patient presents with  . Diarrhea     HPI:  HPI Comments: Alejandra Callahan is a 70 y.o. 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 Diarrhea is described as green black and potent smelling. Family also reports a fever of 100.6, nausea, worsened shortness of breath, decreased appetite, and generalized weakness. No aggravating or alleviating factors at this time. No OTC medications or home remedies attempted prior to arrival. Last chemotherapy 4 days ago. Pt has been on Oxygen for the last 2 weeks after a collapsed lung post procedure. States she just saw Dr. Roxan Hockey with cardiothoracic surgery and had her stitches from her chest tube removed today. Had a chest x-ray at that time and they discussed the possibility of outpatient thoracentesis. She is not currently on any anticoagulants. Pt with known allergy to Codeine.  PCP: [name], MD    ROS: See HPI Constitutional: Positive fever and appetite change Eyes: no drainage  ENT: no runny nose   Cardiovascular:  no chest pain  Resp: Positive SOB  GI: no vomiting. Positive diarrhea and nausea GU: no dysuria Integumentary: no rash  Allergy: no hives  Musculoskeletal: no leg swelling  Neurological: no slurred speech. Positive weakness ROS otherwise negative  PAST MEDICAL HISTORY/PAST SURGICAL HISTORY:  Past Medical History  Diagnosis Date  . Cancer (HCC)     breast  . Arthritis     Rheumatoid arthritis  . PONV (postoperative nausea  and vomiting) 1982  . Breast cancer ([facility]) 09/1980    left unilateral mastectomy, no chemo or radiatioin  . Endometrial cancer ([place]) 2014  . Lung mass   . Pneumonia     hx    MEDICATIONS:  Prior to Admission medications   Medication Sig Start Date End Date Taking? Authorizing Provider  acetaminophen (TYLENOL) 500 MG tablet Take 500 mg by mouth every 6 (six) hours as needed for mild pain or moderate pain.    Historical Provider, MD  albuterol (PROVENTIL) (2.5 MG/3ML) 0.083% nebulizer solution Take 3 mLs (2.5 mg total) by nebulization every 4 (four) hours as needed for wheezing. 02/14/15   Thurnell Lose, MD  alendronate (FOSAMAX) 70 MG tablet Take 70 mg by mouth once a week. Take with a full glass of water on an empty stomach. Monday    Historical Provider, MD  anastrozole (ARIMIDEX) 1 MG tablet Take 1 tablet (1 mg total) by mouth daily. 02/14/15   Patrici Ranks, MD  benzonatate (TESSALON PERLES) 100 MG capsule Take 2 capsules (200 mg total) by mouth 3 (three) times daily as needed for cough. 02/15/15   Patrici Ranks, MD  bisacodyl (DULCOLAX) 5 MG EC tablet Take 2 tablets (10 mg total) by mouth daily as needed for moderate constipation. 02/14/15   Thurnell Lose, MD  Calcium-Vitamin D (CALTRATE 600 PLUS-VIT D PO) Take 1 each by mouth 2 (two) times daily.     Historical Provider, MD  cholecalciferol (VITAMIN D) 1000 UNITS tablet Take 1,000 Units by mouth at bedtime.  Historical Provider, MD  DOXOrubicin HCl Liposomal (DOXIL IV) Inject into the vein once a week.     Historical Provider, MD  feeding supplement, ENSURE ENLIVE, (ENSURE ENLIVE) LIQD Take 237 mLs by mouth 2 (two) times daily between meals. 02/14/15   Thurnell Lose, MD  ferrous sulfate 325 (65 FE) MG tablet Take 1 tablet (325 mg total) by mouth 2 (two) times daily with a meal. 02/14/15   Thurnell Lose, MD  folic acid (FOLVITE) 182 MCG tablet Take 800 mcg by mouth daily.     Historical Provider, MD  LORazepam (ATIVAN) 2 MG/ML  concentrated solution Take 0.5 mLs (1 mg total) by mouth every 6 (six) hours as needed for anxiety. 02/14/15   Thurnell Lose, MD  methotrexate (RHEUMATREX) 2.5 MG tablet Take 10 mg by mouth 2 (two) times a week. Caution:Chemotherapy. Protect from light.  Pt to takes 2.'5mg'$  tabs- 4 tablets on Friday and 4 tablets on Saturday    Historical Provider, MD  Morphine Sulfate (MORPHINE CONCENTRATE) 10 MG/0.5ML SOLN concentrated solution Take 0.5 mLs (10 mg total) by mouth every 3 (three) hours as needed for moderate pain or severe pain. 02/14/15   Thurnell Lose, MD  Multiple Vitamin (MULTIVITAMIN) tablet Take 1 tablet by mouth daily.    Historical Provider, MD  predniSONE (DELTASONE) 5 MG tablet Take 5 mg by mouth daily as needed (flare up).     Historical Provider, MD  predniSONE (DELTASONE) 5 MG tablet Label  & dispense according to the schedule below. 10 Pills PO for 3 days then, 8 Pills PO for 3 days, 6 Pills PO for 3 days, 4 Pills PO for 3 days, 2 Pills PO for 3 days, 1 Pills PO for 3 days, 1/2 Pill  PO for 3 days then STOP. Total 95 pills. 02/14/15   Thurnell Lose, MD  raloxifene (EVISTA) 60 MG tablet Take 60 mg by mouth daily.    Historical Provider, MD    ALLERGIES:  Allergies  Allergen Reactions  . Codeine Other (See Comments)    Blurred vision and dizziness blurred vision, dizzy, loss of hearing  . Acyclovir And Related Swelling, Dermatitis and Rash    fatigue  . Other     TB test- arm swells/fever to extremity  Tegaderm badage- pulls skin off, rash  . Tuberculin Ppd Other (See Comments)    Swelling and redness in arm  . Tylox [Oxycodone-Acetaminophen] Other (See Comments)    Blurred vision and dizziness Blurred vision, dizziness    SOCIAL HISTORY:  Social History  Substance Use Topics  . Smoking status: Never Smoker   . Smokeless tobacco: Never Used  . Alcohol Use: No    FAMILY HISTORY: Family History  Problem Relation Age of Onset  . Breast cancer Mother 82  . Heart  attack Mother 6  . Colon cancer Father 6  . Heart attack Father 18  . Breast cancer Sister 35  . Heart attack Paternal Grandfather 14  . Breast cancer Other     mother's paternal first cousin dx in late 40s-early 42s    EXAM: BP 108/58 mmHg  Pulse 126  Temp(Src) 98.4 F (36.9 C) (Oral)  Resp 28  Ht '4\' 11"'$  (1.499 m)  Wt 107 lb (48.535 kg)  BMI 21.60 kg/m2  SpO2 98% CONSTITUTIONAL: Alert and oriented and responds appropriately to questions. Chronically ill appearing, febrile, non-toxic; rectal temp 100.9 HEAD: Normocephalic EYES: Conjunctivae clear, PERRL ENT: normal nose; no rhinorrhea; slightly dry appearing mucous  membranes; pharynx without lesions noted NECK: Supple, no meningismus, no LAD  CARD: R and tachycardic; S1 and S2 appreciated; no murmurs, no clicks, no rubs, no gallops CHEST:  Patient has a Port-A-Cath in the right chest wall without erythema, warmth, tenderness, fluctuance or induration RESP: Normal chest excursion without splinting or tachypnea; breath sounds completely diminished on the left side also diminished on the right without wheezing, rhonchi or rales. She is not hypoxic on oxygen through nasal cannula. She does not have significant increased work of breathing or respiratory distress. Speaking in full sentences ABD/GI: Normal bowel sounds; non-distended; soft, mildly tender throughout abdomen, no rebound, no guarding, no peritoneal signs RECTAL: Normal rectal tone; no hemorrhoids; no gross blood or melana. Pt does have green liquidly stool that is guaiac positive BACK:  The back appears normal and is non-tender to palpation, there is no CVA tenderness EXT: Normal ROM in all joints; non-tender to palpation; no edema; normal capillary refill; no cyanosis, no calf tenderness or swelling    SKIN: Normal color for age and race; warm, no rash NEURO: Moves all extremities equally, sensation to light touch intact diffusely, cranial nerves II through XII  intact PSYCH: The patient's mood and manner are appropriate. Grooming and personal hygiene are appropriate.  MEDICAL DECISION MAKING: Patient here with sepsis. She is febrile, tachycardic but otherwise hemodynamically stable. Complains of diarrhea and abdominal pain. We'll obtain labs, cultures, CT of her abdomen and pelvis, urine. Will give IV fluids and broad-spectrum antibiotics given she is septic and immunocompromise. She will need admission.   It is difficult to ascertain whether or not patient is having worsening shortness of breath at baseline. She has a known large left pleural effusion.  We'll obtain a CT of her chest for further evaluation.  ED PROGRESS: Patient's labs showed mild leukocytosis with left shift. Troponin is negative. Lactate normal. CT of her chest shows no pulmonary embolus. It does show that her left-sided lung mass has grown significantly in 2 months. She also has a loculated left-sided malignant pleural effusion with compressive atelectasis. There is also wall thickening around the descending and sigmoid colon on the CT of the abdomen concerning for colitis. Suspect this is infectious in nature. There is compression of the left pulmonary artery by the enlarging left lung mass but does remain patent. Patient is still hemodynamically stable. She is not in any respiratory distress. She will need to be admitted. She is comfortable with this plan. Pain is been well controlled with IV fentanyl.  2:40 AM  D/w Dr. Myna Hidalgo with hospitalist service who agrees on admission to telemetry, inpatient. I will place holding orders per his request. Care transferred to hospitalist service.   Date: 02/27/2015 00:36  Rate: 126  Rhythm: sinus tachycardia  QRS Axis: normal  Intervals: normal  ST/T Wave abnormalities: normal  Conduction Disutrbances: none  Narrative Interpretation: Sinus tachycardia, no significant change compared to prior EKG       CRITICAL CARE Performed by: Nyra Jabs   Total critical care time: 45 minutes  Critical care time was exclusive of separately billable procedures and treating other patients.  Critical care was necessary to treat or prevent imminent or life-threatening deterioration.  Critical care was time spent personally by me on the following activities: development of treatment plan with patient and/or surrogate as well as nursing, discussions with consultants, evaluation of patient's response to treatment, examination of patient, obtaining history from patient or surrogate, ordering and performing treatments and interventions, ordering  and review of laboratory studies, ordering and review of radiographic studies, pulse oximetry and re-evaluation of patient's condition.      I personally performed the services described in this documentation, which was scribed in my presence. The recorded information has been reviewed and is accurate.   [place], DO 02/27/15 [id number]

## 2015-02-26 NOTE — ED Notes (Signed)
Pt with chemo last Thursday, diarrhea started today at 1430

## 2015-02-26 NOTE — Progress Notes (Signed)
Alejandra Callahan's reason for visit today is for labs as scheduled per MD orders.  Venipuncture performed with a 23 gauge butterfly needle to R Antecubital.  Alejandra Callahan tolerated procedure well and without incident; questions were answered and patient was discharged.

## 2015-02-26 NOTE — Progress Notes (Signed)
[address]       [place] [address]             [phone]       HPI: Alejandra Callahan returns today for scheduled follow-up visit  She is a 70 year old woman with metastatic endometrial cancer who had bilateral lung metastases. She had chemotherapy for that and had a good response in all but a couple of lesions in the left lung. These lesions continue to grow rapidly. I did a navigational bronchoscopy to attempt to determine whether these were metastatic endometrial cancer or some other process. Unfortunately, during that procedure she developed a pneumothorax. She was stable initially and went home, but presented back with shortness of breath and had a hemopneumothorax. I did a left VATS, drainage effusion, and biopsied the lung. The lung masses did turn out to be metastatic endometrial cancer.  She went home about 2 weeks ago. She started on chemotherapy and has had 2 cycles already. She had rather severe nausea after the second cycle. She is on home oxygen. She feels like her breathing has gotten worse. She is not having much incisional pain.  Past Medical History  Diagnosis Date  . Cancer (HCC)     breast  . Arthritis     Rheumatoid arthritis  . PONV (postoperative nausea and vomiting) 1982  . Breast cancer ([place]) 09/1980    left unilateral mastectomy, no chemo or radiatioin  . Endometrial cancer ([place]) 2014  . Lung mass   . Pneumonia     hx     Current Outpatient Prescriptions  Medication Sig Dispense Refill  . acetaminophen (TYLENOL) 500 MG tablet Take 500 mg by mouth every 6 (six) hours as needed for mild pain or moderate pain.    Marland Kitchen albuterol (PROVENTIL) (2.5 MG/3ML) 0.083% nebulizer solution Take 3 mLs (2.5 mg total) by nebulization every 4 (four) hours as needed for wheezing. 75 mL 12  . alendronate (FOSAMAX) 70 MG tablet Take 70 mg by mouth once a week. Take with a full glass of water on an empty stomach. Monday    . anastrozole (ARIMIDEX) 1 MG tablet  Take 1 tablet (1 mg total) by mouth daily. 30 tablet 1  . benzonatate (TESSALON PERLES) 100 MG capsule Take 2 capsules (200 mg total) by mouth 3 (three) times daily as needed for cough. 90 capsule 0  . bisacodyl (DULCOLAX) 5 MG EC tablet Take 2 tablets (10 mg total) by mouth daily as needed for moderate constipation. 30 tablet 0  . Calcium-Vitamin D (CALTRATE 600 PLUS-VIT D PO) Take 1 each by mouth 2 (two) times daily.     . cholecalciferol (VITAMIN D) 1000 UNITS tablet Take 1,000 Units by mouth at bedtime.     Marland Kitchen DOXOrubicin HCl Liposomal (DOXIL IV) Inject into the vein once a week.     . feeding supplement, ENSURE ENLIVE, (ENSURE ENLIVE) LIQD Take 237 mLs by mouth 2 (two) times daily between meals. 237 mL 12  . ferrous sulfate 325 (65 FE) MG tablet Take 1 tablet (325 mg total) by mouth 2 (two) times daily with a meal. 60 tablet 0  . folic acid (FOLVITE) 924 MCG tablet Take 800 mcg by mouth daily.     Marland Kitchen LORazepam (ATIVAN) 2 MG/ML concentrated solution Take 0.5 mLs (1 mg total) by mouth every 6 (six) hours as needed for anxiety. 30 mL 0  . methotrexate (RHEUMATREX) 2.5 MG tablet Take 10 mg by mouth 2 (two)  times a week. Caution:Chemotherapy. Protect from light.  Pt to takes 2.'5mg'$  tabs- 4 tablets on Friday and 4 tablets on Saturday    . Morphine Sulfate (MORPHINE CONCENTRATE) 10 MG/0.5ML SOLN concentrated solution Take 0.5 mLs (10 mg total) by mouth every 3 (three) hours as needed for moderate pain or severe pain. 30 mL 0  . Multiple Vitamin (MULTIVITAMIN) tablet Take 1 tablet by mouth daily.    . predniSONE (DELTASONE) 5 MG tablet Take 5 mg by mouth daily as needed (flare up).     . predniSONE (DELTASONE) 5 MG tablet Label  & dispense according to the schedule below. 10 Pills PO for 3 days then, 8 Pills PO for 3 days, 6 Pills PO for 3 days, 4 Pills PO for 3 days, 2 Pills PO for 3 days, 1 Pills PO for 3 days, 1/2 Pill  PO for 3 days then STOP. Total 95 pills. 95 tablet 0  . raloxifene (EVISTA) 60 MG  tablet Take 60 mg by mouth daily.     No current facility-administered medications for this visit.    Physical Exam BP 128/74 mmHg  Pulse 130  Temp(Src) 98.6 F (37 C) (Oral)  Resp 20  Ht '4\' 11"'$  (1.499 m)  Wt 110 lb (49.896 kg)  BMI 22.21 kg/m2  SpO87 58% 70 year old woman. Appears ill. Alert and oriented 3 Tachycardic with regular rhythm Lungs with diminished breath sounds on the left side Incision healing well  Diagnostic Tests: I personally reviewed her chest x-ray. It shows significant opacity in the left chest. Some of this is tumor and some is effusion. It is impossible to differentiate how much effusion there truly is on a plain chest x-ray.   Impression/ Plan:  70 year old woman with metastatic endometrial cancer including rapidly progressive lung metastases. She has started on salvage chemotherapy. Her clinical condition continues to deteriorate. She is tachycardic and relatively tachypnic as well.   Her chest x-ray is hard to interpret. She does not have a pneumothorax. She does have some left effusion. She also has a large tumor mass in the left lung. It is hard to tell how large the effusion is due to the underlying mass. I recommended that we do an ultrasound guided thoracentesis if there is enough fluid that is accessible to drain. She would like that to be done at [facility]. We will see if we can arrange that.  She is seeing Dr. Whitney Muse later today.  Alejandra Callahan would like to do all of her follow-up in [place]. I would be happy to see her back here if I can be of any assistance with her care.    [name], MD [facility] [phone]

## 2015-02-26 NOTE — Telephone Encounter (Signed)
Called daughter Leota Sauers to notift her of pt hemoglobin, to update [place] in the am of her mothers condition, and we would see them on Thursday.

## 2015-02-27 ENCOUNTER — Inpatient Hospital Stay (HOSPITAL_COMMUNITY): Payer: Medicare Other

## 2015-02-27 ENCOUNTER — Other Ambulatory Visit: Payer: Self-pay

## 2015-02-27 ENCOUNTER — Encounter (HOSPITAL_COMMUNITY): Payer: Self-pay

## 2015-02-27 DIAGNOSIS — C7802 Secondary malignant neoplasm of left lung: Secondary | ICD-10-CM | POA: Diagnosis present

## 2015-02-27 DIAGNOSIS — D6481 Anemia due to antineoplastic chemotherapy: Secondary | ICD-10-CM | POA: Diagnosis present

## 2015-02-27 DIAGNOSIS — Z7401 Bed confinement status: Secondary | ICD-10-CM | POA: Diagnosis not present

## 2015-02-27 DIAGNOSIS — Z9012 Acquired absence of left breast and nipple: Secondary | ICD-10-CM | POA: Diagnosis not present

## 2015-02-27 DIAGNOSIS — Z7952 Long term (current) use of systemic steroids: Secondary | ICD-10-CM | POA: Diagnosis not present

## 2015-02-27 DIAGNOSIS — R34 Anuria and oliguria: Secondary | ICD-10-CM | POA: Diagnosis present

## 2015-02-27 DIAGNOSIS — A414 Sepsis due to anaerobes: Secondary | ICD-10-CM | POA: Diagnosis present

## 2015-02-27 DIAGNOSIS — C7801 Secondary malignant neoplasm of right lung: Secondary | ICD-10-CM | POA: Diagnosis present

## 2015-02-27 DIAGNOSIS — J948 Other specified pleural conditions: Secondary | ICD-10-CM | POA: Diagnosis not present

## 2015-02-27 DIAGNOSIS — R0602 Shortness of breath: Secondary | ICD-10-CM

## 2015-02-27 DIAGNOSIS — Z800 Family history of malignant neoplasm of digestive organs: Secondary | ICD-10-CM | POA: Diagnosis not present

## 2015-02-27 DIAGNOSIS — Z8249 Family history of ischemic heart disease and other diseases of the circulatory system: Secondary | ICD-10-CM | POA: Diagnosis not present

## 2015-02-27 DIAGNOSIS — J188 Other pneumonia, unspecified organism: Secondary | ICD-10-CM | POA: Diagnosis present

## 2015-02-27 DIAGNOSIS — C541 Malignant neoplasm of endometrium: Secondary | ICD-10-CM | POA: Diagnosis present

## 2015-02-27 DIAGNOSIS — K529 Noninfective gastroenteritis and colitis, unspecified: Secondary | ICD-10-CM | POA: Diagnosis present

## 2015-02-27 DIAGNOSIS — J90 Pleural effusion, not elsewhere classified: Secondary | ICD-10-CM | POA: Insufficient documentation

## 2015-02-27 DIAGNOSIS — R0682 Tachypnea, not elsewhere classified: Secondary | ICD-10-CM | POA: Diagnosis not present

## 2015-02-27 DIAGNOSIS — M069 Rheumatoid arthritis, unspecified: Secondary | ICD-10-CM | POA: Diagnosis present

## 2015-02-27 DIAGNOSIS — E876 Hypokalemia: Secondary | ICD-10-CM | POA: Diagnosis present

## 2015-02-27 DIAGNOSIS — Z9889 Other specified postprocedural states: Secondary | ICD-10-CM

## 2015-02-27 DIAGNOSIS — E860 Dehydration: Secondary | ICD-10-CM | POA: Diagnosis present

## 2015-02-27 DIAGNOSIS — R000 Tachycardia, unspecified: Secondary | ICD-10-CM | POA: Diagnosis not present

## 2015-02-27 DIAGNOSIS — A419 Sepsis, unspecified organism: Secondary | ICD-10-CM | POA: Diagnosis present

## 2015-02-27 DIAGNOSIS — R1032 Left lower quadrant pain: Secondary | ICD-10-CM | POA: Diagnosis present

## 2015-02-27 DIAGNOSIS — E871 Hypo-osmolality and hyponatremia: Secondary | ICD-10-CM | POA: Diagnosis present

## 2015-02-27 DIAGNOSIS — Z66 Do not resuscitate: Secondary | ICD-10-CM | POA: Diagnosis not present

## 2015-02-27 DIAGNOSIS — J910 Malignant pleural effusion: Secondary | ICD-10-CM | POA: Diagnosis present

## 2015-02-27 DIAGNOSIS — Z885 Allergy status to narcotic agent status: Secondary | ICD-10-CM | POA: Diagnosis not present

## 2015-02-27 DIAGNOSIS — Z515 Encounter for palliative care: Secondary | ICD-10-CM | POA: Diagnosis not present

## 2015-02-27 DIAGNOSIS — Z803 Family history of malignant neoplasm of breast: Secondary | ICD-10-CM | POA: Diagnosis not present

## 2015-02-27 DIAGNOSIS — D6489 Other specified anemias: Secondary | ICD-10-CM | POA: Diagnosis not present

## 2015-02-27 DIAGNOSIS — E440 Moderate protein-calorie malnutrition: Secondary | ICD-10-CM | POA: Diagnosis present

## 2015-02-27 DIAGNOSIS — R195 Other fecal abnormalities: Secondary | ICD-10-CM | POA: Diagnosis not present

## 2015-02-27 DIAGNOSIS — Z853 Personal history of malignant neoplasm of breast: Secondary | ICD-10-CM | POA: Diagnosis not present

## 2015-02-27 DIAGNOSIS — J9621 Acute and chronic respiratory failure with hypoxia: Secondary | ICD-10-CM | POA: Diagnosis present

## 2015-02-27 DIAGNOSIS — R918 Other nonspecific abnormal finding of lung field: Secondary | ICD-10-CM | POA: Diagnosis not present

## 2015-02-27 DIAGNOSIS — T451X5A Adverse effect of antineoplastic and immunosuppressive drugs, initial encounter: Secondary | ICD-10-CM | POA: Diagnosis present

## 2015-02-27 DIAGNOSIS — Z79811 Long term (current) use of aromatase inhibitors: Secondary | ICD-10-CM | POA: Diagnosis not present

## 2015-02-27 DIAGNOSIS — A047 Enterocolitis due to Clostridium difficile: Secondary | ICD-10-CM | POA: Diagnosis present

## 2015-02-27 LAB — URINE MICROSCOPIC-ADD ON

## 2015-02-27 LAB — URINALYSIS, ROUTINE W REFLEX MICROSCOPIC
BILIRUBIN URINE: NEGATIVE
GLUCOSE, UA: NEGATIVE mg/dL
NITRITE: NEGATIVE
PH: 5.5 (ref 5.0–8.0)
Protein, ur: NEGATIVE mg/dL
Specific Gravity, Urine: 1.025 (ref 1.005–1.030)

## 2015-02-27 LAB — C DIFFICILE QUICK SCREEN W PCR REFLEX
C DIFFICILE (CDIFF) TOXIN: NEGATIVE
C DIFFICLE (CDIFF) ANTIGEN: POSITIVE — AB

## 2015-02-27 LAB — BODY FLUID CELL COUNT WITH DIFFERENTIAL
EOS FL: 1 %
Lymphs, Fluid: 65 %
Monocyte-Macrophage-Serous Fluid: 3 % — ABNORMAL LOW (ref 50–90)
Neutrophil Count, Fluid: 31 % — ABNORMAL HIGH (ref 0–25)
Other Cells, Fluid: 0 %
WBC FLUID: 744 uL (ref 0–1000)

## 2015-02-27 LAB — GLUCOSE, SEROUS FLUID: Glucose, Fluid: 100 mg/dL

## 2015-02-27 LAB — LACTIC ACID, PLASMA
LACTIC ACID, VENOUS: 1.4 mmol/L (ref 0.5–2.0)
Lactic Acid, Venous: 1.6 mmol/L (ref 0.5–2.0)
Lactic Acid, Venous: 1.7 mmol/L (ref 0.5–2.0)

## 2015-02-27 LAB — TROPONIN I: Troponin I: <0.03 ng/mL (ref <0.031)

## 2015-02-27 LAB — PROCALCITONIN: PROCALCITONIN: 0.22 ng/mL

## 2015-02-27 LAB — PREALBUMIN: Prealbumin: 10.3 mg/dL — ABNORMAL LOW (ref 18–38)

## 2015-02-27 LAB — GLUCOSE, CAPILLARY: Glucose-Capillary: 111 mg/dL — ABNORMAL HIGH (ref 65–99)

## 2015-02-27 LAB — APTT: APTT: 26 s (ref 24–37)

## 2015-02-27 LAB — LACTATE DEHYDROGENASE, PLEURAL OR PERITONEAL FLUID: LD FL: 1518 U/L — AB (ref 3–23)

## 2015-02-27 LAB — PROTIME-INR
INR: 1.26 (ref 0.00–1.49)
PROTHROMBIN TIME: 16 s — AB (ref 11.6–15.2)

## 2015-02-27 MED ORDER — FOLIC ACID 1 MG PO TABS
1.0000 mg | ORAL_TABLET | Freq: Every day | ORAL | Status: DC
  Administered 2015-02-27 – 2015-03-01 (×3): 1 mg via ORAL
  Filled 2015-02-27 (×3): qty 1

## 2015-02-27 MED ORDER — ANASTROZOLE 1 MG PO TABS
1.0000 mg | ORAL_TABLET | Freq: Every day | ORAL | Status: DC
  Administered 2015-02-27 – 2015-03-01 (×3): 1 mg via ORAL
  Filled 2015-02-27 (×6): qty 1

## 2015-02-27 MED ORDER — ONDANSETRON HCL 4 MG PO TABS
4.0000 mg | ORAL_TABLET | Freq: Four times a day (QID) | ORAL | Status: DC | PRN
  Administered 2015-02-28: 4 mg via ORAL
  Filled 2015-02-27: qty 1

## 2015-02-27 MED ORDER — SODIUM CHLORIDE 0.9% FLUSH
3.0000 mL | Freq: Two times a day (BID) | INTRAVENOUS | Status: DC
  Administered 2015-02-27 – 2015-02-28 (×4): 3 mL via INTRAVENOUS
  Administered 2015-03-01: 10 mL via INTRAVENOUS
  Administered 2015-03-01: 3 mL via INTRAVENOUS

## 2015-02-27 MED ORDER — ADULT MULTIVITAMIN W/MINERALS CH
1.0000 | ORAL_TABLET | Freq: Every day | ORAL | Status: DC
  Administered 2015-02-27 – 2015-03-01 (×3): 1 via ORAL
  Filled 2015-02-27 (×3): qty 1

## 2015-02-27 MED ORDER — ALBUTEROL SULFATE (2.5 MG/3ML) 0.083% IN NEBU
5.0000 mg | INHALATION_SOLUTION | Freq: Once | RESPIRATORY_TRACT | Status: AC
  Administered 2015-02-27: 5 mg via RESPIRATORY_TRACT
  Filled 2015-02-27: qty 6

## 2015-02-27 MED ORDER — FERROUS SULFATE 325 (65 FE) MG PO TABS
325.0000 mg | ORAL_TABLET | Freq: Two times a day (BID) | ORAL | Status: DC
  Administered 2015-02-27 – 2015-03-01 (×5): 325 mg via ORAL
  Filled 2015-02-27 (×5): qty 1

## 2015-02-27 MED ORDER — LEVALBUTEROL HCL 0.63 MG/3ML IN NEBU
0.6300 mg | INHALATION_SOLUTION | Freq: Four times a day (QID) | RESPIRATORY_TRACT | Status: DC
  Administered 2015-02-28 – 2015-03-01 (×7): 0.63 mg via RESPIRATORY_TRACT
  Filled 2015-02-27 (×7): qty 3

## 2015-02-27 MED ORDER — IOHEXOL 350 MG/ML SOLN
100.0000 mL | Freq: Once | INTRAVENOUS | Status: AC | PRN
  Administered 2015-02-27: 100 mL via INTRAVENOUS

## 2015-02-27 MED ORDER — SODIUM CHLORIDE 0.9 % IV BOLUS (SEPSIS)
500.0000 mL | Freq: Once | INTRAVENOUS | Status: AC
  Administered 2015-02-27: 500 mL via INTRAVENOUS

## 2015-02-27 MED ORDER — ACETAMINOPHEN 500 MG PO TABS: 500.0000 mg | ORAL_TABLET | Freq: Four times a day (QID) | ORAL | Status: DC | PRN

## 2015-02-27 MED ORDER — FAMOTIDINE IN NACL 20-0.9 MG/50ML-% IV SOLN
20.0000 mg | Freq: Two times a day (BID) | INTRAVENOUS | Status: DC
  Administered 2015-02-27 – 2015-03-03 (×9): 20 mg via INTRAVENOUS
  Filled 2015-02-27 (×11): qty 50

## 2015-02-27 MED ORDER — BUDESONIDE 0.25 MG/2ML IN SUSP
0.2500 mg | Freq: Two times a day (BID) | RESPIRATORY_TRACT | Status: DC
  Administered 2015-02-27 – 2015-03-03 (×8): 0.25 mg via RESPIRATORY_TRACT
  Filled 2015-02-27 (×10): qty 2

## 2015-02-27 MED ORDER — FENTANYL CITRATE (PF) 100 MCG/2ML IJ SOLN
50.0000 ug | Freq: Once | INTRAMUSCULAR | Status: AC
  Administered 2015-02-27: 50 ug via INTRAVENOUS
  Filled 2015-02-27: qty 2

## 2015-02-27 MED ORDER — FUROSEMIDE 10 MG/ML IJ SOLN
20.0000 mg | Freq: Once | INTRAMUSCULAR | Status: AC
  Administered 2015-02-27: 20 mg via INTRAVENOUS
  Filled 2015-02-27: qty 2

## 2015-02-27 MED ORDER — LORAZEPAM 1 MG PO TABS
1.0000 mg | ORAL_TABLET | Freq: Four times a day (QID) | ORAL | Status: DC | PRN
  Administered 2015-02-27 – 2015-03-02 (×4): 1 mg via ORAL
  Filled 2015-02-27 (×4): qty 1

## 2015-02-27 MED ORDER — ONDANSETRON HCL 4 MG/2ML IJ SOLN: 4.0000 mg | Freq: Four times a day (QID) | INTRAMUSCULAR | Status: DC | PRN

## 2015-02-27 MED ORDER — MORPHINE SULFATE (CONCENTRATE) 10 MG/0.5ML PO SOLN
10.0000 mg | ORAL | Status: DC | PRN
  Administered 2015-02-27 – 2015-03-02 (×7): 10 mg via ORAL
  Filled 2015-02-27 (×7): qty 0.5

## 2015-02-27 MED ORDER — RALOXIFENE HCL 60 MG PO TABS
60.0000 mg | ORAL_TABLET | Freq: Every day | ORAL | Status: DC
  Administered 2015-02-27 – 2015-03-01 (×3): 60 mg via ORAL
  Filled 2015-02-27 (×3): qty 1

## 2015-02-27 MED ORDER — ALBUTEROL SULFATE (2.5 MG/3ML) 0.083% IN NEBU: 2.5000 mg | INHALATION_SOLUTION | RESPIRATORY_TRACT | Status: DC | PRN

## 2015-02-27 MED ORDER — ALBUTEROL SULFATE (2.5 MG/3ML) 0.083% IN NEBU
2.5000 mg | INHALATION_SOLUTION | RESPIRATORY_TRACT | Status: DC | PRN
  Administered 2015-02-27: 2.5 mg via RESPIRATORY_TRACT
  Filled 2015-02-27: qty 3

## 2015-02-27 MED ORDER — BENZONATATE 100 MG PO CAPS
200.0000 mg | ORAL_CAPSULE | Freq: Three times a day (TID) | ORAL | Status: DC | PRN
  Administered 2015-02-27 – 2015-03-02 (×5): 200 mg via ORAL
  Filled 2015-02-27 (×5): qty 2

## 2015-02-27 MED ORDER — PANTOPRAZOLE SODIUM 40 MG IV SOLR
40.0000 mg | Freq: Two times a day (BID) | INTRAVENOUS | Status: DC
  Administered 2015-02-27: 40 mg via INTRAVENOUS
  Filled 2015-02-27: qty 40

## 2015-02-27 MED ORDER — METHYLPREDNISOLONE SODIUM SUCC 125 MG IJ SOLR
125.0000 mg | Freq: Once | INTRAMUSCULAR | Status: AC
  Administered 2015-02-27: 125 mg via INTRAVENOUS
  Filled 2015-02-27: qty 2

## 2015-02-27 MED ORDER — ENSURE ENLIVE PO LIQD
237.0000 mL | Freq: Two times a day (BID) | ORAL | Status: DC
  Administered 2015-02-28: 237 mL via ORAL

## 2015-02-27 MED ORDER — SODIUM CHLORIDE 0.9 % IV SOLN
INTRAVENOUS | Status: AC
  Administered 2015-02-27: 05:00:00 via INTRAVENOUS
  Administered 2015-02-27: 1 mL via INTRAVENOUS

## 2015-02-27 MED ORDER — VANCOMYCIN 50 MG/ML ORAL SOLUTION
125.0000 mg | Freq: Four times a day (QID) | ORAL | Status: DC
  Administered 2015-02-27 – 2015-03-01 (×9): 125 mg via ORAL
  Filled 2015-02-27 (×20): qty 2.5

## 2015-02-27 MED ORDER — VITAMIN D 1000 UNITS PO TABS
1000.0000 [IU] | ORAL_TABLET | Freq: Every day | ORAL | Status: DC
  Administered 2015-02-27 – 2015-03-01 (×3): 1000 [IU] via ORAL
  Filled 2015-02-27 (×3): qty 1

## 2015-02-27 MED ORDER — ALBUTEROL SULFATE (2.5 MG/3ML) 0.083% IN NEBU
2.5000 mg | INHALATION_SOLUTION | Freq: Three times a day (TID) | RESPIRATORY_TRACT | Status: DC
  Administered 2015-02-27: 2.5 mg via RESPIRATORY_TRACT
  Filled 2015-02-27: qty 3

## 2015-02-27 NOTE — Progress Notes (Signed)
Called urgently to see patient secondary to acute respiratory failure. Patient chronically on 4 L of oxygen at home admitted secondary to shortness of breath along with abdominal pain and black diarrhea. 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 difficile colitis. Patient with elevated heart rate into the 130s 140s, mildly using accessory muscles, wheezing and with poor air movement. No fever, no chest pain.  Plan: -Stat chest x-ray has been order -IV solumedrol 125 mg once  and Pulmicort nebulization initiated -Since the patient received aggressive fluid resuscitation on admission will give a low dose of Lasix -Continue oxygen supplementation at 4 L -Will transfer patient to step down -will check stat EKG; but per telemetry appears to be SVT. -Will follow clinical response.[place], [place]

## 2015-02-27 NOTE — Progress Notes (Signed)
0400-Patient arrived to the floor and set up in a room.  Admission completed and all orders carried out.  Waiting on a stool sample from patient to obtain a specimen for a CDIFF PCR and a gastrointestinal panel.  Patient has been advised about needing a stool sample.  Enteric precautions have been placed for precautionary reasons until we get the results of the CDIFF PCR test.  Patient is resting with no signs or symptoms of distress.  Patient denies any physical or emotional needs.  Family is at bedside.  No questions or concerns at this time.

## 2015-02-27 NOTE — H&P (Signed)
[facility] History and Physical  Alejandra Callahan QBV:[id number] DOB: 1945/04/18 DOA: 03/01/2015  Referring physician: ED physician PCP: [name], MD  Specialists: Dr. Whitney Muse (oncology), Dr. Roxan Hockey (CTS), Dr. Alycia Rossetti (OBGYN)   Chief Complaint:  Black diarrhea, SOB   HPI: Alejandra Callahan is an unfortunate 70 y.o. 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 Patient is followed in the outpatient setting by Dr. Whitney Muse of oncology and Dr. Roxan Hockey of CT surgery. She has a large left-sided lung mass with associated effusion that is status post biopsy. She has experienced significant shortness of breath with this and had been recommended for outpatient thoracentesis, but this had not yet been arranged. At approximately 3 PM on the day of her presentation, patient developed some generalized abdominal pain and diarrhea. Per report, the diarrhea had been black and the patient was having repeated episodes with very little rest in between. There was concern for blood loss in this patient who had just required blood transfusion last week, and so she was brought into the ED by her family. She apparently woke in in her usual state but developed the fever and GI distress in the afternoon. She reportedly took Tylenol just prior to her presentation.  In ED, patient was found to be afebrile, saturating well on room air, but tachypnea with rate in the mid to high 20s and tachycardic to 130. Blood pressure was in the 110/55 range. Chest x-ray demonstrated a near complete opacification of the left hemithorax and this was followed up with CT of the abdomen pelvis and chest with contrast. CT demonstrates rapid interval enlargement of left lung mass and associated effusion. Also demonstrated is wall thickening in the colon  suggestive of colitis. DRE produced dark stool that was weakly FOBT positive. CBC is notable for a leukocytosis to 13,000 and hemoglobin of 11.6. Chem panel features a hyponatremia to 129 and serum albumin of 2.4. Urinalysis was unremarkable. Lactic acid returned reassuring at a value of 1.6 and troponin was undetectable. After blood and urine cultures have been obtained, patient was treated empirically with vancomycin and Zosyn. She was bolused with 2 L of normal saline and continued on IV fluids at a rate of 125 cc/hr. C. difficile assay was ordered but the patient had no further diarrhea after arriving in the ED. Blood pressure remained stable and tachycardia improved with fluids. Patient was placed on contact isolation and admitted to the hospital for ongoing evaluation and management of acute onset dark diarrhea with CT features suggestive of colitis, as well as dyspnea in the setting of a rapidly enlarging left lung mass with effusion.  Where does patient live?   At home    Can patient participate in ADLs?   Some   Review of Systems:   General: no sweats. Fever, chills, loss of appetite, fatigue.  HEENT: no blurry vision, hearing changes or sore throat Pulm: no wheeze. Dyspnea, non-productive cough CV: no chest pain or palpitations Abd: no vomiting. Nausea, diarrhea, abdominal pain.  GU: no dysuria, hematuria, increased urinary frequency, or urgency  Ext: no leg edema Neuro: no focal weakness, numbness, or tingling, no vision change or hearing loss Skin: no rash, no wounds MSK: No muscle spasm, no deformity, no red, hot, or swollen joint Heme: No easy bruising or bleeding Travel history: No recent long  distant travel    Allergy:  Allergies  Allergen Reactions  . Codeine Other (See Comments)    Blurred vision and dizziness blurred vision, dizzy, loss of hearing  . Acyclovir And Related Swelling, Dermatitis and Rash    fatigue  . Other     TB test- arm swells/fever to  extremity  Tegaderm badage- pulls skin off, rash  . Tuberculin Ppd Other (See Comments)    Swelling and redness in arm  . Tylox [Oxycodone-Acetaminophen] Other (See Comments)    Blurred vision and dizziness Blurred vision, dizziness    Past Medical History  Diagnosis Date  . Cancer (HCC)     breast  . Arthritis     Rheumatoid arthritis  . PONV (postoperative nausea and vomiting) 1982  . Breast cancer ([place]) 09/1980    left unilateral mastectomy, no chemo or radiatioin  . Endometrial cancer ([place]) 2014  . Lung mass   . Pneumonia     hx    Past Surgical History  Procedure Laterality Date  . Mastectomy Left 09/1980  . Tubal ligation  April 1982  . Dilatation & currettage/hysteroscopy with resectocope N/A 11/18/2012    Procedure: DILATATION & CURETTAGE/HYSTEROSCOPY WITH RESECTOCOPE;  Surgeon: Marvene Staff, MD;  Location: [facility];  Service: Gynecology;  Laterality: N/A;  . Breast biopsy Left 09/11/80  . Vaginal hysterectomy  12/09/2012    Complete hysterectomy - [place] placement Right 8/16  . Abdominal hysterectomy    . Lung biopsy  01/18/15  . Video bronchoscopy with endobronchial navigation N/A 02/04/2015    Procedure: VIDEO BRONCHOSCOPY WITH ENDOBRONCHIAL NAVIGATION;  Surgeon: [name], MD;  Location: [facility];  Service: Thoracic;  Laterality: N/A;  . Video assisted thoracoscopy Left 02/08/2015    Procedure: VIDEO ASSISTED THORACOSCOPY, lung biopsy;  Surgeon: [name], MD;  Location: [place] [facility];  Service: Thoracic;  Laterality: Left;  . Pleural effusion drainage Left 02/08/2015    Procedure: DRAINAGE OF PLEURAL EFFUSION;  Surgeon: [name], MD;  Location: [place];  Service: Thoracic;  Laterality: Left;    Social History:  reports that she has never smoked. She has never used smokeless tobacco. She reports that she does not drink alcohol or use illicit drugs.  Family History:  Family History  Problem Relation Age of Onset  .  Breast cancer Mother 65  . Heart attack Mother 33  . Colon cancer Father 7  . Heart attack Father 29  . Breast cancer Sister 46  . Heart attack Paternal Grandfather 5  . Breast cancer Other     mother's paternal first cousin dx in late 69s-early 83s     Prior to Admission medications   Medication Sig Start Date End Date Taking? Authorizing Provider  acetaminophen (TYLENOL) 500 MG tablet Take 500 mg by mouth every 6 (six) hours as needed for mild pain or moderate pain.   Yes Historical Provider, MD  albuterol (PROVENTIL) (2.5 MG/3ML) 0.083% nebulizer solution Take 3 mLs (2.5 mg total) by nebulization every 4 (four) hours as needed for wheezing. 02/14/15  Yes Thurnell Lose, MD  alendronate (FOSAMAX) 70 MG tablet Take 70 mg by mouth once a week. Take with a full glass of water on an empty stomach. Monday   Yes Historical Provider, MD  anastrozole (ARIMIDEX) 1 MG tablet Take 1 tablet (1 mg total) by mouth daily. 02/14/15  Yes Patrici Ranks, MD  benzonatate (TESSALON PERLES) 100 MG capsule Take 2 capsules (200 mg  total) by mouth 3 (three) times daily as needed for cough. 02/15/15  Yes Patrici Ranks, MD  bisacodyl (DULCOLAX) 5 MG EC tablet Take 2 tablets (10 mg total) by mouth daily as needed for moderate constipation. 02/14/15  Yes Thurnell Lose, MD  Calcium-Vitamin D (CALTRATE 600 PLUS-VIT D PO) Take 1 tablet by mouth 2 (two) times daily.    Yes Historical Provider, MD  cholecalciferol (VITAMIN D) 1000 UNITS tablet Take 1,000 Units by mouth at bedtime.    Yes Historical Provider, MD  DOXOrubicin HCl Liposomal (DOXIL IV) Inject into the vein once a week.    Yes Historical Provider, MD  feeding supplement, ENSURE ENLIVE, (ENSURE ENLIVE) LIQD Take 237 mLs by mouth 2 (two) times daily between meals. 02/14/15  Yes Thurnell Lose, MD  ferrous sulfate 325 (65 FE) MG tablet Take 1 tablet (325 mg total) by mouth 2 (two) times daily with a meal. 02/14/15  Yes Thurnell Lose, MD  folic acid  (FOLVITE) 876 MCG tablet Take 800 mcg by mouth daily.    Yes Historical Provider, MD  loperamide (IMODIUM A-D) 2 MG tablet Take 2-4 mg by mouth 4 (four) times daily as needed for diarrhea or loose stools.   Yes Historical Provider, MD  LORazepam (ATIVAN) 2 MG/ML concentrated solution Take 0.5 mLs (1 mg total) by mouth every 6 (six) hours as needed for anxiety. 02/14/15  Yes Thurnell Lose, MD  methotrexate (RHEUMATREX) 2.5 MG tablet Take 10 mg by mouth 2 (two) times a week. Caution:Chemotherapy. Protect from light.  Pt to takes 2.'5mg'$  tabs- 4 tablets on Friday and 4 tablets on Saturday   Yes Historical Provider, MD  Morphine Sulfate (MORPHINE CONCENTRATE) 10 MG/0.5ML SOLN concentrated solution Take 0.5 mLs (10 mg total) by mouth every 3 (three) hours as needed for moderate pain or severe pain. 02/14/15  Yes Thurnell Lose, MD  Multiple Vitamin (MULTIVITAMIN) tablet Take 1 tablet by mouth daily.   Yes Historical Provider, MD  predniSONE (DELTASONE) 5 MG tablet Take 5 mg by mouth daily as needed (flare up).    Yes Historical Provider, MD  raloxifene (EVISTA) 60 MG tablet Take 60 mg by mouth daily.   Yes Historical Provider, MD  predniSONE (DELTASONE) 5 MG tablet Label  & dispense according to the schedule below. 10 Pills PO for 3 days then, 8 Pills PO for 3 days, 6 Pills PO for 3 days, 4 Pills PO for 3 days, 2 Pills PO for 3 days, 1 Pills PO for 3 days, 1/2 Pill  PO for 3 days then STOP. Total 95 pills. Patient not taking: Reported on 02/28/2015 02/14/15   Thurnell Lose, MD    Physical Exam: Filed Vitals:   02/27/15 0230 02/27/15 0300 02/27/15 0307 02/27/15 0424  BP: 112/79 117/76  107/57  Pulse: 115 113  109  Temp:    98.2 F (36.8 C)  TempSrc:    Oral  Resp: '21 17  23  '$ Height:    '4\' 11"'$  (1.499 m)  Weight:    51.755 kg (114 lb 1.6 oz)  SpO2: 98% 97% 96% 97%   General: Not in acute distress. Chronically-ill in appearance.  HEENT:       Eyes: PERRL, EOMI, no scleral icterus or conjunctival  pallor.       ENT: No discharge from the ears or nose, no pharyngeal ulcers, petechiae or exudate, no tonsillar enlargement.        Neck: No JVD, no bruit, no  appreciable mass Heme: No cervical adenopathy, no pallor Cardiac: Rate ~120 and regular, No murmurs, No gallops or rubs. Pulm: Markedly diminished on left, fine crackles on right.  Abd: Soft, nondistended, mild tenderness, no rebound pain or gaurding, BS present. Ext: No LE edema bilaterally. 2+DP/PT pulse bilaterally. Musculoskeletal: No gross deformity, no red, hot, swollen joints   Skin: No rashes or wounds on exposed surfaces  Neuro: Alert, oriented X3, cranial nerves II-XII grossly intact. No focal findings Psych: Patient is not overtly psychotic, appropriate mood and affect.  Labs on Admission:  Basic Metabolic Panel:  Recent Labs Lab 02/21/15 1006 02/16/2015 2239  NA 131* 129*  K 4.2 3.5  CL 96* 94*  CO2 27 25  GLUCOSE 189* 132*  BUN 13 15  CREATININE 0.50 0.60  CALCIUM 9.1 9.9   Liver Function Tests:  Recent Labs Lab 02/21/15 1006 02/06/2015 2239  AST 31 29  ALT 19 17  ALKPHOS 102 105  BILITOT 0.5 0.6  PROT 5.8* 5.6*  ALBUMIN 2.6* 2.4*    Recent Labs Lab 02/21/2015 2239  LIPASE 16   No results for input(s): AMMONIA in the last 168 hours. CBC:  Recent Labs Lab 02/21/15 1006 02/20/2015 1500 02/07/2015 2239  WBC 8.0 15.0* 12.9*  NEUTROABS 6.0 13.7* 10.7*  HGB 11.6* 12.8 11.6*  HCT 34.4* 38.5 34.5*  MCV 100.0 100.5* 99.7  PLT 260 284 230   Cardiac Enzymes:  Recent Labs Lab 02/06/2015 2341  TROPONINI <0.03    BNP (last 3 results)  Recent Labs  02/06/15 1918  BNP 61.0    ProBNP (last 3 results) No results for input(s): PROBNP in the last 8760 hours.  CBG: No results for input(s): GLUCAP in the last 168 hours.  Radiological Exams on Admission: Dg Chest 2 View  03/01/2015  CLINICAL DATA:  Hx VATS 02/08/2015, pleural effusion, sob, no smoke EXAM: CHEST  2 VIEW COMPARISON:  02/13/2015,  02/06/2015 FINDINGS: Heart mediastinal contents pushed toward the right side due to large left pleural effusion. Extensive opacity on the left likely reflects combination of atelectasis and known mass. Only a portion of the left upper lobe is aerated. Right mid lung zone masses again identified.  Stable Port-A-Cath. IMPRESSION: Known bilateral lung masses with large left pleural effusion. Electronically Signed   By: Skipper Cliche M.D.   On: 02/17/2015 14:32   Ct Angio Chest Pe W/cm &/or Wo Cm  02/27/2015  CLINICAL DATA:  Acute onset of black diarrhea, fever, nausea and worsening shortness of breath. Decreased appetite and generalized weakness. Current history of stage IV endometrial cancer, on chemotherapy. Initial encounter. EXAM: CT ANGIOGRAPHY CHEST CT ABDOMEN AND PELVIS WITH CONTRAST TECHNIQUE: Multidetector CT imaging of the chest was performed using the standard protocol during bolus administration of intravenous contrast. Multiplanar CT image reconstructions and MIPs were obtained to evaluate the vascular anatomy. Multidetector CT imaging of the abdomen and pelvis was performed using the standard protocol during bolus administration of intravenous contrast. CONTRAST:  111m OMNIPAQUE IOHEXOL 350 MG/ML SOLN COMPARISON:  Chest radiograph performed 02/25/2015, and CTA of the chest performed 02/06/2015 FINDINGS: CTA CHEST FINDINGS There is no evidence of pulmonary embolus. There is compression of the left main pulmonary artery by the patient's enlarging left-sided lung mass, though it remains patent. A rapidly enlarging left-sided lung mass is noted, measuring approximately 17.0 x 15.1 x 9.5 cm. This has increased in size from 8.3 x 3.9 cm in January. Increasing underlying loculated left-sided pleural fluid is noted, reflecting a malignant  pleural effusion. Underlying compressive atelectasis is noted. Right-sided lung nodules measure up to 2.1 cm in size, also increased in size. Trace right-sided pleural  fluid is noted, with associated atelectasis. There is no evidence of pneumothorax. A small amount of pericardial fluid is noted, reflecting infiltration of the mass into the left side of the mediastinum. Contrast and air are noted within the esophagus, likely reflecting some degree of esophageal dysmotility. No definite mediastinal lymphadenopathy is seen; mediastinal nodes were previously noted to be hypermetabolic, though still normal in size. The great vessels are grossly unremarkable in appearance. A right-sided chest port is noted. Clips are noted at the left axilla. No axillary lymphadenopathy is seen. The visualized portions of the thyroid gland are unremarkable in appearance. No acute osseous abnormalities are seen. CT ABDOMEN and PELVIS FINDINGS A hepatic cyst is again noted, difficult to fully assess due to motion artifact. The liver and spleen are otherwise unremarkable. The gallbladder is within normal limits. The pancreas and adrenal glands are unremarkable. A few tiny left renal cysts are noted. The kidneys are otherwise unremarkable. There is no evidence of hydronephrosis. No renal or ureteral stones are seen. No perinephric stranding is appreciated. No free fluid is identified. The small bowel is unremarkable in appearance. The stomach is within normal limits. No acute vascular abnormalities are seen. Scattered calcification is noted along the abdominal aorta and its branches. The appendix is normal in caliber, without evidence of appendicitis. Note is made of mild wall thickening along the descending and sigmoid colon, concerning for very mild colitis, either infectious or inflammatory in nature. The bladder is decompressed and not well assessed. The patient is status post hysterectomy. No suspicious adnexal masses are seen. No inguinal lymphadenopathy is seen. No acute osseous abnormalities are identified. Vacuum phenomenon is noted at L5-S1. Review of the MIP images confirms the above findings.  IMPRESSION: 1. No evidence of pulmonary embolus. 2. Rapidly enlarging left-sided lung mass, measuring 17.0 x 15.1 x 9.5 cm, increased in size from 8.3 x 3.9 cm in January, and from 3.6 x 2.8 cm in December. Increasing underlying loculated left-sided malignant pleural effusion seen, with associated compressive atelectasis. Findings compatible with markedly worsening metastatic disease. 3. Mild wall thickening along the descending and sigmoid colon, concerning for very mild colitis, either infectious or inflammatory in nature. 4. Underlying right-sided lung nodules also increased in size, now measuring up to 2.1 cm, with trace right-sided pleural fluid and atelectasis. 5. Compression of the left main pulmonary artery by the enlarging left-sided lung mass, though it remains patent. 6. Small amount of pericardial fluid reflects infiltration of the mass into the left side of the mediastinum. 7. Contrast and air within the esophagus, likely reflecting some degree of esophageal dysmotility. 8. Hepatic cyst again noted.  Few tiny left renal cyst seen. 9. Scattered calcification along the abdominal aorta and its branches. These results were called by telephone at the time of interpretation on 02/27/2015 at 2:32 am to Dr. Pryor Curia, who verbally acknowledged these results. Electronically Signed   By: Garald Balding M.D.   On: 02/27/2015 02:38   Ct Abdomen Pelvis W Contrast  02/27/2015  CLINICAL DATA:  Acute onset of black diarrhea, fever, nausea and worsening shortness of breath. Decreased appetite and generalized weakness. Current history of stage IV endometrial cancer, on chemotherapy. Initial encounter. EXAM: CT ANGIOGRAPHY CHEST CT ABDOMEN AND PELVIS WITH CONTRAST TECHNIQUE: Multidetector CT imaging of the chest was performed using the standard protocol during bolus administration of intravenous  contrast. Multiplanar CT image reconstructions and MIPs were obtained to evaluate the vascular anatomy. Multidetector CT  imaging of the abdomen and pelvis was performed using the standard protocol during bolus administration of intravenous contrast. CONTRAST:  152m OMNIPAQUE IOHEXOL 350 MG/ML SOLN COMPARISON:  Chest radiograph performed 03/05/2015, and CTA of the chest performed 02/06/2015 FINDINGS: CTA CHEST FINDINGS There is no evidence of pulmonary embolus. There is compression of the left main pulmonary artery by the patient's enlarging left-sided lung mass, though it remains patent. A rapidly enlarging left-sided lung mass is noted, measuring approximately 17.0 x 15.1 x 9.5 cm. This has increased in size from 8.3 x 3.9 cm in January. Increasing underlying loculated left-sided pleural fluid is noted, reflecting a malignant pleural effusion. Underlying compressive atelectasis is noted. Right-sided lung nodules measure up to 2.1 cm in size, also increased in size. Trace right-sided pleural fluid is noted, with associated atelectasis. There is no evidence of pneumothorax. A small amount of pericardial fluid is noted, reflecting infiltration of the mass into the left side of the mediastinum. Contrast and air are noted within the esophagus, likely reflecting some degree of esophageal dysmotility. No definite mediastinal lymphadenopathy is seen; mediastinal nodes were previously noted to be hypermetabolic, though still normal in size. The great vessels are grossly unremarkable in appearance. A right-sided chest port is noted. Clips are noted at the left axilla. No axillary lymphadenopathy is seen. The visualized portions of the thyroid gland are unremarkable in appearance. No acute osseous abnormalities are seen. CT ABDOMEN and PELVIS FINDINGS A hepatic cyst is again noted, difficult to fully assess due to motion artifact. The liver and spleen are otherwise unremarkable. The gallbladder is within normal limits. The pancreas and adrenal glands are unremarkable. A few tiny left renal cysts are noted. The kidneys are otherwise  unremarkable. There is no evidence of hydronephrosis. No renal or ureteral stones are seen. No perinephric stranding is appreciated. No free fluid is identified. The small bowel is unremarkable in appearance. The stomach is within normal limits. No acute vascular abnormalities are seen. Scattered calcification is noted along the abdominal aorta and its branches. The appendix is normal in caliber, without evidence of appendicitis. Note is made of mild wall thickening along the descending and sigmoid colon, concerning for very mild colitis, either infectious or inflammatory in nature. The bladder is decompressed and not well assessed. The patient is status post hysterectomy. No suspicious adnexal masses are seen. No inguinal lymphadenopathy is seen. No acute osseous abnormalities are identified. Vacuum phenomenon is noted at L5-S1. Review of the MIP images confirms the above findings. IMPRESSION: 1. No evidence of pulmonary embolus. 2. Rapidly enlarging left-sided lung mass, measuring 17.0 x 15.1 x 9.5 cm, increased in size from 8.3 x 3.9 cm in January, and from 3.6 x 2.8 cm in December. Increasing underlying loculated left-sided malignant pleural effusion seen, with associated compressive atelectasis. Findings compatible with markedly worsening metastatic disease. 3. Mild wall thickening along the descending and sigmoid colon, concerning for very mild colitis, either infectious or inflammatory in nature. 4. Underlying right-sided lung nodules also increased in size, now measuring up to 2.1 cm, with trace right-sided pleural fluid and atelectasis. 5. Compression of the left main pulmonary artery by the enlarging left-sided lung mass, though it remains patent. 6. Small amount of pericardial fluid reflects infiltration of the mass into the left side of the mediastinum. 7. Contrast and air within the esophagus, likely reflecting some degree of esophageal dysmotility. 8. Hepatic cyst again noted.  Few tiny left renal cyst  seen. 9. Scattered calcification along the abdominal aorta and its branches. These results were called by telephone at the time of interpretation on 02/27/2015 at 2:32 am to Dr. Pryor Curia, who verbally acknowledged these results. Electronically Signed   By: Garald Balding M.D.   On: 02/27/2015 02:38   Dg Chest Port 1 View  02/27/2015  CLINICAL DATA:  Fever and shortness of breath. EXAM: PORTABLE CHEST 1 VIEW COMPARISON:  02/16/2015, earlier the same day. FINDINGS: 2341 hours. Large left lung mass again noted with associated large pleural effusion. Surgical clips are noted in the region of the left axilla. These changes generate mass-effect on the mediastinum, displacing cardiomediastinal anatomy to the right, as before. Right-sided Port-A-Cath remains in place. Nodular density at the right lung base is compatible with the patient's known right-sided lung lesion. There is probably some atelectasis in the right midline today. Bone windows reveal no worrisome lytic or sclerotic osseous lesions. IMPRESSION: Near complete opacification of the left hemi thorax in this patient with known large left lung mass and large left pleural effusion. No substantial interval change when taking into account slight rotation and differential positioning on the current study. Electronically Signed   By: Misty Stanley M.D.   On: 02/27/2015 00:09    EKG: Ordered and pending  Assessment/Plan  1. Diarrhea with dehydration  - Uncertain etiology, C diff is a concern  - Maintain contact isolation for now with C diff assay pending  - Received 2 L NS bolus in ED, continuing with NS at 125 cc/hr  - Advance diet as tolerated    2. SIRS with concern for sepsis  - Tachycardic and tachypneic on arrival with leukocytosis to 12,900  - Received 2 L NS bolus in ED  - Potential sources include lung and colon  - Symptoms could also be explained by malignancy and chemotherapy effects  - Blood and urine cultures incubating  - Received  empiric vancomycin and Zosyn in ED  - Will hold off on additional abx for now as no convincing evidence of infectious process and pt stable at this time  - Follow-up cultures and C diff assay    3. Left lung mass, pleural effusion with dyspnea  - Rapid interval enlargement on mass noted on CT  - Thoracentesis may alleviate some of her dyspnea  - Will keep NPO for now and request IR thoracentesis in am   4. Anemia  - Secondary to chemotherapy and chronic disease  - Hgb 11.6 on admission, was reportedly transfused 2 units about 1 wk ago  - Had been treated with Neulasta  - Black diarrhea concerning for GI losses  - Protonix 40 mg IV BID for now in light of FOBT+ stool  - No pharmacologic VTE ppx, SCD only for now  - Monitor    5. Stage IV endometrial cancer  - Managed by Dr. 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 Whitney Muse     DVT ppx:  SCDs  Code Status: Full code Family Communication:  Yes, patient's sister and daughter at bed side Disposition Plan: Admit to inpatient   Date of Service 02/27/2015    Vianne Bulls, MD [facility] Pager [phone]  If 7PM-7AM, please contact night-coverage [email or web address] Password [facility] 02/27/2015, 5:09 AM

## 2015-02-27 NOTE — Progress Notes (Addendum)
Returned to room via wheelchair, assisted to bed, Dyann Kief in room consulting with patient and family. Thick gauze dressing intact to left upper back, no drainage present, will continue to monitor respiratory status and dressing.

## 2015-02-27 NOTE — Progress Notes (Signed)
Report called to ICU, being transported to Bed 10.  Transported via bed and oxygen at 4L portable tank.  SCD's with patient, informed ICU staff that stool for pathogen needed, on enteric precautions.

## 2015-02-27 NOTE — Progress Notes (Signed)
Patient is ordered to have SCDs on but there are no machines currently on the floor.  RN called nursing supervisor to request a machine.  RN waiting for supervisor to deliver the machine.

## 2015-02-27 NOTE — Progress Notes (Signed)
[facility]  Patient Status: Active (receiving services up to time of hospitalization)  [facility] is providing the following services: PT  If patient discharges after hours, please call [phone].   Romualdo Bolk 02/27/2015, 3:23 PM

## 2015-02-27 NOTE — Progress Notes (Signed)
Patient has been seen and examined. Admitted after midnight secondary to shortness of breath, abdominal pain/nausea and black diarrhea. Imaging studies have demonstrated pleural effusion (most likely malignant in nature). Patient with positive C. difficile antigen, but negative C. Diff toxin. Still with diarrhea and LLQ abd pain. No further nausea or vomiting at this time. Otherwise hemodynamically stable and afebrile. Please refer to H&P dictated by Dr. Myna Hidalgo for further info/details on admission.  Plan: -Patient no having any further episode of vomiting or nausea. Expressing to be mildly hungry. Will advance diet to full liquid. -Despite negative CT for toxin; given findings on her CT suggesting colitis and also complains of left lower quadrant abdominal pain with associated nausea and vomiting prior to admission, will treat with oral vancomycin for C. Difficile. -PPI has been switched to IV Pepcid -GI service has been consulted for further evaluation and treatment, given positive fecal occult blood test some black stools. -Will follow results of her thoracentesis; but feeling much better already and breathing easier after procedure. -Follow clinical response.  Barton Dubois [phone]

## 2015-02-27 NOTE — Progress Notes (Signed)
eLink Physician-Brief Progress Note Patient Name: Alejandra Callahan DOB: [birth year] MRN: [id number]   Date of Service  02/27/2015  HPI/Events of Note  Multiple issues: 1. HR = 135 (Sinus Tachycardia), 2. Request for Foley catheter.  eICU Interventions  Will order: 1. Change schedule Albuterol to Xopenex. 2. 0.9 NaCl 500 mL IV over 30 minutes now. 3. Albuterol Nebs Q 2 hours PRN (rescue). 4. Place Foley catheter.     Intervention Category Intermediate Interventions: Arrhythmia - evaluation and management;Oliguria - evaluation and management  Sommer,Steven Eugene 02/27/2015, 8:19 PM

## 2015-02-27 NOTE — Progress Notes (Signed)
Thoracentesis complete no signs of distress. 200 ml bloody colored pleural fluid removed.

## 2015-02-27 NOTE — Procedures (Signed)
PreOperative Dx: LT pleural effusion Postoperative Dx: LT pleural effusion Procedure:   US guided LT thoracentesis Radiologist:  Thornton Papas Anesthesia:  8 ml of 1% lidocaine Specimen:  200 ml of dark old bloody fluid EBL:   < 1 ml Complications: None

## 2015-02-27 NOTE — Progress Notes (Signed)
Patient is resting comfortably (no obvious or stated distress) Family is present in room. See vitasl flow-sheet for current.

## 2015-02-27 NOTE — ED Notes (Signed)
Pt up and assisted to bsc, pt assisted back to bed; urine specimen obtained and sent to lab

## 2015-02-27 NOTE — Progress Notes (Signed)
Called to room by secretary, patient having difficulty breathing, patient got patient up to bathroom aqnd patient became extrely SOB.  Repiratory to room for prn breathing treatment, continued to remain SOB with labored respirations, HR 138.  Ativan 1 mg given and Morphine 10 mg liquid given.  Dr. Dyann Kief on floor and notified of patient's status, came ;toroom and evaluated patient.  Solumedrol ordered ;and patient to be transferred ;to ICU.  [id number] ;orders.

## 2015-02-27 NOTE — Progress Notes (Signed)
Down via wheelchair for thoracentesis.  Meds and Tessalon given with sip of water for cough.  Transporter notified. IV saline locked for transport.

## 2015-02-28 ENCOUNTER — Inpatient Hospital Stay (HOSPITAL_COMMUNITY): Payer: Medicare Other

## 2015-02-28 ENCOUNTER — Encounter (HOSPITAL_COMMUNITY): Payer: Self-pay | Admitting: Gastroenterology

## 2015-02-28 ENCOUNTER — Ambulatory Visit (HOSPITAL_COMMUNITY): Payer: Medicare Other | Admitting: Oncology

## 2015-02-28 DIAGNOSIS — K529 Noninfective gastroenteritis and colitis, unspecified: Secondary | ICD-10-CM

## 2015-02-28 DIAGNOSIS — R195 Other fecal abnormalities: Secondary | ICD-10-CM

## 2015-02-28 DIAGNOSIS — J9621 Acute and chronic respiratory failure with hypoxia: Secondary | ICD-10-CM

## 2015-02-28 DIAGNOSIS — D6489 Other specified anemias: Secondary | ICD-10-CM

## 2015-02-28 LAB — CBC WITH DIFFERENTIAL/PLATELET
BASOS PCT: 0 %
Basophils Absolute: 0 10*3/uL (ref 0.0–0.1)
EOS ABS: 0.1 10*3/uL (ref 0.0–0.7)
EOS PCT: 1 %
HCT: 26.8 % — ABNORMAL LOW (ref 36.0–46.0)
Hemoglobin: 9 g/dL — ABNORMAL LOW (ref 12.0–15.0)
Lymphocytes Relative: 9 %
Lymphs Abs: 0.8 10*3/uL (ref 0.7–4.0)
MCH: 33.5 pg (ref 26.0–34.0)
MCHC: 33.6 g/dL (ref 30.0–36.0)
MCV: 99.6 fL (ref 78.0–100.0)
MONO ABS: 1 10*3/uL (ref 0.1–1.0)
MONOS PCT: 12 %
Neutro Abs: 6.9 10*3/uL (ref 1.7–7.7)
Neutrophils Relative %: 79 %
Platelets: 185 10*3/uL (ref 150–400)
RBC: 2.69 MIL/uL — ABNORMAL LOW (ref 3.87–5.11)
RDW: 16.9 % — AB (ref 11.5–15.5)
WBC: 8.8 10*3/uL (ref 4.0–10.5)

## 2015-02-28 LAB — PATHOLOGIST SMEAR REVIEW

## 2015-02-28 LAB — PROCALCITONIN: Procalcitonin: 0.17 ng/mL

## 2015-02-28 LAB — URINE CULTURE

## 2015-02-28 LAB — LACTIC ACID, PLASMA: Lactic Acid, Venous: 1.7 mmol/L (ref 0.5–2.0)

## 2015-02-28 LAB — GLUCOSE, CAPILLARY: GLUCOSE-CAPILLARY: 95 mg/dL (ref 65–99)

## 2015-02-28 MED ORDER — SODIUM CHLORIDE 0.9 % IV SOLN
INTRAVENOUS | Status: DC
  Administered 2015-02-28 – 2015-03-02 (×3): via INTRAVENOUS

## 2015-02-28 MED ORDER — PIPERACILLIN-TAZOBACTAM 3.375 G IVPB
3.3750 g | Freq: Three times a day (TID) | INTRAVENOUS | Status: DC
  Administered 2015-02-28 – 2015-03-02 (×8): 3.375 g via INTRAVENOUS
  Filled 2015-02-28 (×4): qty 50

## 2015-02-28 MED ORDER — ALBUTEROL SULFATE (2.5 MG/3ML) 0.083% IN NEBU
2.5000 mg | INHALATION_SOLUTION | RESPIRATORY_TRACT | Status: DC | PRN
Start: 2015-02-28 — End: 2015-03-04
  Administered 2015-03-01 – 2015-03-02 (×2): 2.5 mg via RESPIRATORY_TRACT
  Filled 2015-02-28 (×2): qty 3

## 2015-02-28 NOTE — Progress Notes (Signed)
[facility] PROGRESS NOTE  JULEE STOLL PFX:[id number] DOB: 1945/09/21 DOA: 02/13/2015 PCP: [name], MD  Assessment/Plan: 1-C. difficile colitis: Patient with left lower quadrant pain, nausea, vomiting, diarrhea and CT scan demonstrating colonic inflammation.  -C. Diffcile toxin was negative the patient has a positive C. difficile antigen -Keep in above symptoms at presentation the decision has been made to treat for C. difficile colitis. -Will avoid the use of PPIs while treating for CAD. -GI pathogen panel has been order follow recommendations by GI. -Diarrhea has subsided -Will continue supportive care and follow clinical response.  2-acute on chronic respiratory failure with hypoxia: Appears to be secondary to malignant pleural effusion and post obstructive pneumonia -Patient is status post thoracentesis -Fluid cultures are still pending; but laboratory work demonstrated an exudate in nature. -Patient started on Zosyn to cover for post obstructive pneumonia. -Will follow clinical response, continue nebulizer treatments, oxygen supplementation and supportive care.  3-sirs/sepsis: secondary to C. 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  Code Status: Full code Family  Communication: Daughter and son at bedside Disposition Plan: Will remain in the stepdown to guarantee a stability, continue IV Pepcid, continue treatment for C. difficile and also Zosyn for possible obstructive pneumonia.   Consultants:  GI service  Oncology service  Procedures:  See below for x-ray reports  Antibiotics:  Zosyn 2/23  Oral vancomycin 2/22  HPI/Subjective: With acute respiratory failure overnight recording to be transferred to stepdown unit. Afebrile, without chest pain and breathing slightly easier. Patient is in no distress and with oxygen saturation 96-97% on 4 L.  Objective: Filed Vitals:   02/28/15 0600 02/28/15 0811  BP: 112/66   Pulse: 116   Temp:  97.8 F (36.6 C)  Resp: 18     Intake/Output Summary (Last 24 hours) at 02/28/15 1638 Last data filed at 02/28/15 0500  Gross per 24 hour  Intake 2436.67 ml  Output   1100 ml  Net 1336.67 ml   Filed Weights   02/27/15 0424 02/27/15 2000 02/28/15 0500  Weight: 51.755 kg (114 lb 1.6 oz) 55.3 kg (121 lb 14.6 oz) 52.5 kg (115 lb 11.9 oz)    Exam:   General:  Afebrile, breathing a little bit better, without any further episode of diarrhea and no signs of overt bleeding. Patient overnight experience an acute respiratory failure episode that required her to be moved to the stepdown unit. He reports that she's is still having some shortness of breath is overall better and denies acute distress.  Cardiovascular: Sinus tachycardia, no rubs, no gallops, no JVD  Respiratory: Mild wheezing, scattered rhonchi, decreased breath sounds on her left lung field  Abdomen: Soft, mild tenderness left lower quadrant; positive bowel sounds; no guarding and no distention.  Musculoskeletal: No edema, no cyanosis  Data Reviewed: Basic Metabolic Panel:  Recent Labs Lab 03/02/2015 2239  NA 129*  K 3.5  CL 94*  CO2 25  GLUCOSE 132*  BUN 15  CREATININE 0.60  CALCIUM 9.9   Liver Function Tests:  Recent  Labs Lab 02/10/2015 2239  AST 29  ALT 17  ALKPHOS 105  BILITOT 0.6  PROT 5.6*  ALBUMIN 2.4*    Recent Labs Lab 02/20/2015 2239  LIPASE 16   CBC:  Recent Labs Lab 03/01/2015 1500 02/14/2015 2239 02/28/15 1049  WBC 15.0* 12.9* 8.8  NEUTROABS 13.7* 10.7* 6.9  HGB 12.8 11.6* 9.0*  HCT 38.5 34.5* 26.8*  MCV 100.5* 99.7 99.6  PLT 284 230 185   Cardiac Enzymes:  Recent Labs Lab 02/22/2015 2341  TROPONINI <0.03   BNP (last 3 results)  Recent Labs  02/06/15 1918  BNP 61.0   CBG:  Recent Labs Lab 02/27/15 0829 02/28/15 0803  GLUCAP 111* 95    Recent Results (from the past 240 hour(s))  Blood Culture (routine x 2)     Status: None (Preliminary result)   Collection Time: 02/22/2015 11:41 PM  Result Value Ref Range Status   Specimen Description BLOOD RIGHT ANTECUBITAL  Final   Special Requests   Final    BOTTLES DRAWN AEROBIC AND ANAEROBIC AEB=6CC ANA=5CC   Culture NO GROWTH 1 DAY  Final   Report Status PENDING  Incomplete  Blood Culture (routine x 2)     Status: None (Preliminary result)   Collection Time: 02/17/2015 11:59 PM  Result Value Ref Range Status   Specimen Description BLOOD PORTA CATH DRAWN BY RN RH  Final   Special Requests BOTTLES DRAWN AEROBIC AND ANAEROBIC 10CC EACH  Final   Culture NO GROWTH 1 DAY  Final   Report Status PENDING  Incomplete  Urine culture     Status: None   Collection Time: 02/27/15  1:25 AM  Result Value Ref Range Status   Specimen Description URINE, CLEAN CATCH  Final   Special Requests NONE  Final   Culture   Final    MULTIPLE SPECIES PRESENT, SUGGEST RECOLLECTION Performed at [facility]    Report Status 02/28/2015 FINAL  Final  C difficile quick scan w PCR reflex     Status: Abnormal   Collection Time: 02/27/15 11:01 AM  Result Value Ref Range Status   C Diff antigen POSITIVE (A) NEGATIVE Final   C Diff toxin NEGATIVE NEGATIVE Final   C Diff interpretation   Final    C. difficile present, but toxin not detected.  This indicates colonization. In most cases, this does not require treatment. If patient has signs and symptoms consistent with colitis, consider treatment. Requires ENTERIC precautions.  Culture, body fluid-bottle     Status: None (Preliminary result)   Collection Time: 02/27/15  1:32 PM  Result Value Ref Range Status   Specimen Description FLUID LEFT PLEURAL  Final   Special Requests BOTTLES DRAWN AEROBIC AND ANAEROBIC 10CC EACH  Final   Gram Stain   Final    FEW WBC PRESENT,BOTH PMN AND MONONUCLEAR NO ORGANISMS SEEN    Culture NO GROWTH < 24 HOURS  Final   Report Status PENDING  Incomplete  Gram stain     Status: None (Preliminary result)   Collection Time: 02/27/15  1:32 PM  Result Value Ref Range Status   Specimen Description FLUID LEFT PLEURAL  Final   Special Requests NONE  Final   Gram Stain PENDING  Incomplete   Report Status PENDING  Incomplete     Studies: Dg Chest 1 View  02/27/2015  CLINICAL DATA:  Endometrial cancer, LEFT pleural  effusion post thoracentesis EXAM: CHEST 1 VIEW COMPARISON:  CT chest 02/27/2015 FINDINGS: Subtotal opacification of LEFT hemithorax by combination of loculated pleural effusion and large tumor mass as seen on CT. No pneumothorax following LEFT thoracentesis. Mediastinal shift to the RIGHT. Atelectasis RIGHT lung. Post LEFT mastectomy and axillary node dissection. Bones demineralized. IMPRESSION: No pneumothorax following LEFT thoracentesis. Persistent significant opacification of LEFT hemithorax by combination of known large mass and large pleural effusion. Patient asymptomatic post thoracentesis. Electronically Signed   By: Lavonia Dana M.D.   On: 02/27/2015 14:13   Ct Angio Chest Pe W/cm &/or Wo Cm  02/27/2015  CLINICAL DATA:  Acute onset of black diarrhea, fever, nausea and worsening shortness of breath. Decreased appetite and generalized weakness. Current history of stage IV endometrial cancer, on chemotherapy. Initial encounter. EXAM: CT  ANGIOGRAPHY CHEST CT ABDOMEN AND PELVIS WITH CONTRAST TECHNIQUE: Multidetector CT imaging of the chest was performed using the standard protocol during bolus administration of intravenous contrast. Multiplanar CT image reconstructions and MIPs were obtained to evaluate the vascular anatomy. Multidetector CT imaging of the abdomen and pelvis was performed using the standard protocol during bolus administration of intravenous contrast. CONTRAST:  167m OMNIPAQUE IOHEXOL 350 MG/ML SOLN COMPARISON:  Chest radiograph performed 02/14/2015, and CTA of the chest performed 02/06/2015 FINDINGS: CTA CHEST FINDINGS There is no evidence of pulmonary embolus. There is compression of the left main pulmonary artery by the patient's enlarging left-sided lung mass, though it remains patent. A rapidly enlarging left-sided lung mass is noted, measuring approximately 17.0 x 15.1 x 9.5 cm. This has increased in size from 8.3 x 3.9 cm in January. Increasing underlying loculated left-sided pleural fluid is noted, reflecting a malignant pleural effusion. Underlying compressive atelectasis is noted. Right-sided lung nodules measure up to 2.1 cm in size, also increased in size. Trace right-sided pleural fluid is noted, with associated atelectasis. There is no evidence of pneumothorax. A small amount of pericardial fluid is noted, reflecting infiltration of the mass into the left side of the mediastinum. Contrast and air are noted within the esophagus, likely reflecting some degree of esophageal dysmotility. No definite mediastinal lymphadenopathy is seen; mediastinal nodes were previously noted to be hypermetabolic, though still normal in size. The great vessels are grossly unremarkable in appearance. A right-sided chest port is noted. Clips are noted at the left axilla. No axillary lymphadenopathy is seen. The visualized portions of the thyroid gland are unremarkable in appearance. No acute osseous abnormalities are seen. CT ABDOMEN and  PELVIS FINDINGS A hepatic cyst is again noted, difficult to fully assess due to motion artifact. The liver and spleen are otherwise unremarkable. The gallbladder is within normal limits. The pancreas and adrenal glands are unremarkable. A few tiny left renal cysts are noted. The kidneys are otherwise unremarkable. There is no evidence of hydronephrosis. No renal or ureteral stones are seen. No perinephric stranding is appreciated. No free fluid is identified. The small bowel is unremarkable in appearance. The stomach is within normal limits. No acute vascular abnormalities are seen. Scattered calcification is noted along the abdominal aorta and its branches. The appendix is normal in caliber, without evidence of appendicitis. Note is made of mild wall thickening along the descending and sigmoid colon, concerning for very mild colitis, either infectious or inflammatory in nature. The bladder is decompressed and not well assessed. The patient is status post hysterectomy. No suspicious adnexal masses are seen. No inguinal lymphadenopathy is seen. No acute osseous abnormalities are identified. Vacuum phenomenon is noted  at L5-S1. Review of the MIP images confirms the above findings. IMPRESSION: 1. No evidence of pulmonary embolus. 2. Rapidly enlarging left-sided lung mass, measuring 17.0 x 15.1 x 9.5 cm, increased in size from 8.3 x 3.9 cm in January, and from 3.6 x 2.8 cm in December. Increasing underlying loculated left-sided malignant pleural effusion seen, with associated compressive atelectasis. Findings compatible with markedly worsening metastatic disease. 3. Mild wall thickening along the descending and sigmoid colon, concerning for very mild colitis, either infectious or inflammatory in nature. 4. Underlying right-sided lung nodules also increased in size, now measuring up to 2.1 cm, with trace right-sided pleural fluid and atelectasis. 5. Compression of the left main pulmonary artery by the enlarging  left-sided lung mass, though it remains patent. 6. Small amount of pericardial fluid reflects infiltration of the mass into the left side of the mediastinum. 7. Contrast and air within the esophagus, likely reflecting some degree of esophageal dysmotility. 8. Hepatic cyst again noted.  Few tiny left renal cyst seen. 9. Scattered calcification along the abdominal aorta and its branches. These results were called by telephone at the time of interpretation on 02/27/2015 at 2:32 am to Dr. Pryor Curia, who verbally acknowledged these results. Electronically Signed   By: Garald Balding M.D.   On: 02/27/2015 02:38   Ct Abdomen Pelvis W Contrast  02/27/2015  CLINICAL DATA:  Acute onset of black diarrhea, fever, nausea and worsening shortness of breath. Decreased appetite and generalized weakness. Current history of stage IV endometrial cancer, on chemotherapy. Initial encounter. EXAM: CT ANGIOGRAPHY CHEST CT ABDOMEN AND PELVIS WITH CONTRAST TECHNIQUE: Multidetector CT imaging of the chest was performed using the standard protocol during bolus administration of intravenous contrast. Multiplanar CT image reconstructions and MIPs were obtained to evaluate the vascular anatomy. Multidetector CT imaging of the abdomen and pelvis was performed using the standard protocol during bolus administration of intravenous contrast. CONTRAST:  189m OMNIPAQUE IOHEXOL 350 MG/ML SOLN COMPARISON:  Chest radiograph performed 02/13/2015, and CTA of the chest performed 02/06/2015 FINDINGS: CTA CHEST FINDINGS There is no evidence of pulmonary embolus. There is compression of the left main pulmonary artery by the patient's enlarging left-sided lung mass, though it remains patent. A rapidly enlarging left-sided lung mass is noted, measuring approximately 17.0 x 15.1 x 9.5 cm. This has increased in size from 8.3 x 3.9 cm in January. Increasing underlying loculated left-sided pleural fluid is noted, reflecting a malignant pleural effusion.  Underlying compressive atelectasis is noted. Right-sided lung nodules measure up to 2.1 cm in size, also increased in size. Trace right-sided pleural fluid is noted, with associated atelectasis. There is no evidence of pneumothorax. A small amount of pericardial fluid is noted, reflecting infiltration of the mass into the left side of the mediastinum. Contrast and air are noted within the esophagus, likely reflecting some degree of esophageal dysmotility. No definite mediastinal lymphadenopathy is seen; mediastinal nodes were previously noted to be hypermetabolic, though still normal in size. The great vessels are grossly unremarkable in appearance. A right-sided chest port is noted. Clips are noted at the left axilla. No axillary lymphadenopathy is seen. The visualized portions of the thyroid gland are unremarkable in appearance. No acute osseous abnormalities are seen. CT ABDOMEN and PELVIS FINDINGS A hepatic cyst is again noted, difficult to fully assess due to motion artifact. The liver and spleen are otherwise unremarkable. The gallbladder is within normal limits. The pancreas and adrenal glands are unremarkable. A few tiny left renal cysts are noted. The kidneys  are otherwise unremarkable. There is no evidence of hydronephrosis. No renal or ureteral stones are seen. No perinephric stranding is appreciated. No free fluid is identified. The small bowel is unremarkable in appearance. The stomach is within normal limits. No acute vascular abnormalities are seen. Scattered calcification is noted along the abdominal aorta and its branches. The appendix is normal in caliber, without evidence of appendicitis. Note is made of mild wall thickening along the descending and sigmoid colon, concerning for very mild colitis, either infectious or inflammatory in nature. The bladder is decompressed and not well assessed. The patient is status post hysterectomy. No suspicious adnexal masses are seen. No inguinal lymphadenopathy  is seen. No acute osseous abnormalities are identified. Vacuum phenomenon is noted at L5-S1. Review of the MIP images confirms the above findings. IMPRESSION: 1. No evidence of pulmonary embolus. 2. Rapidly enlarging left-sided lung mass, measuring 17.0 x 15.1 x 9.5 cm, increased in size from 8.3 x 3.9 cm in January, and from 3.6 x 2.8 cm in December. Increasing underlying loculated left-sided malignant pleural effusion seen, with associated compressive atelectasis. Findings compatible with markedly worsening metastatic disease. 3. Mild wall thickening along the descending and sigmoid colon, concerning for very mild colitis, either infectious or inflammatory in nature. 4. Underlying right-sided lung nodules also increased in size, now measuring up to 2.1 cm, with trace right-sided pleural fluid and atelectasis. 5. Compression of the left main pulmonary artery by the enlarging left-sided lung mass, though it remains patent. 6. Small amount of pericardial fluid reflects infiltration of the mass into the left side of the mediastinum. 7. Contrast and air within the esophagus, likely reflecting some degree of esophageal dysmotility. 8. Hepatic cyst again noted.  Few tiny left renal cyst seen. 9. Scattered calcification along the abdominal aorta and its branches. These results were called by telephone at the time of interpretation on 02/27/2015 at 2:32 am to Dr. Pryor Curia, who verbally acknowledged these results. Electronically Signed   By: Garald Balding M.D.   On: 02/27/2015 02:38   Dg Chest 1v Repeat Same Day  02/27/2015  CLINICAL DATA:  Some worsening in shortness of breath and night. Known lung mass. History of breast cancer. Initial encounter. EXAM: CHEST - 1 VIEW SAME DAY COMPARISON:  Single view of the chest earlier today and 02/27/2015. CT chest 02/27/2015. FINDINGS: There is increased airspace opacity in left upper lung zone since the study earlier today likely due to increased pleural fluid and airspace  disease in this patient with a large left chest mass. There is patchy airspace disease in the right lung which is unchanged in appearance. The heart is displaced to the right. Port-A-Cath is again noted. The patient is status post left mastectomy and axillary dissection. IMPRESSION: Some increase in opacity in the left upper lung zone since the examination earlier today compatible with increased pleural effusion and airspace disease in this patient with a large left chest mass. Electronically Signed   By: Inge Rise M.D.   On: 02/27/2015 19:04   Dg Chest Port 1 View  02/27/2015  CLINICAL DATA:  Fever and shortness of breath. EXAM: PORTABLE CHEST 1 VIEW COMPARISON:  02/23/2015, earlier the same day. FINDINGS: 2341 hours. Large left lung mass again noted with associated large pleural effusion. Surgical clips are noted in the region of the left axilla. These changes generate mass-effect on the mediastinum, displacing cardiomediastinal anatomy to the right, as before. Right-sided Port-A-Cath remains in place. Nodular density at the right lung base is  compatible with the patient's known right-sided lung lesion. There is probably some atelectasis in the right midline today. Bone windows reveal no worrisome lytic or sclerotic osseous lesions. IMPRESSION: Near complete opacification of the left hemi thorax in this patient with known large left lung mass and large left pleural effusion. No substantial interval change when taking into account slight rotation and differential positioning on the current study. Electronically Signed   By: Misty Stanley M.D.   On: 02/27/2015 00:09   US Thoracentesis Asp Pleural Space W/img Guide  02/27/2015  INDICATION: LEFT pleural effusion, large LEFT lung mass, history breast cancer, endometrial cancer EXAM: ULTRASOUND GUIDED LEFT THORACENTESIS PROCEDURE: Procedure, benefits, and risks of procedure were discussed with patient. Written informed consent for procedure was obtained.  Time out protocol followed. Pleural effusion localized by ultrasound at the posterior LEFT hemithorax. Fluid contains numerous internal echoes and appears complex. Skin prepped and draped in usual sterile fashion. Skin and soft tissues anesthetized with 8 mL of 1% lidocaine. 8 French thoracentesis catheter placed into the LEFT pleural space. 200 mL of dark old grossly bloody fluid was aspirated by syringe pump. At this point, flow fluid ceased and could not be restarted likely due to the complicated nature of the fluid and question loculation. Procedure tolerated well by patient without immediate complication. FINDINGS: A total of approximately 200 mL of bloody LEFT pleural fluid was removed. A fluid sample of 180 mL was sent for laboratory analysis. IMPRESSION: Successful ultrasound guided LEFT thoracentesis yielding 200 mL of pleural fluid. Electronically Signed   By: Lavonia Dana M.D.   On: 02/27/2015 14:10    Scheduled Meds: . anastrozole  1 mg Oral Daily  . budesonide (PULMICORT) nebulizer solution  0.25 mg Nebulization BID  . cholecalciferol  1,000 Units Oral QHS  . famotidine (PEPCID) IV  20 mg Intravenous Q12H  . feeding supplement (ENSURE ENLIVE)  237 mL Oral BID BM  . ferrous sulfate  325 mg Oral BID WC  . folic acid  1 mg Oral Daily  . levalbuterol  0.63 mg Nebulization 4 times per day  . multivitamin with minerals  1 tablet Oral Daily  . piperacillin-tazobactam (ZOSYN)  IV  3.375 g Intravenous 3 times per day  . raloxifene  60 mg Oral Daily  . sodium chloride flush  3 mL Intravenous Q12H  . 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 If 7PM-7AM, please contact night-coverage at [email or web address], password [facility] 02/28/2015, 4:38 PM  LOS: 1 day

## 2015-02-28 NOTE — Progress Notes (Signed)
Labs drawn for Phlebotomist via [place]. Patient is resting quietly in bed.  Family at bedside. Will continue to monitor.

## 2015-02-28 NOTE — Consult Note (Signed)
Referring Provider: Barton Dubois, MD Primary Care Physician:  [name], MD Primary Gastroenterologist:  Garfield Cornea, MD  Reason for Consultation:  +fobt, black stools  HPI: Alejandra Callahan is a 70 y.o. 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 She has history of large left-sided lung mass with associated effusion. Per her report diarrhea has been black. Her sister states is dark green. It was heme positive. She had blood transfusion last week. Family was concerned she may be losing blood.  In the emergency department chest x-ray demonstrated near complete opacification of the left hemithorax. CT abdomen, pelvis, angina chest as outlined below. Compression of left main pulmonary artery by enlarging left-sided lung mass although remains patent, left-sided lung mass measuring 17 x 15 x 9.5 cm nearly doubling in size since January. Evidence of malignant pleural effusion. Mild wall thickening along the descending and sigmoid colon, concerning for very mild colitis either infectious or inflammatory. Approximately 200 mL of bloody left for fluid removed yesterday via thoracentesis. Follow-up chest x-ray with increased opacity in the left upper lung zone consistent with increased pleural effusion and airspace disease.  C. difficile quick scan shows positive antigen, negative toxin.  Last chemotherapy on 02/21/2015.   Patient denies abdominal pain. Denies brbpr.  No diarrhea since yesterday.    Prior to Admission medications   Medication Sig Start Date End Date Taking? Authorizing Provider  acetaminophen (TYLENOL) 500 MG tablet Take 500 mg by mouth every 6 (six) hours as needed for mild pain or moderate pain.   Yes Historical Provider, MD  albuterol (PROVENTIL) (2.5 MG/3ML) 0.083% nebulizer solution Take  3 mLs (2.5 mg total) by nebulization every 4 (four) hours as needed for wheezing. 02/14/15  Yes Thurnell Lose, MD  alendronate (FOSAMAX) 70 MG tablet Take 70 mg by mouth once a week. Take with a full glass of water on an empty stomach. Monday   Yes Historical Provider, MD  anastrozole (ARIMIDEX) 1 MG tablet Take 1 tablet (1 mg total) by mouth daily. 02/14/15  Yes Patrici Ranks, MD  benzonatate (TESSALON PERLES) 100 MG capsule Take 2 capsules (200 mg total) by mouth 3 (three) times daily as needed for cough. 02/15/15  Yes Patrici Ranks, MD  bisacodyl (DULCOLAX) 5 MG EC tablet Take 2 tablets (10 mg total) by mouth daily as needed for moderate constipation. 02/14/15  Yes Thurnell Lose, MD  Calcium-Vitamin D (CALTRATE 600 PLUS-VIT D PO) Take 1 tablet by mouth 2 (two) times daily.    Yes Historical Provider, MD  cholecalciferol (VITAMIN D) 1000 UNITS tablet Take 1,000 Units by mouth at bedtime.    Yes Historical Provider, MD  DOXOrubicin HCl Liposomal (DOXIL IV) Inject into the vein once a week.    Yes Historical Provider, MD  feeding supplement, ENSURE ENLIVE, (ENSURE ENLIVE) LIQD Take 237 mLs by mouth 2 (two) times daily between meals. 02/14/15  Yes Thurnell Lose, MD  ferrous sulfate 325 (65 FE) MG tablet Take 1 tablet (325 mg total) by mouth 2 (two) times daily with a meal. 02/14/15  Yes Thurnell Lose, MD  folic acid (FOLVITE) 509 MCG tablet Take 800 mcg by mouth daily.    Yes Historical Provider, MD  loperamide (IMODIUM A-D) 2 MG tablet Take 2-4 mg by mouth 4 (four) times daily as needed for diarrhea or loose  stools.   Yes Historical Provider, MD  LORazepam (ATIVAN) 2 MG/ML concentrated solution Take 0.5 mLs (1 mg total) by mouth every 6 (six) hours as needed for anxiety. 02/14/15  Yes Thurnell Lose, MD  methotrexate (RHEUMATREX) 2.5 MG tablet Take 10 mg by mouth 2 (two) times a week. Caution:Chemotherapy. Protect from light.  Pt to takes 2.'5mg'$  tabs- 4 tablets on Friday and 4 tablets on  Saturday   Yes Historical Provider, MD  Morphine Sulfate (MORPHINE CONCENTRATE) 10 MG/0.5ML SOLN concentrated solution Take 0.5 mLs (10 mg total) by mouth every 3 (three) hours as needed for moderate pain or severe pain. 02/14/15  Yes Thurnell Lose, MD  Multiple Vitamin (MULTIVITAMIN) tablet Take 1 tablet by mouth daily.   Yes Historical Provider, MD  predniSONE (DELTASONE) 5 MG tablet Take 5 mg by mouth daily as needed (flare up).    Yes Historical Provider, MD  raloxifene (EVISTA) 60 MG tablet Take 60 mg by mouth daily.   Yes Historical Provider, MD  predniSONE (DELTASONE) 5 MG tablet Label  & dispense according to the schedule below. 10 Pills PO for 3 days then, 8 Pills PO for 3 days, 6 Pills PO for 3 days, 4 Pills PO for 3 days, 2 Pills PO for 3 days, 1 Pills PO for 3 days, 1/2 Pill  PO for 3 days then STOP. Total 95 pills. Patient not taking: Reported on 03/05/2015 02/14/15   Thurnell Lose, MD    Current Facility-Administered Medications  Medication Dose Route Frequency Provider Last Rate Last Dose  . acetaminophen (TYLENOL) tablet 500 mg  500 mg Oral Q6H PRN Ilene Qua Opyd, MD      . albuterol (PROVENTIL) (2.5 MG/3ML) 0.083% nebulizer solution 2.5 mg  2.5 mg Nebulization Q2H PRN Anders Simmonds, MD      . anastrozole (ARIMIDEX) tablet 1 mg  1 mg Oral Daily Vianne Bulls, MD   1 mg at 02/27/15 1100  . benzonatate (TESSALON) capsule 200 mg  200 mg Oral TID PRN Vianne Bulls, MD   200 mg at 02/27/15 1610  . budesonide (PULMICORT) nebulizer solution 0.25 mg  0.25 mg Nebulization BID Barton Dubois, MD   0.25 mg at 02/27/15 2005  . cholecalciferol (VITAMIN D) tablet 1,000 Units  1,000 Units Oral QHS Vianne Bulls, MD   1,000 Units at 02/27/15 2221  . famotidine (PEPCID) IVPB 20 mg premix  20 mg Intravenous Q12H Barton Dubois, MD   20 mg at 02/27/15 2221  . feeding supplement (ENSURE ENLIVE) (ENSURE ENLIVE) liquid 237 mL  237 mL Oral BID BM Ilene Qua Opyd, MD   237 mL at 02/27/15 1000  .  ferrous sulfate tablet 325 mg  325 mg Oral BID WC Vianne Bulls, MD   325 mg at 02/27/15 1737  . folic acid (FOLVITE) tablet 1 mg  1 mg Oral Daily Vianne Bulls, MD   1 mg at 02/27/15 1100  . levalbuterol (XOPENEX) nebulizer solution 0.63 mg  0.63 mg Nebulization 4 times per day Anders Simmonds, MD   0.63 mg at 02/28/15 9604  . LORazepam (ATIVAN) tablet 1 mg  1 mg Oral Q6H PRN Vianne Bulls, MD   1 mg at 02/27/15 5409  . morphine CONCENTRATE 10 MG/0.5ML oral solution 10 mg  10 mg Oral Q3H PRN Vianne Bulls, MD   10 mg at 02/27/15 1822  . multivitamin with minerals tablet 1 tablet  1 tablet Oral Daily Ilene Qua  Opyd, MD   1 tablet at 02/27/15 1100  . ondansetron (ZOFRAN) tablet 4 mg  4 mg Oral Q6H PRN Vianne Bulls, MD       Or  . ondansetron (ZOFRAN) injection 4 mg  4 mg Intravenous Q6H PRN Vianne Bulls, MD      . raloxifene (EVISTA) tablet 60 mg  60 mg Oral Daily Vianne Bulls, MD   60 mg at 02/27/15 1100  . sodium chloride flush (NS) 0.9 % injection 3 mL  3 mL Intravenous Q12H Ilene Qua Opyd, MD   3 mL at 02/27/15 2200  . vancomycin (VANCOCIN) 50 mg/mL oral solution 125 mg  125 mg Oral 4 times per day Barton Dubois, MD   125 mg at 02/28/15 0600    Allergies as of 02/16/2015 - Review Complete 02/16/2015  Allergen Reaction Noted  . Codeine Other (See Comments) 12/10/2011  . Acyclovir and related Swelling, Dermatitis, and Rash 08/06/2014  . Other  06/06/2013  . Tuberculin ppd Other (See Comments) 02/01/2014  . Tylox [oxycodone-acetaminophen] Other (See Comments) 06/06/2013    Past Medical History  Diagnosis Date  . Cancer (HCC)     breast  . Arthritis     Rheumatoid arthritis  . PONV (postoperative nausea and vomiting) 1982  . Breast cancer ([place]) 09/1980    left unilateral mastectomy, no chemo or radiatioin  . Endometrial cancer ([place]) 2014    mets to the lung  . Lung mass   . Pneumonia     hx    Past Surgical History  Procedure Laterality Date  . Mastectomy Left 09/1980   . Tubal ligation  April 1982  . Dilatation & currettage/hysteroscopy with resectocope N/A 11/18/2012    Procedure: DILATATION & CURETTAGE/HYSTEROSCOPY WITH RESECTOCOPE;  Surgeon: Marvene Staff, MD;  Location: [facility];  Service: Gynecology;  Laterality: N/A;  . Breast biopsy Left 09/11/80  . Vaginal hysterectomy  12/09/2012    Complete hysterectomy - [place] placement Right 8/16  . Abdominal hysterectomy    . Lung biopsy  01/18/15  . Video bronchoscopy with endobronchial navigation N/A 02/04/2015    Procedure: VIDEO BRONCHOSCOPY WITH ENDOBRONCHIAL NAVIGATION;  Surgeon: [name], MD;  Location: [facility];  Service: Thoracic;  Laterality: N/A;  . Video assisted thoracoscopy Left 02/08/2015    Procedure: VIDEO ASSISTED THORACOSCOPY, lung biopsy;  Surgeon: [name], MD;  Location: [facility];  Service: Thoracic;  Laterality: Left;  . Pleural effusion drainage Left 02/08/2015    Procedure: DRAINAGE OF PLEURAL EFFUSION;  Surgeon: [name], MD;  Location: [facility];  Service: Thoracic;  Laterality: Left;    Family History  Problem Relation Age of Onset  . Breast cancer Mother 77  . Heart attack Mother 60  . Colon cancer Father 28  . Heart attack Father 41  . Breast cancer Sister 70  . Heart attack Paternal Grandfather 38  . Breast cancer Other     mother's paternal first cousin dx in late 40s-early 28s    Social History   Social History  . Marital Status: Married    Spouse Name: N/A  . Number of Children: 2  . Years of Education: N/A   Occupational History  . Not on file.   Social History Main Topics  . Smoking status: Never Smoker   . Smokeless tobacco: Never Used  . Alcohol Use: No  . Drug Use: No  . Sexual Activity: Not on file   Other  Topics Concern  . Not on file   Social History Narrative     ROS:  General: no fever. +weakness, anorexia Eyes: Negative for vision changes.  ENT: Negative for hoarseness, difficulty swallowing  , nasal congestion. CV: Negative for chest pain, angina, palpitations,   peripheral edema. +DOE Respiratory: +dyspnea  GI: See history of present illness. GU:  Negative for dysuria, hematuria, urinary incontinence, urinary frequency, nocturnal urination.  MS: Negative for joint pain, low back pain.  Derm: Negative for rash or itching.  Neuro: Negative for weakness, abnormal sensation, seizure, frequent headaches, memory loss, confusion.  Psych: Negative for anxiety, depression, suicidal ideation, hallucinations.  Endo: Negative for unusual weight change.  Heme: Negative for bruising or bleeding. Allergy: Negative for rash or hives.       Physical Examination: Vital signs in last 24 hours: Temp:  [97.9 F (36.6 C)-99.7 F (37.6 C)] 97.9 F (36.6 C) (02/23 0400) Pulse Rate:  [115-136] 116 (02/23 0600) Resp:  [18-29] 18 (02/23 0600) BP: (94-123)/(53-70) 112/66 mmHg (02/23 0600) SpO2:  [93 %-99 %] 96 % (02/23 0615) FiO2 (%):  [36 %] 36 % (02/22 2200) Weight:  [115 lb 11.9 oz (52.5 kg)-121 lb 14.6 oz (55.3 kg)] 115 lb 11.9 oz (52.5 kg) (02/23 0500) Last BM Date: 02/06/2015  General: Chronically ill-appearing, no acute distress. Accompanied by multiple family members, 2 sisters, college roommate, daughter Head: Normocephalic, atraumatic.   Eyes: Conjunctiva pink, no icterus. Mouth: Oropharyngeal mucosa moist and pink , no lesions erythema or exudate. Neck: Supple without thyromegaly, masses, or lymphadenopathy.  Lungs: Diminished breath sounds on the left, clear breath sounds on the right Heart: Regular rate and rhythm, no murmurs rubs or gallops.  Abdomen: Bowel sounds are normal, nontender, nondistended, no hepatosplenomegaly or masses, no abdominal bruits or    hernia , no rebound or guarding.   Rectal: Not performed Extremities: No lower extremity edema, clubbing, deformity.  Neuro: Alert and oriented x 4 , grossly normal neurologically.  Skin: Warm and dry, no rash or jaundice.    Psych: Alert and cooperative, normal mood and affect.        Intake/Output from previous day: 02/22 0701 - 02/23 0700 In: 2439.7 [P.O.:120; I.V.:2219.7; IV Piggyback:100] Out: 1100 [Urine:1100] Intake/Output this shift:    Lab Results: CBC  Recent Labs  02/24/2015 1500 02/07/2015 2239  WBC 15.0* 12.9*  HGB 12.8 11.6*  HCT 38.5 34.5*  MCV 100.5* 99.7  PLT 284 230   BMET  Recent Labs  02/14/2015 2239  NA 129*  K 3.5  CL 94*  CO2 25  GLUCOSE 132*  BUN 15  CREATININE 0.60  CALCIUM 9.9   LFT  Recent Labs  02/20/2015 2239  BILITOT 0.6  ALKPHOS 105  AST 29  ALT 17  PROT 5.6*  ALBUMIN 2.4*    Lipase  Recent Labs  03/02/2015 2239  LIPASE 16    PT/INR  Recent Labs  02/27/15 0633  LABPROT 16.0*  INR 1.26      Imaging Studies: Dg Chest 1 View  02/27/2015  CLINICAL DATA:  Endometrial cancer, LEFT pleural effusion post thoracentesis EXAM: CHEST 1 VIEW COMPARISON:  CT chest 02/27/2015 FINDINGS: Subtotal opacification of LEFT hemithorax by combination of loculated pleural effusion and large tumor mass as seen on CT. No pneumothorax following LEFT thoracentesis. Mediastinal shift to the RIGHT. Atelectasis RIGHT lung. Post LEFT mastectomy and axillary node dissection. Bones demineralized. IMPRESSION: No pneumothorax following LEFT thoracentesis. Persistent significant opacification of LEFT hemithorax by combination of  known large mass and large pleural effusion. Patient asymptomatic post thoracentesis. Electronically Signed   By: Lavonia Dana M.D.   On: 02/27/2015 14:13   Dg Chest 2 View  02/24/2015  CLINICAL DATA:  Hx VATS 02/08/2015, pleural effusion, sob, no smoke EXAM: CHEST  2 VIEW COMPARISON:  02/13/2015, 02/06/2015 FINDINGS: Heart mediastinal contents pushed toward the right side due to large left pleural effusion. Extensive opacity on the left likely reflects combination of atelectasis and known mass. Only a portion of the left upper lobe is aerated. Right mid lung  zone masses again identified.  Stable Port-A-Cath. IMPRESSION: Known bilateral lung masses with large left pleural effusion. Electronically Signed   By: Skipper Cliche M.D.   On: 02/25/2015 14:32   Dg Chest 2 View  02/13/2015  CLINICAL DATA:  Pleural effusion.  Shortness of breath and cough. EXAM: CHEST  2 VIEW COMPARISON:  02/12/2015 FINDINGS: Right jugular Port-A-Cath remains in place with tip overlying the lower SVC. Masses are again seen along the left heart and mediastinal border. Small left pneumothorax is unchanged. There is a small left pleural effusion. Mild right basilar atelectasis is unchanged. IMPRESSION: 1. Unchanged, small left pneumothorax. 2. Small left pleural effusion. 3. Similar appearance of masses along the left heart border. Electronically Signed   By: Logan Bores M.D.   On: 02/13/2015 08:01   Dg Chest 2 View  02/12/2015  CLINICAL DATA:  Lung tumor.  Chemotherapy. EXAM: CHEST  2 VIEW COMPARISON:  02/11/2015.  CT 02/06/2015. FINDINGS: Port-A-Cath in stable position. Large left lung mass unchanged. Right lower lobe pulmonary nodules best demonstrated by CT. Left pleural effusion. Tiny residual left sided pneumothorax, improved from prior exam. Left mastectomy. Surgical clips left axilla. No acute bony abnormality. IMPRESSION: 1. Port-A-Cath in stable position. 2. Tiny residual left pneumothorax, improved from prior exam. 3. Persistent large left lung mass, unchanged. Persistent left pleural effusion, unchanged. Pulmonary nodules previously identified in the right lung base are best demonstrated by prior CT. Electronically Signed   By: Marcello Moores  Register   On: 02/12/2015 08:04   Dg Chest 2 View  02/06/2015  CLINICAL DATA:  70 year old female with increasing shortness of breath. History of lung biopsy 2 days ago. Nonproductive cough for the past 7 months. EXAM: CHEST  2 VIEW COMPARISON:  Chest x-ray 02/04/2014. FINDINGS: Large left pleural effusion. Multiple known pleural-based masses in the  left hemithorax are difficult to discretely visualize on today's examination secondary to the large left pleural effusion (see chest CT 01/24/2015 for further details). Right lung is clear. No right pleural effusion. Small right-sided pneumothorax, slightly decreased in size compared with yesterday's examination. No evidence of pulmonary edema. Cardiac silhouette is largely obscured, but heart size appears normal. Mediastinal contours are distorted by patient's positioning. Atherosclerosis in the thoracic aorta. Right internal jugular single-lumen porta cath with tip terminating at the superior cavoatrial junction. IMPRESSION: 1. Slight decrease in pneumothorax component of left-sided hydropneumothorax compared to yesterday's examination. The appearance the chest is otherwise essentially unchanged, as discussed above. Electronically Signed   By: Vinnie Langton M.D.   On: 02/06/2015 18:48   Dg Chest 2 View  02/05/2015  CLINICAL DATA:  Pneumothorax followup EXAM: CHEST  2 VIEW COMPARISON:  02/04/2015 FINDINGS: Slight increase in left apical pneumothorax. No change in lingular mass and left effusion. Mild right lower lobe atelectasis also unchanged. Port-A-Cath tip in the SVC IMPRESSION: Slight increase in left apical pneumothorax No change lingular mass and left effusion. Mild right lower lobe  atelectasis also unchanged. Electronically Signed   By: Franchot Gallo M.D.   On: 02/05/2015 07:28   Dg Chest 2 View  02/04/2015  CLINICAL DATA:  Pre bronchoscopy. EXAM: CHEST  2 VIEW COMPARISON:  Chest CT 01/24/2015 FINDINGS: Large left-sided mass contiguous with the left heart border correlating with previous CT and PET findings. There is a small left pleural effusion which is stable. No superimposed pneumonia or edema. No air leak. Status post left mastectomy and axillary dissection. Right-sided porta catheter with tip at the SVC level. IMPRESSION: Known left-sided mass and small pleural effusion. No change since  01/24/2015 CT. Electronically Signed   By: Monte Fantasia M.D.   On: 02/04/2015 06:33   Ct Angio Chest Pe W/cm &/or Wo Cm  02/27/2015  CLINICAL DATA:  Acute onset of black diarrhea, fever, nausea and worsening shortness of breath. Decreased appetite and generalized weakness. Current history of stage IV endometrial cancer, on chemotherapy. Initial encounter. EXAM: CT ANGIOGRAPHY CHEST CT ABDOMEN AND PELVIS WITH CONTRAST TECHNIQUE: Multidetector CT imaging of the chest was performed using the standard protocol during bolus administration of intravenous contrast. Multiplanar CT image reconstructions and MIPs were obtained to evaluate the vascular anatomy. Multidetector CT imaging of the abdomen and pelvis was performed using the standard protocol during bolus administration of intravenous contrast. CONTRAST:  160m OMNIPAQUE IOHEXOL 350 MG/ML SOLN COMPARISON:  Chest radiograph performed 03/02/2015, and CTA of the chest performed 02/06/2015 FINDINGS: CTA CHEST FINDINGS There is no evidence of pulmonary embolus. There is compression of the left main pulmonary artery by the patient's enlarging left-sided lung mass, though it remains patent. A rapidly enlarging left-sided lung mass is noted, measuring approximately 17.0 x 15.1 x 9.5 cm. This has increased in size from 8.3 x 3.9 cm in January. Increasing underlying loculated left-sided pleural fluid is noted, reflecting a malignant pleural effusion. Underlying compressive atelectasis is noted. Right-sided lung nodules measure up to 2.1 cm in size, also increased in size. Trace right-sided pleural fluid is noted, with associated atelectasis. There is no evidence of pneumothorax. A small amount of pericardial fluid is noted, reflecting infiltration of the mass into the left side of the mediastinum. Contrast and air are noted within the esophagus, likely reflecting some degree of esophageal dysmotility. No definite mediastinal lymphadenopathy is seen; mediastinal nodes  were previously noted to be hypermetabolic, though still normal in size. The great vessels are grossly unremarkable in appearance. A right-sided chest port is noted. Clips are noted at the left axilla. No axillary lymphadenopathy is seen. The visualized portions of the thyroid gland are unremarkable in appearance. No acute osseous abnormalities are seen. CT ABDOMEN and PELVIS FINDINGS A hepatic cyst is again noted, difficult to fully assess due to motion artifact. The liver and spleen are otherwise unremarkable. The gallbladder is within normal limits. The pancreas and adrenal glands are unremarkable. A few tiny left renal cysts are noted. The kidneys are otherwise unremarkable. There is no evidence of hydronephrosis. No renal or ureteral stones are seen. No perinephric stranding is appreciated. No free fluid is identified. The small bowel is unremarkable in appearance. The stomach is within normal limits. No acute vascular abnormalities are seen. Scattered calcification is noted along the abdominal aorta and its branches. The appendix is normal in caliber, without evidence of appendicitis. Note is made of mild wall thickening along the descending and sigmoid colon, concerning for very mild colitis, either infectious or inflammatory in nature. The bladder is decompressed and not well assessed. The patient  is status post hysterectomy. No suspicious adnexal masses are seen. No inguinal lymphadenopathy is seen. No acute osseous abnormalities are identified. Vacuum phenomenon is noted at L5-S1. Review of the MIP images confirms the above findings. IMPRESSION: 1. No evidence of pulmonary embolus. 2. Rapidly enlarging left-sided lung mass, measuring 17.0 x 15.1 x 9.5 cm, increased in size from 8.3 x 3.9 cm in January, and from 3.6 x 2.8 cm in December. Increasing underlying loculated left-sided malignant pleural effusion seen, with associated compressive atelectasis. Findings compatible with markedly worsening metastatic  disease. 3. Mild wall thickening along the descending and sigmoid colon, concerning for very mild colitis, either infectious or inflammatory in nature. 4. Underlying right-sided lung nodules also increased in size, now measuring up to 2.1 cm, with trace right-sided pleural fluid and atelectasis. 5. Compression of the left main pulmonary artery by the enlarging left-sided lung mass, though it remains patent. 6. Small amount of pericardial fluid reflects infiltration of the mass into the left side of the mediastinum. 7. Contrast and air within the esophagus, likely reflecting some degree of esophageal dysmotility. 8. Hepatic cyst again noted.  Few tiny left renal cyst seen. 9. Scattered calcification along the abdominal aorta and its branches. These results were called by telephone at the time of interpretation on 02/27/2015 at 2:32 am to Dr. Pryor Curia, who verbally acknowledged these results. Electronically Signed   By: Garald Balding M.D.   On: 02/27/2015 02:38   Ct Angio Chest Pe W/cm &/or Wo Cm  02/06/2015  CLINICAL DATA:  Cough and shortness of breath for 2 days. Symptoms after having bronchoscopy. Patient with history of left breast cancer and endometrial cancer with lung metastasis. EXAM: CT ANGIOGRAPHY CHEST WITH CONTRAST TECHNIQUE: Multidetector CT imaging of the chest was performed using the standard protocol during bolus administration of intravenous contrast. Multiplanar CT image reconstructions and MIPs were obtained to evaluate the vascular anatomy. CONTRAST:  157m OMNIPAQUE IOHEXOL 350 MG/ML SOLN COMPARISON:  Most recent chest CT 01/24/2015. Most recent chest radiograph earlier this day at 1824 hour FINDINGS: There are no filling defects within the pulmonary arteries to suggest pulmonary embolus, with limitations secondary to breathing motion artifact (particularly in the right lung base) and left lung atelectasis. A questionable filling defect seen only on a single image series 10 image 163 is  felt to be artifactual. Small left anterior apical pneumothorax. Left pleural effusion with increase from CT 13 days prior, now moderate in size. There is no definite pleural nodularity. Pleural effusion causes marked atelectasis in the left lower and to a lesser extent dependent left upper lobe. No right pleural effusion. The left paramediastinal masses have increased in size in the short interim, current measurement 6.0 x 9.0 cm (previously 7.0 x 4.7 cm), of the superior lesion; more inferior lesion also increased in size 6.4 x 5.0 cm (previously 5.2 x 3.2 cm). Masslike density between these 2 lesions has also increased. Right lower lobe pulmonary nodule measures 12 x 11 mm, previously 7 x 7 mm, with adjacent cystic space unchanged. There is mild rightward mediastinal shift, new from prior CT. No definite mediastinal adenopathy. No pericardial effusion. Esophagus is patulous and fluid-filled. Unchanged appearance of the upper abdomen. 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 No focal osseous lesions are seen. Review of the MIP images confirms the above findings. IMPRESSION: 1. No pulmonary embolus allowing for limitations secondary to breathing artifact and left pleural effusion. 2. Left hydro pneumothorax, with small pneumothorax component anterior apical and moderate hydrothorax.  Pleural fluid measures simple fluid density. 3. Increased size of the left paramediastinal masses in the short interim (13 days). Right lower lobe 11 mm pulmonary nodule has also increased in the interim. 4. There is mild rightward mediastinal shift from prior CT, unclear whether this is secondary to pleural effusion, left-sided pulmonary masses, or pneumothorax, though the relative size of pneumothorax component compared to pleural effusion argues against this as etiology. These results were called by telephone at the time of interpretation on 02/06/2015 at 9:24 pm to Dr. Francine Graven , who verbally acknowledged these results.  Electronically Signed   By: Jeb Levering M.D.   On: 02/06/2015 21:26   Ct Abdomen Pelvis W Contrast  02/27/2015  CLINICAL DATA:  Acute onset of black diarrhea, fever, nausea and worsening shortness of breath. Decreased appetite and generalized weakness. Current history of stage IV endometrial cancer, on chemotherapy. Initial encounter. EXAM: CT ANGIOGRAPHY CHEST CT ABDOMEN AND PELVIS WITH CONTRAST TECHNIQUE: Multidetector CT imaging of the chest was performed using the standard protocol during bolus administration of intravenous contrast. Multiplanar CT image reconstructions and MIPs were obtained to evaluate the vascular anatomy. Multidetector CT imaging of the abdomen and pelvis was performed using the standard protocol during bolus administration of intravenous contrast. CONTRAST:  116m OMNIPAQUE IOHEXOL 350 MG/ML SOLN COMPARISON:  Chest radiograph performed 02/08/2015, and CTA of the chest performed 02/06/2015 FINDINGS: CTA CHEST FINDINGS There is no evidence of pulmonary embolus. There is compression of the left main pulmonary artery by the patient's enlarging left-sided lung mass, though it remains patent. A rapidly enlarging left-sided lung mass is noted, measuring approximately 17.0 x 15.1 x 9.5 cm. This has increased in size from 8.3 x 3.9 cm in January. Increasing underlying loculated left-sided pleural fluid is noted, reflecting a malignant pleural effusion. Underlying compressive atelectasis is noted. Right-sided lung nodules measure up to 2.1 cm in size, also increased in size. Trace right-sided pleural fluid is noted, with associated atelectasis. There is no evidence of pneumothorax. A small amount of pericardial fluid is noted, reflecting infiltration of the mass into the left side of the mediastinum. Contrast and air are noted within the esophagus, likely reflecting some degree of esophageal dysmotility. No definite mediastinal lymphadenopathy is seen; mediastinal nodes were previously noted  to be hypermetabolic, though still normal in size. The great vessels are grossly unremarkable in appearance. A right-sided chest port is noted. Clips are noted at the left axilla. No axillary lymphadenopathy is seen. The visualized portions of the thyroid gland are unremarkable in appearance. No acute osseous abnormalities are seen. CT ABDOMEN and PELVIS FINDINGS A hepatic cyst is again noted, difficult to fully assess due to motion artifact. The liver and spleen are otherwise unremarkable. The gallbladder is within normal limits. The pancreas and adrenal glands are unremarkable. A few tiny left renal cysts are noted. The kidneys are otherwise unremarkable. There is no evidence of hydronephrosis. No renal or ureteral stones are seen. No perinephric stranding is appreciated. No free fluid is identified. The small bowel is unremarkable in appearance. The stomach is within normal limits. No acute vascular abnormalities are seen. Scattered calcification is noted along the abdominal aorta and its branches. The appendix is normal in caliber, without evidence of appendicitis. Note is made of mild wall thickening along the descending and sigmoid colon, concerning for very mild colitis, either infectious or inflammatory in nature. The bladder is decompressed and not well assessed. The patient is status post hysterectomy. No suspicious adnexal masses are seen.  No inguinal lymphadenopathy is seen. No acute osseous abnormalities are identified. Vacuum phenomenon is noted at L5-S1. Review of the MIP images confirms the above findings. IMPRESSION: 1. No evidence of pulmonary embolus. 2. Rapidly enlarging left-sided lung mass, measuring 17.0 x 15.1 x 9.5 cm, increased in size from 8.3 x 3.9 cm in January, and from 3.6 x 2.8 cm in December. Increasing underlying loculated left-sided malignant pleural effusion seen, with associated compressive atelectasis. Findings compatible with markedly worsening metastatic disease. 3. Mild wall  thickening along the descending and sigmoid colon, concerning for very mild colitis, either infectious or inflammatory in nature. 4. Underlying right-sided lung nodules also increased in size, now measuring up to 2.1 cm, with trace right-sided pleural fluid and atelectasis. 5. Compression of the left main pulmonary artery by the enlarging left-sided lung mass, though it remains patent. 6. Small amount of pericardial fluid reflects infiltration of the mass into the left side of the mediastinum. 7. Contrast and air within the esophagus, likely reflecting some degree of esophageal dysmotility. 8. Hepatic cyst again noted.  Few tiny left renal cyst seen. 9. Scattered calcification along the abdominal aorta and its branches. These results were called by telephone at the time of interpretation on 02/27/2015 at 2:32 am to Dr. Pryor Curia, who verbally acknowledged these results. Electronically Signed   By: Garald Balding M.D.   On: 02/27/2015 02:38   Dg Chest 1v Repeat Same Day  02/27/2015  CLINICAL DATA:  Some worsening in shortness of breath and night. Known lung mass. History of breast cancer. Initial encounter. EXAM: CHEST - 1 VIEW SAME DAY COMPARISON:  Single view of the chest earlier today and 02/17/2015. CT chest 02/27/2015. FINDINGS: There is increased airspace opacity in left upper lung zone since the study earlier today likely due to increased pleural fluid and airspace disease in this patient with a large left chest mass. There is patchy airspace disease in the right lung which is unchanged in appearance. The heart is displaced to the right. Port-A-Cath is again noted. The patient is status post left mastectomy and axillary dissection. IMPRESSION: Some increase in opacity in the left upper lung zone since the examination earlier today compatible with increased pleural effusion and airspace disease in this patient with a large left chest mass. Electronically Signed   By: Inge Rise M.D.   On: 02/27/2015  19:04   Dg Chest 1v Repeat Same Day  02/04/2015  CLINICAL DATA:  Follow-up pneumothorax. EXAM: CHEST - 1 VIEW SAME DAY COMPARISON:  Chest x-ray from earlier same day. FINDINGS: An interval slight decrease in size of the left apical pneumothorax, now measuring 1 cm or less of pleural displacement (previously up to 2 cm). Cardiomediastinal silhouette is stable in size and configuration. Stable appearance of the left lower lobe mass. Probable slight increase in the bilateral perihilar edema. Right chest wall Port-A-Cath is stable in position with tip projected over the lower SVC. IMPRESSION: 1. Slight interval DECREASE in the size of the left apical pneumothorax, with measurements given above. 2. Mild perihilar edema, increased in the short-term interval, suggesting mild volume overload/CHF. 3. Left lower lobe mass, stable in the short-term interval, better seen on recent chest CT. 4. Stable cardiomegaly. Electronically Signed   By: Franki Cabot M.D.   On: 02/04/2015 13:16   Dg Chest Port 1 View  02/27/2015  CLINICAL DATA:  Fever and shortness of breath. EXAM: PORTABLE CHEST 1 VIEW COMPARISON:  02/28/2015, earlier the same day. FINDINGS: 2341 hours. Large  left lung mass again noted with associated large pleural effusion. Surgical clips are noted in the region of the left axilla. These changes generate mass-effect on the mediastinum, displacing cardiomediastinal anatomy to the right, as before. Right-sided Port-A-Cath remains in place. Nodular density at the right lung base is compatible with the patient's known right-sided lung lesion. There is probably some atelectasis in the right midline today. Bone windows reveal no worrisome lytic or sclerotic osseous lesions. IMPRESSION: Near complete opacification of the left hemi thorax in this patient with known large left lung mass and large left pleural effusion. No substantial interval change when taking into account slight rotation and differential positioning on  the current study. Electronically Signed   By: Misty Stanley M.D.   On: 02/27/2015 00:09   Dg Chest Port 1 View  02/11/2015  CLINICAL DATA:  Status post left chest tube removal. Short of breath and cough. EXAM: PORTABLE CHEST 1 VIEW COMPARISON:  02/11/2015 at 6:12 a.m., and older exams. FINDINGS: Mass superimposed on the cardiac silhouette is stable. Left chest tube has been removed. Small left apical pneumothorax is decreased in size when compared the prior study. No new lung opacities. Right anterior chest wall Port-A-Cath is stable. IMPRESSION: 1. Small left pneumothorax, decreased in size from the earlier study despite removal of the left chest tube. 2. No new abnormalities. Electronically Signed   By: Lajean Manes M.D.   On: 02/11/2015 12:05   Dg Chest Port 1 View  02/11/2015  CLINICAL DATA:  Chest tube placement for pneumothorax EXAM: PORTABLE CHEST 1 VIEW COMPARISON:  February 10, 2015 chest radiograph and chest CT February 06, 2015 FINDINGS: Chest tube remains on the left. There is a persistent small pneumothorax on the left with apical and basilar components, not changed. No tension component. There remains a sizable left lower lobe mass, somewhat ill-defined by radiography but grossly stable. There is a superimposed left pleural effusion. On the right, there is slightly less atelectasis in the right base. No new opacity on the right. Port-A-Cath tip is at the cavoatrial junction. No pneumothorax is apparent on the right. The heart appears enlarged but stable. The pulmonary vascularity is within normal limits. There are surgical clips in the left axillary region. IMPRESSION: Persistent small pneumothorax on the left without tension component. No change in tube and catheter positions. Left lower lobe mass with effusion remains. There may well be associated pneumonitis in this area on the left. On the right, there is less right base atelectasis. No new opacity on the right. No change in cardiac silhouette.  Electronically Signed   By: Lowella Grip III M.D.   On: 02/11/2015 07:18   Dg Chest Port 1 View  02/10/2015  CLINICAL DATA:  Patient with left-sided chest tube. History of pneumonia. Lung mass. EXAM: PORTABLE CHEST 1 VIEW COMPARISON:  Chest radiograph 02/09/2015 FINDINGS: Central venous catheter tip projects over the superior vena cava, unchanged. Interval removal of 1 of the 2 left chest tubes. New small left pneumothorax. Re- demonstrated large left lung mass. Persistent small left pleural effusion. Unchanged heterogeneous opacities right lung base. IMPRESSION: Interval removal of 1 of the left-sided chest tubes. Single chest tube remains in place. There is a new small left pneumothorax. Unchanged large left lung mass and pleural effusion. Critical Value/emergent results were called by telephone at the time of interpretation on 02/10/2015 at 10:38 am to Dr. Ellwood Handler , who verbally acknowledged these results. Electronically Signed   By: Polly Cobia.D.  On: 02/10/2015 10:39   Dg Chest Port 1 View  02/09/2015  CLINICAL DATA:  Pleural effusions EXAM: PORTABLE CHEST 1 VIEW COMPARISON:  02/08/2015 FINDINGS: RIGHT power port. 2 LEFT chest tubes at the LEFT apex. No appreciable pneumothorax. Large LEFT lung mass again demonstrated. LEFT effusion unchanged. A patchy nodularity in the RIGHT lung. IMPRESSION: 1. Stable support apparatus. 2. No interval change. 3. Large LEFT lung mass. 4. Patchy nodularity in the RIGHT lung. Electronically Signed   By: Suzy Bouchard M.D.   On: 02/09/2015 09:44   Dg Chest Port 1 View  02/08/2015  CLINICAL DATA:  Postop EXAM: PORTABLE CHEST 1 VIEW COMPARISON:  02/06/2015 FINDINGS: There are now 2 left chest tubes. No pneumothorax. Large mass in the left mid and lower lung zones is stable. There is less density over the left lower lung zone likely related to resolution of the left pleural effusion. Right jugular Port-A-Cath is stable. Linear atelectasis in the right mid and  lower lung zones. Cardiomegaly. IMPRESSION: Left chest tubes are now in place and there is no left pneumothorax. Subsegmental atelectasis in the right mid and lower lung zones. Resolved left pleural effusion. Electronically Signed   By: Marybelle Killings M.D.   On: 02/08/2015 12:32   Dg Chest Port 1 View  02/04/2015  CLINICAL DATA:  Pneumothorax, post bronchoscopy and biopsy EXAM: PORTABLE CHEST 1 VIEW COMPARISON:  02/04/2015 FINDINGS: Cardiomegaly again noted. Again noted left lower hemithorax mass silhouetting the left heart border. There is about 20% left upper and left lateral pneumothorax measures 2.2 cm maximum thickness superiorly. Surgical clips are noted in left axilla. Right IJ Port-A-Cath with tip in SVC right atrium junction. IMPRESSION: Left side mass again noted silhouetting the left heart border. There is about 20% left upper and left lateral pneumothorax. Surgical clips are noted in left axilla. These results were called by telephone at the time of interpretation on 02/04/2015 at 10:49 am to Dr. Modesto Charon , who verbally acknowledged these results. Electronically Signed   By: Lahoma Crocker M.D.   On: 02/04/2015 10:49   Dg Swallowing Func-speech Pathology  02/07/2015  Objective Swallowing Evaluation: Type of Study: MBS-Modified Barium Swallow Study Patient Details Name: Alejandra Callahan MRN: [id number] Date of Birth: April 14, 1945 Today's Date: 02/07/2015 Time: SLP Start Time (ACUTE ONLY): 0948-SLP Stop Time (ACUTE ONLY): 1000 SLP Time Calculation (min) (ACUTE ONLY): 12 min Past Medical History: Past Medical History Diagnosis Date . Cancer (HCC)    breast . Arthritis    Rheumatoid arthritis . PONV (postoperative nausea and vomiting) 1982 . Breast cancer ([place]) 09/1980   left unilateral mastectomy, no chemo or radiatioin . Endometrial cancer ([place]) 2014 . Lung mass  . Pneumonia    hx Past Surgical History: Past Surgical History Procedure Laterality Date . Mastectomy Left 09/1980 . Tubal ligation  April 1982 .  Dilatation & currettage/hysteroscopy with resectocope N/A 11/18/2012   Procedure: DILATATION & CURETTAGE/HYSTEROSCOPY WITH RESECTOCOPE;  Surgeon: Marvene Staff, MD;  Location: [facility];  Service: Gynecology;  Laterality: N/A; . Breast biopsy Left 09/11/80 . Vaginal hysterectomy  12/09/2012   Complete hysterectomy - [place] placement Right 8/16 . Abdominal hysterectomy   . Lung biopsy  01/18/15 . Video bronchoscopy with endobronchial navigation N/A 02/04/2015   Procedure: VIDEO BRONCHOSCOPY WITH ENDOBRONCHIAL NAVIGATION;  Surgeon: [name], MD;  Location: [facility];  Service: Thoracic;  Laterality: N/A; HPI: Alejandra Callahan is an 70 y.o. 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 Patient was discharged from hospital 1/31 s/p lung biopsy where she was noted to have a pneumothorax. Pneumothorax noted to be stable upon discharge. Per patient, coughing worsens when ambulating and trying to eat. MD questioning aspiration and bronchospasm as cause of acute decompensation.  No Data Recorded Assessment / Plan / Recommendation CHL IP CLINICAL IMPRESSIONS 02/07/2015 Therapy Diagnosis WFL Clinical Impression Patient presents with largely normal oropharyngeal swallowing function with intermittent delay in swallow initiation but with good airway protection . No frank penetration or aspiration observed. Esophageal sweep revealed no significant abnormalities, possible mild stasis in lower esophagus however MD not present to confirm.  Impact on safety and function Mild aspiration risk   CHL IP TREATMENT RECOMMENDATION 02/07/2015 Treatment Recommendations No treatment recommended at this time   No flowsheet data found. CHL IP DIET RECOMMENDATION 02/07/2015 SLP Diet Recommendations Regular solids;Thin liquid Liquid Administration via Cup;Straw Medication Administration Whole meds with liquid Compensations Slow rate;Small sips/bites Postural  Changes Remain semi-upright after after feeds/meals (Comment);Seated upright at 90 degrees   CHL IP OTHER RECOMMENDATIONS 02/07/2015 Recommended Consults -- Oral Care Recommendations Oral care BID Other Recommendations --   CHL IP FOLLOW UP RECOMMENDATIONS 02/07/2015 Follow up Recommendations None   No flowsheet data found.     CHL IP ORAL PHASE 02/07/2015 Oral Phase WFL Oral - Pudding Teaspoon -- Oral - Pudding Cup -- Oral - Honey Teaspoon -- Oral - Honey Cup -- Oral - Nectar Teaspoon -- Oral - Nectar Cup -- Oral - Nectar Straw -- Oral - Thin Teaspoon -- Oral - Thin Cup -- Oral - Thin Straw -- Oral - Puree -- Oral - Mech Soft -- Oral - Regular -- Oral - Multi-Consistency -- Oral - Pill -- Oral Phase - Comment --  CHL IP PHARYNGEAL PHASE 02/07/2015 Pharyngeal Phase WFL Pharyngeal- Pudding Teaspoon -- Pharyngeal -- Pharyngeal- Pudding Cup -- Pharyngeal -- Pharyngeal- Honey Teaspoon -- Pharyngeal -- Pharyngeal- Honey Cup -- Pharyngeal -- Pharyngeal- Nectar Teaspoon -- Pharyngeal -- Pharyngeal- Nectar Cup -- Pharyngeal -- Pharyngeal- Nectar Straw -- Pharyngeal -- Pharyngeal- Thin Teaspoon -- Pharyngeal -- Pharyngeal- Thin Cup -- Pharyngeal -- Pharyngeal- Thin Straw -- Pharyngeal -- Pharyngeal- Puree -- Pharyngeal -- Pharyngeal- Mechanical Soft -- Pharyngeal -- Pharyngeal- Regular -- Pharyngeal -- Pharyngeal- Multi-consistency -- Pharyngeal -- Pharyngeal- Pill -- Pharyngeal -- Pharyngeal Comment --  CHL IP CERVICAL ESOPHAGEAL PHASE 02/07/2015 Cervical Esophageal Phase WFL Pudding Teaspoon -- Pudding Cup -- Honey Teaspoon -- Honey Cup -- Nectar Teaspoon -- Nectar Cup -- Nectar Straw -- Thin Teaspoon -- Thin Cup -- Thin Straw -- Puree -- Mechanical Soft -- Regular -- Multi-consistency -- Pill -- Cervical Esophageal Comment -- No flowsheet data found. Alejandra Rainwater MA, CCC-SLP [phone] [place] Alejandra Callahan 02/07/2015, 12:28 PM              US Thoracentesis Asp Pleural Space W/img Guide  02/27/2015  INDICATION: LEFT pleural effusion,  large LEFT lung mass, history breast cancer, endometrial cancer EXAM: ULTRASOUND GUIDED LEFT THORACENTESIS PROCEDURE: Procedure, benefits, and risks of procedure were discussed with patient. Written informed consent for procedure was obtained. Time out protocol followed. Pleural effusion localized by ultrasound at the posterior LEFT hemithorax. Fluid contains numerous internal echoes and appears complex. Skin prepped and draped in usual sterile fashion. Skin and soft tissues anesthetized with 8 mL of 1% lidocaine. 8 French thoracentesis catheter placed into the LEFT pleural space. 200 mL of dark old grossly bloody fluid  was aspirated by syringe pump. At this point, flow fluid ceased and could not be restarted likely due to the complicated nature of the fluid and question loculation. Procedure tolerated well by patient without immediate complication. FINDINGS: A total of approximately 200 mL of bloody LEFT pleural fluid was removed. A fluid sample of 180 mL was sent for laboratory analysis. IMPRESSION: Successful ultrasound guided LEFT thoracentesis yielding 200 mL of pleural fluid. Electronically Signed   By: Lavonia Dana M.D.   On: 02/27/2015 14:10   Dg C-arm Bronchoscopy  02/04/2015  CLINICAL DATA:  C-ARM BRONCHOSCOPY Fluoroscopy was utilized by the requesting physician.  No radiographic interpretation.  Minnie.Brome week]   Impression: 70 year old unfortunate female with stage IV endometrial cancer, metastasis to lung with rapidly enlarging left lung mass, malignant pleural effusion. Recent profuse diarrhea, reportedly black although sister states it was dark green (on iron therapy), was heme+. C. difficile quick screen showed positive antigen, negative toxin. In the setting of chemotherapy, acute onset profuse diarrhea agree with treating for C. difficile colitis. She is currently on oral vancomycin 125 mg every 6 hours. Agree with avoiding PPI and utilizing Pepcid. Cannot exclude chemo related diarrhea? Really not  clear that she has had significant GI bleeding. She is heme positive, hemoglobin has been stable this admission although it is noted that she has required blood transfusion recently (in setting of chemotherapy).    Plan: 1. Continue oral vancomycin, avoid PPI in setting of possible Cdiff.  2. Monitor hemoglobin, but avoid endoscopic evaluation unless clear evidence of significant GI bleed.  3. Discussed plan with patient and daughter who are in agreement. 4. Collect stool for GI pathogen panel if ongoing diarrhea.  5. Update CBC.  We would like to thank you for the opportunity to participate in the care of JANITA CAMBEROS.  Laureen Ochs. Bernarda Caffey [facility] [phone] 2/23/201710:04 AM     LOS: 1 day

## 2015-03-01 ENCOUNTER — Inpatient Hospital Stay (HOSPITAL_COMMUNITY): Payer: Medicare Other

## 2015-03-01 DIAGNOSIS — R0682 Tachypnea, not elsewhere classified: Secondary | ICD-10-CM

## 2015-03-01 DIAGNOSIS — J9621 Acute and chronic respiratory failure with hypoxia: Secondary | ICD-10-CM | POA: Insufficient documentation

## 2015-03-01 DIAGNOSIS — R000 Tachycardia, unspecified: Secondary | ICD-10-CM

## 2015-03-01 DIAGNOSIS — R195 Other fecal abnormalities: Secondary | ICD-10-CM | POA: Insufficient documentation

## 2015-03-01 DIAGNOSIS — A419 Sepsis, unspecified organism: Secondary | ICD-10-CM

## 2015-03-01 DIAGNOSIS — R6510 Systemic inflammatory response syndrome (SIRS) of non-infectious origin without acute organ dysfunction: Secondary | ICD-10-CM | POA: Insufficient documentation

## 2015-03-01 DIAGNOSIS — Z66 Do not resuscitate: Secondary | ICD-10-CM

## 2015-03-01 DIAGNOSIS — C541 Malignant neoplasm of endometrium: Secondary | ICD-10-CM

## 2015-03-01 DIAGNOSIS — J948 Other specified pleural conditions: Secondary | ICD-10-CM

## 2015-03-01 DIAGNOSIS — C7802 Secondary malignant neoplasm of left lung: Secondary | ICD-10-CM

## 2015-03-01 DIAGNOSIS — R918 Other nonspecific abnormal finding of lung field: Secondary | ICD-10-CM

## 2015-03-01 LAB — BASIC METABOLIC PANEL
Anion gap: 7 (ref 5–15)
BUN: 11 mg/dL (ref 6–20)
CALCIUM: 8.5 mg/dL — AB (ref 8.9–10.3)
CO2: 32 mmol/L (ref 22–32)
CREATININE: 0.4 mg/dL — AB (ref 0.44–1.00)
Chloride: 93 mmol/L — ABNORMAL LOW (ref 101–111)
GFR calc Af Amer: >60 mL/min (ref >60)
GFR calc non Af Amer: >60 mL/min (ref >60)
GLUCOSE: 105 mg/dL — AB (ref 65–99)
Potassium: 3.2 mmol/L — ABNORMAL LOW (ref 3.5–5.1)
Sodium: 132 mmol/L — ABNORMAL LOW (ref 135–145)

## 2015-03-01 LAB — HEMOGLOBIN AND HEMATOCRIT, BLOOD
HCT: 30.7 % — ABNORMAL LOW (ref 36.0–46.0)
Hemoglobin: 10 g/dL — ABNORMAL LOW (ref 12.0–15.0)

## 2015-03-01 LAB — GLUCOSE, CAPILLARY
Glucose-Capillary: 171 mg/dL — ABNORMAL HIGH (ref 65–99)
Glucose-Capillary: 236 mg/dL — ABNORMAL HIGH (ref 65–99)

## 2015-03-01 MED ORDER — HYDROCORTISONE NA SUCCINATE PF 100 MG IJ SOLR
50.0000 mg | Freq: Three times a day (TID) | INTRAMUSCULAR | Status: DC
  Administered 2015-03-01 – 2015-03-03 (×7): 50 mg via INTRAVENOUS
  Filled 2015-03-01 (×7): qty 2

## 2015-03-01 MED ORDER — METOPROLOL TARTRATE 1 MG/ML IV SOLN: 2.5000 mg | Freq: Three times a day (TID) | INTRAVENOUS | Status: DC | PRN

## 2015-03-01 MED ORDER — POTASSIUM CHLORIDE CRYS ER 20 MEQ PO TBCR
40.0000 meq | EXTENDED_RELEASE_TABLET | ORAL | Status: AC
  Administered 2015-03-01: 40 meq via ORAL
  Filled 2015-03-01: qty 2

## 2015-03-01 MED ORDER — MORPHINE SULFATE 2 MG/ML IJ SOLN
1.0000 mg | INTRAMUSCULAR | Status: DC | PRN
  Administered 2015-03-01 – 2015-03-03 (×17): 1 mg via INTRAVENOUS
  Filled 2015-03-01 (×34): qty 1

## 2015-03-01 MED ORDER — MAGNESIUM SULFATE 2 GM/50ML IV SOLN
2.0000 g | Freq: Once | INTRAVENOUS | Status: AC
  Administered 2015-03-01: 2 g via INTRAVENOUS
  Filled 2015-03-01: qty 50

## 2015-03-01 MED ORDER — POLYETHYLENE GLYCOL 3350 17 G PO PACK: 17.0000 g | PACK | Freq: Every day | ORAL | Status: DC | PRN

## 2015-03-01 MED ORDER — LEVALBUTEROL HCL 0.63 MG/3ML IN NEBU
0.6300 mg | INHALATION_SOLUTION | Freq: Two times a day (BID) | RESPIRATORY_TRACT | Status: DC
  Administered 2015-03-02 – 2015-03-03 (×3): 0.63 mg via RESPIRATORY_TRACT
  Filled 2015-03-01 (×4): qty 3

## 2015-03-01 MED ORDER — MORPHINE SULFATE 2 MG/ML IJ SOLN
1.0000 mg | INTRAMUSCULAR | Status: AC
  Administered 2015-03-01: 1 mg via INTRAVENOUS
  Filled 2015-03-01: qty 1

## 2015-03-01 NOTE — Progress Notes (Signed)
[facility] PROGRESS NOTE  Alejandra Callahan:[id number] DOB: [birth year] DOA: 02/25/2015 PCP: [name], MD  Assessment/Plan: 1-C. difficile colitis: Patient with left lower quadrant pain, nausea, vomiting, diarrhea and CT scan demonstrating colonic inflammation.  -C. Diffcile toxin was negative the patient has a positive C. difficile antigen -given above symptoms at presentation and findings on CT scan, the decision has been made to treat for C. difficile colitis. -Will avoid the use of PPIs while treating for C. diff -GI pathogen panel has been order (still pending),will follow recommendations by GI. -Diarrhea has subsided and there is no signs of overt bleeding  -Will continue supportive care and follow clinical response.  2-acute on chronic respiratory failure with hypoxia: Appears to be secondary to malignant pleural effusion and post obstructive pneumonia -Patient is status post thoracentesis -Fluid cultures are still pending; but laboratory work demonstrated an exudate in nature. -Patient started on Zosyn to cover for post obstructive pneumonia. -Will follow clinical response, continue nebulizer treatments, oxygen supplementation and supportive care. -will repeat CXR in am; most likely will require another thoracentesis and possible pleurex catheter   3-sirs/sepsis: secondary to C. difficile colitis versus post obstructive pneumonia  -Will continue gentle fluid resuscitation; still not eating or drinking much.  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  Code Status: Full code Family Communication: Daughter and son at bedside Disposition Plan: Will remain in the stepdown to guarantee a stability, continue IV Pepcid, continue treatment for C. difficile and also Zosyn for possible obstructive pneumonia. Will advance diet further. Follow rec's from GI and oncology service   Consultants:  GI service  Oncology service  Procedures:  See below for x-ray reports  Thoracentesis  (2/22)  Antibiotics:  Zosyn 2/23  Oral vancomycin 2/22  HPI/Subjective: Still with somewhat labor breathing, tachypnea and tachycardia. Appetite poor. No further nausea, vomiting, diarrhea or black stools. Good O2 sat on 4L  Objective: Filed Vitals:   03/01/15 0600 03/01/15 0800  BP: 109/63   Pulse: 124   Temp:  98.7 F (37.1 C)  Resp: 16     Intake/Output Summary (Last 24 hours) at 03/01/15 1046 Last data filed at 03/01/15 0600  Gross per 24 hour  Intake 806.25 ml  Output   1100 ml  Net -293.75 ml   Filed Weights   02/27/15 2000 02/28/15 0500 03/01/15 0500  Weight: 55.3 kg (121 lb 14.6 oz) 52.5 kg (115 lb 11.9 oz) 53.7 kg (118 lb 6.2 oz)    Exam:   General:  Afebrile, breathing is still somewhat labor; with tachypnea and mild use of accessory muscles. Patient has also remained tachycardic. denies CP, abd pain, nausea, vomiting and BM at all for 48 hours now. Appetite continue to be poor. No signs of overt bleeding.   Cardiovascular: Sinus tachycardia, no rubs, no gallops, no JVD  Respiratory: Mild wheezing, scattered rhonchi, decreased breath sounds on her left lung field  Abdomen: Soft, mild tenderness left lower quadrant; positive bowel sounds;  no guarding and no distention.  Musculoskeletal: No edema, no cyanosis  Data Reviewed: Basic  Metabolic Panel:  Recent Labs Lab 02/06/2015 2239 03/01/15 0510  NA 129* 132*  K 3.5 3.2*  CL 94* 93*  CO2 25 32  GLUCOSE 132* 105*  BUN 15 11  CREATININE 0.60 0.40*  CALCIUM 9.9 8.5*   Liver Function Tests:  Recent Labs Lab 02/14/2015 2239  AST 29  ALT 17  ALKPHOS 105  BILITOT 0.6  PROT 5.6*  ALBUMIN 2.4*    Recent Labs Lab 02/09/2015 2239  LIPASE 16   CBC:  Recent Labs Lab 03/02/2015 1500 02/14/2015 2239 02/28/15 1049  WBC 15.0* 12.9* 8.8  NEUTROABS 13.7* 10.7* 6.9  HGB 12.8 11.6* 9.0*  HCT 38.5 34.5* 26.8*  MCV 100.5* 99.7 99.6  PLT 284 230 185   Cardiac Enzymes:  Recent Labs Lab 02/07/2015 2341  TROPONINI <0.03   BNP (last 3 results)  Recent Labs  02/06/15 1918  BNP 61.0   CBG:  Recent Labs Lab 02/27/15 0829 02/28/15 0803 03/01/15 0854  GLUCAP 111* 95 171*    Recent Results (from the past 240 hour(s))  Blood Culture (routine x 2)     Status: None (Preliminary result)   Collection Time: 02/25/2015 11:41 PM  Result Value Ref Range Status   Specimen Description BLOOD RIGHT ANTECUBITAL  Final   Special Requests   Final    BOTTLES DRAWN AEROBIC AND ANAEROBIC AEB=6CC ANA=5CC   Culture NO GROWTH 2 DAYS  Final   Report Status PENDING  Incomplete  Blood Culture (routine x 2)     Status: None (Preliminary result)   Collection Time: 02/06/2015 11:59 PM  Result Value Ref Range Status   Specimen Description BLOOD PORTA CATH DRAWN BY RN RH  Final   Special Requests BOTTLES DRAWN AEROBIC AND ANAEROBIC 10CC EACH  Final   Culture NO GROWTH 2 DAYS  Final   Report Status PENDING  Incomplete  Urine culture     Status: None   Collection Time: 02/27/15  1:25 AM  Result Value Ref Range Status   Specimen Description URINE, CLEAN CATCH  Final   Special Requests NONE  Final   Culture   Final    MULTIPLE SPECIES PRESENT, SUGGEST RECOLLECTION Performed at [facility]    Report Status 02/28/2015 FINAL  Final  C difficile quick scan w PCR reflex      Status: Abnormal   Collection Time: 02/27/15 11:01 AM  Result Value Ref Range Status   C Diff antigen POSITIVE (A) NEGATIVE Final   C Diff toxin NEGATIVE NEGATIVE Final   C Diff interpretation   Final    C. difficile present, but toxin not detected. This indicates colonization. In most cases, this does not require treatment. If patient has signs and symptoms consistent with colitis, consider treatment. Requires ENTERIC precautions.  Culture, body fluid-bottle     Status: None (Preliminary result)   Collection Time: 02/27/15  1:32 PM  Result Value Ref Range Status   Specimen Description FLUID LEFT PLEURAL  Final   Special Requests BOTTLES DRAWN AEROBIC AND ANAEROBIC 10CC EACH  Final   Gram Stain   Final    FEW WBC PRESENT,BOTH PMN AND MONONUCLEAR NO ORGANISMS SEEN    Culture NO GROWTH 2 DAYS  Final   Report Status PENDING  Incomplete  Gram stain     Status: None (Preliminary result)   Collection Time: 02/27/15  1:32 PM  Result Value Ref Range Status  Specimen Description FLUID LEFT PLEURAL  Final   Special Requests NONE  Final   Gram Stain PENDING  Incomplete   Report Status PENDING  Incomplete     Studies: Dg Chest 1 View  02/27/2015  CLINICAL DATA:  Endometrial cancer, LEFT pleural effusion post thoracentesis EXAM: CHEST 1 VIEW COMPARISON:  CT chest 02/27/2015 FINDINGS: Subtotal opacification of LEFT hemithorax by combination of loculated pleural effusion and large tumor mass as seen on CT. No pneumothorax following LEFT thoracentesis. Mediastinal shift to the RIGHT. Atelectasis RIGHT lung. Post LEFT mastectomy and axillary node dissection. Bones demineralized. IMPRESSION: No pneumothorax following LEFT thoracentesis. Persistent significant opacification of LEFT hemithorax by combination of known large mass and large pleural effusion. Patient asymptomatic post thoracentesis. Electronically Signed   By: Lavonia Dana M.D.   On: 02/27/2015 14:13   Dg Chest 1v Repeat Same Day  02/27/2015   CLINICAL DATA:  Some worsening in shortness of breath and night. Known lung mass. History of breast cancer. Initial encounter. EXAM: CHEST - 1 VIEW SAME DAY COMPARISON:  Single view of the chest earlier today and 03/05/2015. CT chest 02/27/2015. FINDINGS: There is increased airspace opacity in left upper lung zone since the study earlier today likely due to increased pleural fluid and airspace disease in this patient with a large left chest mass. There is patchy airspace disease in the right lung which is unchanged in appearance. The heart is displaced to the right. Port-A-Cath is again noted. The patient is status post left mastectomy and axillary dissection. IMPRESSION: Some increase in opacity in the left upper lung zone since the examination earlier today compatible with increased pleural effusion and airspace disease in this patient with a large left chest mass. Electronically Signed   By: Inge Rise M.D.   On: 02/27/2015 19:04   US Thoracentesis Asp Pleural Space W/img Guide  02/27/2015  INDICATION: LEFT pleural effusion, large LEFT lung mass, history breast cancer, endometrial cancer EXAM: ULTRASOUND GUIDED LEFT THORACENTESIS PROCEDURE: Procedure, benefits, and risks of procedure were discussed with patient. Written informed consent for procedure was obtained. Time out protocol followed. Pleural effusion localized by ultrasound at the posterior LEFT hemithorax. Fluid contains numerous internal echoes and appears complex. Skin prepped and draped in usual sterile fashion. Skin and soft tissues anesthetized with 8 mL of 1% lidocaine. 8 French thoracentesis catheter placed into the LEFT pleural space. 200 mL of dark old grossly bloody fluid was aspirated by syringe pump. At this point, flow fluid ceased and could not be restarted likely due to the complicated nature of the fluid and question loculation. Procedure tolerated well by patient without immediate complication. FINDINGS: A total of  approximately 200 mL of bloody LEFT pleural fluid was removed. A fluid sample of 180 mL was sent for laboratory analysis. IMPRESSION: Successful ultrasound guided LEFT thoracentesis yielding 200 mL of pleural fluid. Electronically Signed   By: Lavonia Dana M.D.   On: 02/27/2015 14:10    Scheduled Meds: . anastrozole  1 mg Oral Daily  . budesonide (PULMICORT) nebulizer solution  0.25 mg Nebulization BID  . cholecalciferol  1,000 Units Oral QHS  . famotidine (PEPCID) IV  20 mg Intravenous Q12H  . feeding supplement (ENSURE ENLIVE)  237 mL Oral BID BM  . ferrous sulfate  325 mg Oral BID WC  . folic acid  1 mg Oral Daily  . hydrocortisone sod succinate (SOLU-CORTEF) inj  50 mg Intravenous Q8H  . levalbuterol  0.63 mg Nebulization 4 times per day  .  multivitamin with minerals  1 tablet Oral Daily  . piperacillin-tazobactam (ZOSYN)  IV  3.375 g Intravenous 3 times per day  . potassium chloride  40 mEq Oral Q4H  . raloxifene  60 mg Oral Daily  . sodium chloride flush  3 mL Intravenous Q12H  . vancomycin  125 mg Oral 4 times per day   Continuous Infusions: . 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 If 7PM-7AM, please contact night-coverage at [email or web address], password [facility] 03/01/2015, 10:46 AM  LOS: 2 days

## 2015-03-01 NOTE — Consult Note (Signed)
Inpatient Hematology/Oncology Consultation   Name: Alejandra Callahan      MRN: [id number]    Location: [facility]10/[facility]10-01  Date: 03/01/2015 Time:1:25 PM   REFERRING PHYSICIAN:  Dr. Dyann Kief  REASON FOR CONSULT:   Stage IV endometrial Cancer End of life issues Tachycardia Tachypnea Progression of Disease   DIAGNOSIS:  Above  HISTORY OF PRESENT ILLNESS:   Patient is very well known to me.  Lovely 70 year old female with stage IV endometrial ca. All disease responded to front line Carboplatin/Taxol except small LL lesion which began to grow at end of therapy. Initially treated with ABX, repeat short interval PET showed further progression. ? Second primary. Biopsy unfortunately showed endometrial cancer, resistant to first line therapy. Hospital course after biopsy was complicated but patient discharged with plans to go on hospice.  She came to my office after discharge with family including her 2 sisters. They all greatly desired for her to try 2nd line therapy. We had a frank discussion about her PS, that it was unlikely to work given progression through 1st line therapy and ultimately we decided to try therapy with clear understanding that if she declined further we would transition to hospice.  Patient now admitted and doing quite poorly. Repeat imaging on 2/22 shows more than doubling of mass size since January. Family is present in room currently, patient is awake, I am consulted to address transition to end of life care.  PAST MEDICAL HISTORY:   Past Medical History  Diagnosis Date  . Cancer (HCC)     breast  . Arthritis     Rheumatoid arthritis  . PONV (postoperative nausea and vomiting) 1982  . Breast cancer ([place]) 09/1980    left unilateral mastectomy, no chemo or radiatioin  . Endometrial cancer ([place]) 2014    mets to the lung  . Lung mass   . Pneumonia     hx    ALLERGIES: Allergies  Allergen Reactions  . Codeine Other (See Comments)    Blurred vision and dizziness blurred  vision, dizzy, loss of hearing  . Acyclovir And Related Swelling, Dermatitis and Rash    fatigue  . Other     TB test- arm swells/fever to extremity  Tegaderm badage- pulls skin off, rash  . Tuberculin Ppd Other (See Comments)    Swelling and redness in arm  . Tylox [Oxycodone-Acetaminophen] Other (See Comments)    Blurred vision and dizziness Blurred vision, dizziness      MEDICATIONS: I have reviewed the patient's current medications.     PAST SURGICAL HISTORY Past Surgical History  Procedure Laterality Date  . Mastectomy Left 09/1980  . Tubal ligation  April 1982  . Dilatation & currettage/hysteroscopy with resectocope N/A 11/18/2012    Procedure: DILATATION & CURETTAGE/HYSTEROSCOPY WITH RESECTOCOPE;  Surgeon: Marvene Staff, MD;  Location: [facility];  Service: Gynecology;  Laterality: N/A;  . Breast biopsy Left 09/11/80  . Vaginal hysterectomy  12/09/2012    Complete hysterectomy - [place] placement Right 8/16  . Abdominal hysterectomy    . Lung biopsy  01/18/15  . Video bronchoscopy with endobronchial navigation N/A 02/04/2015    Procedure: VIDEO BRONCHOSCOPY WITH ENDOBRONCHIAL NAVIGATION;  Surgeon: [name], MD;  Location: [place];  Service: Thoracic;  Laterality: N/A;  . Video assisted thoracoscopy Left 02/08/2015    Procedure: VIDEO ASSISTED THORACOSCOPY, lung biopsy;  Surgeon: [name], MD;  Location: [facility] [place];  Service: Thoracic;  Laterality: Left;  .  Pleural effusion drainage Left 02/08/2015    Procedure: DRAINAGE OF PLEURAL EFFUSION;  Surgeon: [name], MD;  Location: [place];  Service: Thoracic;  Laterality: Left;    FAMILY HISTORY: Family History  Problem Relation Age of Onset  . Breast cancer Mother 44  . Heart attack Mother 37  . Colon cancer Father 79  . Heart attack Father 37  . Breast cancer Sister 84  . Heart attack Paternal Grandfather 20  . Breast cancer Other     mother's paternal first cousin dx in late  40s-early 2s    SOCIAL HISTORY:  reports that she has never smoked. She has never used smokeless tobacco. She reports that she does not drink alcohol or use illicit drugs.  PERFORMANCE STATUS: The patient's performance status is 4 - Bedbound  Review of Systems  Constitutional: Positive for weight loss and malaise/fatigue.  HENT: Negative.   Eyes: Negative.   Respiratory: Positive for cough and shortness of breath.   Cardiovascular: Positive for chest pain. Negative for orthopnea and claudication.  Gastrointestinal: Positive for diarrhea.  Genitourinary: Negative.   Musculoskeletal: Positive for myalgias.  Skin: Negative.   Neurological: Positive for weakness. Negative for dizziness, tingling and seizures.  Psychiatric/Behavioral: Positive for depression.    PHYSICAL EXAMINATION  ECOG PERFORMANCE STATUS: 4 - Bedbound  Filed Vitals:   03/01/15 1100 03/01/15 1200  BP:    Pulse: 134   Temp:  98.6 F (37 C)  Resp: 23     Physical Exam  HENT:  Head: Normocephalic.  Mouth/Throat: No oropharyngeal exudate.  Eyes: Pupils are equal, round, and reactive to light. Right eye exhibits no discharge. Left eye exhibits no discharge. No scleral icterus.  Neck: Normal range of motion. No JVD present. No tracheal deviation present. No thyromegaly present.  Cardiovascular: Normal heart sounds.  Exam reveals no gallop and no friction rub.   No murmur heard. Tachycardic  Pulmonary/Chest: She has no wheezes.  Tachypneic  Abdominal: Soft. She exhibits no distension. There is no tenderness.  Musculoskeletal: She exhibits no tenderness.  Lymphadenopathy:    She has no cervical adenopathy.  Neurological: No cranial nerve deficit.  Somnolent  Skin: Skin is warm and dry. She is not diaphoretic.  Psychiatric: Memory and judgment normal.    LABORATORY DATA:  Results for orders placed or performed during the hospital encounter of 03/05/2015 (from the past 48 hour(s))  Body fluid cell count  with differential     Status: Abnormal   Collection Time: 02/27/15  1:32 PM  Result Value Ref Range   Fluid Type-FCT FLUID     Comment: LEFT PLEURAL COLLECTED BY DOCTOR CORRECTED ON 02/22 AT 1403: PREVIOUSLY REPORTED AS Body Fluid    Color, Fluid RED (A) YELLOW   Appearance, Fluid CLOUDY (A) CLEAR   WBC, Fluid 744 0 - 1000 cu mm   Neutrophil Count, Fluid 31 (H) 0 - 25 %   Lymphs, Fluid 65 %   Monocyte-Macrophage-Serous Fluid 3 (L) 50 - 90 %   Eos, Fluid 1 %   Other Cells, Fluid 0 %    Comment: Performed at [facility]  Glucose, pleural or peritoneal fluid     Status: None   Collection Time: 02/27/15  1:32 PM  Result Value Ref Range   Glucose, Fluid 100 mg/dL    Comment: (NOTE) No normal range established for this test Results should be evaluated in conjunction with serum values Performed at [facility]    Fluid Type-FGLU FLUID  Comment: LEFT PLEURAL COLLECTED BY DOCTOR CORRECTED ON 02/22 AT 1403: PREVIOUSLY REPORTED AS Pleural Fld   Lactate dehydrogenase (CSF, pleural or peritoneal fluid)     Status: Abnormal   Collection Time: 02/27/15  1:32 PM  Result Value Ref Range   LD, Fluid 1518 (H) 3 - 23 U/L    Comment: (NOTE) Results should be evaluated in conjunction with serum values Performed at [facility]    Fluid Type-FLDH FLUID     Comment: LEFT PLEURAL COLLECTED BY DOCTOR CORRECTED ON 02/22 AT 1404: PREVIOUSLY REPORTED AS Pleural Fld   Culture, body fluid-bottle     Status: None (Preliminary result)   Collection Time: 02/27/15  1:32 PM  Result Value Ref Range   Specimen Description FLUID LEFT PLEURAL    Special Requests BOTTLES DRAWN AEROBIC AND ANAEROBIC 10CC EACH    Gram Stain      FEW WBC PRESENT,BOTH PMN AND MONONUCLEAR NO ORGANISMS SEEN    Culture NO GROWTH 2 DAYS    Report Status PENDING   Gram stain     Status: None (Preliminary result)   Collection Time: 02/27/15  1:32 PM  Result Value Ref Range   Specimen Description  FLUID LEFT PLEURAL    Special Requests NONE    Gram Stain PENDING    Report Status PENDING   Pathologist smear review     Status: None   Collection Time: 02/27/15  1:32 PM  Result Value Ref Range   Path Review BLOOD     Comment: Reviewed by Chrystie Nose. Saralyn Pilar, M.D. 02/28/15. Performed at [facility]   Glucose, capillary     Status: None   Collection Time: 02/28/15  8:03 AM  Result Value Ref Range   Glucose-Capillary 95 65 - 99 mg/dL   Comment 1 Notify RN    Comment 2 Document in Chart   CBC with Differential/Platelet     Status: Abnormal   Collection Time: 02/28/15 10:49 AM  Result Value Ref Range   WBC 8.8 4.0 - 10.5 K/uL   RBC 2.69 (L) 3.87 - 5.11 MIL/uL   Hemoglobin 9.0 (L) 12.0 - 15.0 g/dL    Comment: DELTA CHECK NOTED RESULT REPEATED AND VERIFIED    HCT 26.8 (L) 36.0 - 46.0 %   MCV 99.6 78.0 - 100.0 fL   MCH 33.5 26.0 - 34.0 pg   MCHC 33.6 30.0 - 36.0 g/dL   RDW 16.9 (H) 11.5 - 15.5 %   Platelets 185 150 - 400 K/uL   Neutrophils Relative % 79 %   Neutro Abs 6.9 1.7 - 7.7 K/uL   Lymphocytes Relative 9 %   Lymphs Abs 0.8 0.7 - 4.0 K/uL   Monocytes Relative 12 %   Monocytes Absolute 1.0 0.1 - 1.0 K/uL   Eosinophils Relative 1 %   Eosinophils Absolute 0.1 0.0 - 0.7 K/uL   Basophils Relative 0 %   Basophils Absolute 0.0 0.0 - 0.1 K/uL  Lactic acid, plasma     Status: None   Collection Time: 02/28/15  6:06 PM  Result Value Ref Range   Lactic Acid, Venous 1.7 0.5 - 2.0 mmol/L  Procalcitonin - Baseline     Status: None   Collection Time: 02/28/15  6:06 PM  Result Value Ref Range   Procalcitonin 0.17 ng/mL    Comment:        Interpretation: PCT (Procalcitonin) <= 0.5 ng/mL: Systemic infection (sepsis) is not likely. Local bacterial infection is possible. (NOTE)  ICU PCT Algorithm               Non ICU PCT Algorithm    ----------------------------     ------------------------------         PCT < 0.25 ng/mL                 PCT < 0.1 ng/mL     Stopping  of antibiotics            Stopping of antibiotics       strongly encouraged.               strongly encouraged.    ----------------------------     ------------------------------       PCT level decrease by               PCT < 0.25 ng/mL       >= 80% from peak PCT       OR PCT 0.25 - 0.5 ng/mL          Stopping of antibiotics                                             encouraged.     Stopping of antibiotics           encouraged.    ----------------------------     ------------------------------       PCT level decrease by              PCT >= 0.25 ng/mL       < 80% from peak PCT        AND PCT >= 0.5 ng/mL            Continuin g antibiotics                                              encouraged.       Continuing antibiotics            encouraged.    ----------------------------     ------------------------------     PCT level increase compared          PCT > 0.5 ng/mL         with peak PCT AND          PCT >= 0.5 ng/mL             Escalation of antibiotics                                          strongly encouraged.      Escalation of antibiotics        strongly encouraged.   Basic metabolic panel     Status: Abnormal   Collection Time: 03/01/15  5:10 AM  Result Value Ref Range   Sodium 132 (L) 135 - 145 mmol/L   Potassium 3.2 (L) 3.5 - 5.1 mmol/L   Chloride 93 (L) 101 - 111 mmol/L   CO2 32 22 - 32 mmol/L   Glucose, Bld 105 (H) 65 - 99 mg/dL   BUN 11 6 - 20 mg/dL   Creatinine, Ser 0.40 (L) 0.44 - 1.00 mg/dL  Calcium 8.5 (L) 8.9 - 10.3 mg/dL   GFR calc non Af Amer >60 >60 mL/min   GFR calc Af Amer >60 >60 mL/min    Comment: (NOTE) The eGFR has been calculated using the CKD EPI equation. This calculation has not been validated in all clinical situations. eGFR's persistently <60 mL/min signify possible Chronic Kidney Disease.    Anion gap 7 5 - 15  Glucose, capillary     Status: Abnormal   Collection Time: 03/01/15  8:54 AM  Result Value Ref Range   Glucose-Capillary  171 (H) 65 - 99 mg/dL  Hemoglobin and hematocrit, blood     Status: Abnormal   Collection Time: 03/01/15 10:35 AM  Result Value Ref Range   Hemoglobin 10.0 (L) 12.0 - 15.0 g/dL   HCT 30.7 (L) 36.0 - 46.0 %  Glucose, capillary     Status: Abnormal   Collection Time: 03/01/15 11:33 AM  Result Value Ref Range   Glucose-Capillary 236 (H) 65 - 99 mg/dL      RADIOGRAPHY: Dg Chest 1 View  02/27/2015  CLINICAL DATA:  Endometrial cancer, LEFT pleural effusion post thoracentesis EXAM: CHEST 1 VIEW COMPARISON:  CT chest 02/27/2015 FINDINGS: Subtotal opacification of LEFT hemithorax by combination of loculated pleural effusion and large tumor mass as seen on CT. No pneumothorax following LEFT thoracentesis. Mediastinal shift to the RIGHT. Atelectasis RIGHT lung. Post LEFT mastectomy and axillary node dissection. Bones demineralized. IMPRESSION: No pneumothorax following LEFT thoracentesis. Persistent significant opacification of LEFT hemithorax by combination of known large mass and large pleural effusion. Patient asymptomatic post thoracentesis. Electronically Signed   By: Lavonia Dana M.D.   On: 02/27/2015 14:13   Dg Chest 1v Repeat Same Day  02/27/2015  CLINICAL DATA:  Some worsening in shortness of breath and night. Known lung mass. History of breast cancer. Initial encounter. EXAM: CHEST - 1 VIEW SAME DAY COMPARISON:  Single view of the chest earlier today and 02/14/2015. CT chest 02/27/2015. FINDINGS: There is increased airspace opacity in left upper lung zone since the study earlier today likely due to increased pleural fluid and airspace disease in this patient with a large left chest mass. There is patchy airspace disease in the right lung which is unchanged in appearance. The heart is displaced to the right. Port-A-Cath is again noted. The patient is status post left mastectomy and axillary dissection. IMPRESSION: Some increase in opacity in the left upper lung zone since the examination earlier today  compatible with increased pleural effusion and airspace disease in this patient with a large left chest mass. Electronically Signed   By: Inge Rise M.D.   On: 02/27/2015 19:04   US Thoracentesis Asp Pleural Space W/img Guide  02/27/2015  INDICATION: LEFT pleural effusion, large LEFT lung mass, history breast cancer, endometrial cancer EXAM: ULTRASOUND GUIDED LEFT THORACENTESIS PROCEDURE: Procedure, benefits, and risks of procedure were discussed with patient. Written informed consent for procedure was obtained. Time out protocol followed. Pleural effusion localized by ultrasound at the posterior LEFT hemithorax. Fluid contains numerous internal echoes and appears complex. Skin prepped and draped in usual sterile fashion. Skin and soft tissues anesthetized with 8 mL of 1% lidocaine. 8 French thoracentesis catheter placed into the LEFT pleural space. 200 mL of dark old grossly bloody fluid was aspirated by syringe pump. At this point, flow fluid ceased and could not be restarted likely due to the complicated nature of the fluid and question loculation. Procedure tolerated well by patient without immediate complication. FINDINGS: A  total of approximately 200 mL of bloody LEFT pleural fluid was removed. A fluid sample of 180 mL was sent for laboratory analysis. IMPRESSION: Successful ultrasound guided LEFT thoracentesis yielding 200 mL of pleural fluid. Electronically Signed   By: Lavonia Dana M.D.   On: 02/27/2015 14:10      ASSESSMENT:  Stage IV endometrial Cancer End of life issues Tachycardia Tachypnea Progression of Disease    PLAN:  Unfortunately I do not think we will be able to get her home with hospice. Family and patient are absolutely agreeable to DNR status and transition to comfort only.  They know Tasha and I have requested palliative care consultation for comfort.   Recommend increasing morphine. Ativan as needed prn. Comfort care only. Patient is now DNR.  I will return at the  end of clinic for additional discussion and reassurance. All questions were answered today.  All questions were answered. The patient knows to call the clinic with any problems, questions or concerns. We can certainly see the patient much sooner if necessary. This note was electronically signed. Molli Hazard MD

## 2015-03-01 NOTE — Progress Notes (Signed)
Subjective: No further diarrhea. Denies abdominal pain, N/V. Is having nasal irritation from the oxygen, respiratory to deliver humidifier shortly. Has not ahd a bowel movement in a couple days, states hospitalist said he'll add Miralax to help. Appetite still not great but making herself eat to help get stronger. No further GI complaints.  Objective: Vital signs in last 24 hours: Temp:  [98.7 F (37.1 C)-98.8 F (37.1 C)] 98.7 F (37.1 C) (02/24 0400) Pulse Rate:  [116-131] 124 (02/24 0600) Resp:  [16-30] 16 (02/24 0600) BP: (93-119)/(56-90) 109/63 mmHg (02/24 0600) SpO2:  [95 %-98 %] 96 % (02/24 0749) Weight:  [118 lb 6.2 oz (53.7 kg)] 118 lb 6.2 oz (53.7 kg) (02/24 0500) Last BM Date: 02/27/15 General:   Alert and oriented, pleasant. Seems fatigued. Head:  Normocephalic and atraumatic. Eyes:  No icterus, sclera clear. Conjuctiva pink.  Heart:  S1, S2 present, no murmurs noted.  Lungs: Diminished left > right, bilateral wheezes noted.  Abdomen:  Bowel sounds present, soft, non-tender, non-distended. No HSM or hernias noted. No rebound or guarding. No masses appreciated  Pulses:  Normal bilateral DP pulses noted. Extremities:  Without clubbing or edema. Neurologic:  Alert and  oriented x4;  grossly normal neurologically. Psych:  Alert and cooperative. Normal mood and affect.  Intake/Output from previous day: 02/23 0701 - 02/24 0700 In: 806.3 [I.V.:506.3; IV Piggyback:300] Out: 1100 [Urine:1100] Intake/Output this shift:    Lab Results:  Recent Labs  02/18/2015 1500 02/28/2015 2239 02/28/15 1049  WBC 15.0* 12.9* 8.8  HGB 12.8 11.6* 9.0*  HCT 38.5 34.5* 26.8*  PLT 284 230 185   BMET  Recent Labs  02/13/2015 2239 03/01/15 0510  NA 129* 132*  K 3.5 3.2*  CL 94* 93*  CO2 25 32  GLUCOSE 132* 105*  BUN 15 11  CREATININE 0.60 0.40*  CALCIUM 9.9 8.5*   LFT  Recent Labs  03/05/2015 2239  PROT 5.6*  ALBUMIN 2.4*  AST 29  ALT 17  ALKPHOS 105  BILITOT 0.6    PT/INR  Recent Labs  02/27/15 0633  LABPROT 16.0*  INR 1.26   Hepatitis Panel No results for input(s): HEPBSAG, HCVAB, HEPAIGM, HEPBIGM in the last 72 hours.   Studies/Results: Dg Chest 1 View  02/27/2015  CLINICAL DATA:  Endometrial cancer, LEFT pleural effusion post thoracentesis EXAM: CHEST 1 VIEW COMPARISON:  CT chest 02/27/2015 FINDINGS: Subtotal opacification of LEFT hemithorax by combination of loculated pleural effusion and large tumor mass as seen on CT. No pneumothorax following LEFT thoracentesis. Mediastinal shift to the RIGHT. Atelectasis RIGHT lung. Post LEFT mastectomy and axillary node dissection. Bones demineralized. IMPRESSION: No pneumothorax following LEFT thoracentesis. Persistent significant opacification of LEFT hemithorax by combination of known large mass and large pleural effusion. Patient asymptomatic post thoracentesis. Electronically Signed   By: Lavonia Dana M.D.   On: 02/27/2015 14:13   Dg Chest 1v Repeat Same Day  02/27/2015  CLINICAL DATA:  Some worsening in shortness of breath and night. Known lung mass. History of breast cancer. Initial encounter. EXAM: CHEST - 1 VIEW SAME DAY COMPARISON:  Single view of the chest earlier today and 02/10/2015. CT chest 02/27/2015. FINDINGS: There is increased airspace opacity in left upper lung zone since the study earlier today likely due to increased pleural fluid and airspace disease in this patient with a large left chest mass. There is patchy airspace disease in the right lung which is unchanged in appearance. The heart is displaced to the right. Port-A-Cath  is again noted. The patient is status post left mastectomy and axillary dissection. IMPRESSION: Some increase in opacity in the left upper lung zone since the examination earlier today compatible with increased pleural effusion and airspace disease in this patient with a large left chest mass. Electronically Signed   By: Inge Rise M.D.   On: 02/27/2015 19:04    US Thoracentesis Asp Pleural Space W/img Guide  02/27/2015  INDICATION: LEFT pleural effusion, large LEFT lung mass, history breast cancer, endometrial cancer EXAM: ULTRASOUND GUIDED LEFT THORACENTESIS PROCEDURE: Procedure, benefits, and risks of procedure were discussed with patient. Written informed consent for procedure was obtained. Time out protocol followed. Pleural effusion localized by ultrasound at the posterior LEFT hemithorax. Fluid contains numerous internal echoes and appears complex. Skin prepped and draped in usual sterile fashion. Skin and soft tissues anesthetized with 8 mL of 1% lidocaine. 8 French thoracentesis catheter placed into the LEFT pleural space. 200 mL of dark old grossly bloody fluid was aspirated by syringe pump. At this point, flow fluid ceased and could not be restarted likely due to the complicated nature of the fluid and question loculation. Procedure tolerated well by patient without immediate complication. FINDINGS: A total of approximately 200 mL of bloody LEFT pleural fluid was removed. A fluid sample of 180 mL was sent for laboratory analysis. IMPRESSION: Successful ultrasound guided LEFT thoracentesis yielding 200 mL of pleural fluid. Electronically Signed   By: Lavonia Dana M.D.   On: 02/27/2015 14:10    Assessment: 70 year old female unfortunately with stage IV endometrial cancer, metastasis to lung with rapidly enlarging left lung mass, malignant pleural effusion. Recent profuse diarrhea, reportedly black although sister states it was dark green (on iron therapy), was heme+. C. difficile quick screen showed positive antigen, negative toxin. In the setting of chemotherapy, acute onset profuse diarrhea agree with treating for C. difficile colitis. She is currently on oral vancomycin 125 mg every 6 hours. Agree with avoiding PPI and utilizing Pepcid. Cannot exclude chemo related diarrhea. Really not clear that she has had significant GI bleeding. She is heme positive,  hemoglobin has been stable this admission although it is noted that she has required blood transfusion recently (in setting of chemotherapy).   Her hgb yesterday decreased to 9.0 from 11.6 three days ago. Leucocytosis improved. Also with decrease in BUN/Cr, possibly some hydration/dilution effect with near 2.5 L IV intake from 2/22-2/23.   Diarrhea remains resolved at this time, on Vanco for C. Diff infection. Per staff no blood seen at all. Per patient no bowel movement in a couple days. No hematemesis. Appears clinically improved from a GI standpoint.     Plan: 1. Continue to monitor hemoglobin, recheck today due to drop yesterday from previous. 2. Avoid endoscopic evaluation unless clear evidence of significant GI bleed. 3. Continue Pepcid 4. Follow for GI path panel results 5. Supportive measures   Walden Field, AGNP-C Adult & Gerontological Nurse Practitioner [facility]    LOS: 2 days    03/01/2015, 8:27 AM

## 2015-03-01 NOTE — Care Management Important Message (Signed)
Important Message  Patient Details  Name: Alejandra Callahan MRN: [id number] Date of Birth: 05-Dec-1945   Medicare Important Message Given:  Yes    Sherald Barge, RN 03/01/2015, 4:09 PM

## 2015-03-01 NOTE — Care Management (Signed)
Pt is from home with [facility] for [facility] services. Pt with poor prognosis, will cont to follow.

## 2015-03-02 ENCOUNTER — Encounter (HOSPITAL_COMMUNITY): Payer: Self-pay | Admitting: Hematology & Oncology

## 2015-03-02 ENCOUNTER — Inpatient Hospital Stay (HOSPITAL_COMMUNITY): Payer: Medicare Other

## 2015-03-02 LAB — BASIC METABOLIC PANEL
Anion gap: 7 (ref 5–15)
BUN: 8 mg/dL (ref 6–20)
CO2: 33 mmol/L — ABNORMAL HIGH (ref 22–32)
Calcium: 8.8 mg/dL — ABNORMAL LOW (ref 8.9–10.3)
Chloride: 95 mmol/L — ABNORMAL LOW (ref 101–111)
Creatinine, Ser: 0.31 mg/dL — ABNORMAL LOW (ref 0.44–1.00)
GFR calc Af Amer: >60 mL/min (ref >60)
GFR calc non Af Amer: >60 mL/min (ref >60)
Glucose, Bld: 131 mg/dL — ABNORMAL HIGH (ref 65–99)
Potassium: 3.5 mmol/L (ref 3.5–5.1)
Sodium: 135 mmol/L (ref 135–145)

## 2015-03-02 LAB — GLUCOSE, CAPILLARY: Glucose-Capillary: 124 mg/dL — ABNORMAL HIGH (ref 65–99)

## 2015-03-02 LAB — CBC
HCT: 28.1 % — ABNORMAL LOW (ref 36.0–46.0)
Hemoglobin: 9.3 g/dL — ABNORMAL LOW (ref 12.0–15.0)
MCH: 33.7 pg (ref 26.0–34.0)
MCHC: 33.1 g/dL (ref 30.0–36.0)
MCV: 101.8 fL — ABNORMAL HIGH (ref 78.0–100.0)
Platelets: 180 10*3/uL (ref 150–400)
RBC: 2.76 MIL/uL — ABNORMAL LOW (ref 3.87–5.11)
RDW: 17.1 % — ABNORMAL HIGH (ref 11.5–15.5)
WBC: 9 10*3/uL (ref 4.0–10.5)

## 2015-03-02 LAB — MAGNESIUM: MAGNESIUM: 2.1 mg/dL (ref 1.7–2.4)

## 2015-03-02 LAB — PROCALCITONIN: PROCALCITONIN: 0.41 ng/mL

## 2015-03-02 MED ORDER — LORAZEPAM 2 MG/ML IJ SOLN
1.0000 mg | INTRAMUSCULAR | Status: DC | PRN
  Administered 2015-03-03 (×2): 1 mg via INTRAVENOUS
  Filled 2015-03-02 (×2): qty 1

## 2015-03-02 MED ORDER — METOPROLOL TARTRATE 1 MG/ML IV SOLN
2.5000 mg | Freq: Two times a day (BID) | INTRAVENOUS | Status: DC
  Administered 2015-03-02 – 2015-03-03 (×2): 2.5 mg via INTRAVENOUS
  Filled 2015-03-02 (×2): qty 5

## 2015-03-02 NOTE — Progress Notes (Signed)
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 Final immunophenotype c/w endometrial  CURRENT THERAPY: Observation  HISTORY OF PRESENTING ILLNESS:  Alejandra Callahan 70 y.o. female is here for further follow-up of stage IV endometrial cancer.  She is accompanied today by her son, daughter and several sisters. She has just been discharged from the inpatient service at [facility]. Plan is for potential hospice but family and patient come here today to discuss the possibility of trying second line therapy. She understands that she has gotten very weak "very quickly". No one is opposed to hospice. [name] feels however that she wants to try additional therapy if possible.    Endometrial ca ([place])   11/28/2012 Initial Diagnosis Endometrial ca, Embx Grade 2   12/09/2012 Surgery IAG3, close follow up   07/05/2014 Imaging Chest Xray- multiple new pulmonary nodules consistent with pulmonary metastases.   07/13/2014 PET scan Multiple hypermetabolic pulmonary nodules and masses of varying size in the lungs bilaterally. Imaging features are compatible with metastatic disease.   07/24/2014 Pathology Results Lung, needle/core biopsy(ies), right - ADENOCARCINOMA.   08/06/2014 Procedure Port placed by IR   08/09/2014 - 11/22/2014 Chemotherapy Carboplatin/Paclitaxel   10/10/2014 PET scan Interval response to therapy with decrease in size and hypermetabolism of the patient's multiple  pulmonary nodules. No new or progressive pulmonary lesions.   12/06/2014 PET scan Interval progression of LUL pulmonary metastases. No significant change in small right lung mets. No other sites of metastatic disease identified   01/14/2015 PET scan Significant interval progrssion of hypermetabolic LUL lung mass, new small malignant L pleural effusion with associated left pleural thickening and hypermetab anteriorly adjacent to LUL mass, Mildly Hypermet L mediatinal adenop   01/18/2015 Pathology Results CT biopsy with benign lung parenchyma with associated hemorrhage and necrosis   02/04/2015 Pathology Results naviational bronchoscopy with Dr. Roxan Hockey with benign lung tissue   02/06/2015 - 02/14/2015 Hospital Admission Acute respiratory failure with hypoxia, L hydropneumothorax, VATS; final pathology c/w endometrial carcinoma     MEDICAL HISTORY:  Past Medical History  Diagnosis Date  . Cancer (HCC)     breast  . Arthritis     Rheumatoid arthritis  . PONV (postoperative nausea and vomiting) 1982  . Breast cancer ([place]) 09/1980    left unilateral mastectomy, no chemo or radiatioin  . Endometrial cancer ([place]) 2014    mets to the lung  . Lung mass   . Pneumonia     hx    SURGICAL HISTORY: Past Surgical History  Procedure Laterality Date  . Mastectomy Left 09/1980  . Tubal ligation  April 1982  . Dilatation & currettage/hysteroscopy with resectocope N/A 11/18/2012    Procedure: DILATATION & CURETTAGE/HYSTEROSCOPY WITH RESECTOCOPE;  Surgeon: Marvene Staff, MD;  Location: [facility];  Service: Gynecology;  Laterality: N/A;  . Breast biopsy Left 09/11/80  . Vaginal hysterectomy  12/09/2012    Complete hysterectomy - [place] placement Right 8/16  . Abdominal hysterectomy    . Lung biopsy  01/18/15  . Video bronchoscopy with  endobronchial navigation N/A 02/04/2015    Procedure: VIDEO BRONCHOSCOPY WITH ENDOBRONCHIAL NAVIGATION;  Surgeon: [name], MD;  Location: [facility];  Service: Thoracic;  Laterality: N/A;  . Video assisted thoracoscopy Left 02/08/2015    Procedure: VIDEO ASSISTED THORACOSCOPY, lung biopsy;  Surgeon: [name], MD;  Location: [place];  Service: Thoracic;  Laterality: Left;  . Pleural effusion drainage Left 02/08/2015    Procedure: DRAINAGE OF PLEURAL EFFUSION;  Surgeon: [name], MD;  Location: [facility];  Service: Thoracic;  Laterality: Left;    SOCIAL HISTORY: Social History   Social History  . Marital Status: Married    Spouse Name: N/A  . Number of Children: 2  . Years of Education: N/A   Occupational History  . Not on file.   Social History Main Topics  . Smoking status: Never Smoker   . Smokeless tobacco: Never Used  . Alcohol Use: No  . Drug Use: No  . Sexual Activity: Not on file   Other Topics Concern  . Not on file   Social History Narrative  Married, 6 years 2 grandchildren, 2 children Ex-teacher and Microbiologist Non-smoker ETOH, none  FAMILY HISTORY: Family History  Problem Relation Age of Onset  . Breast cancer Mother 77  . Heart attack Mother 92  . Colon cancer Father 28  . Heart attack Father 52  . Breast cancer Sister 15  . Heart attack Paternal Grandfather 1  . Breast cancer Other     mother's paternal first cousin dx in late 40s-early 78s   indicated that her mother is deceased. She indicated that her father is deceased. She indicated that both of her sisters are alive. She indicated that her maternal grandmother is deceased. She indicated that her maternal grandfather is deceased. She indicated that her paternal grandmother is deceased. She indicated that her paternal grandfather is deceased. She indicated that her other is deceased.  Mother deceased at 96, heart attack Father deceased at 44, congested heart failure 2 sisters, one with breast cancer  ALLERGIES:  is allergic to codeine; acyclovir and related; other; tuberculin ppd; and tylox.  MEDICATIONS:  No  current facility-administered medications for this visit.   No current outpatient prescriptions on file.   Facility-Administered Medications Ordered in Other Visits  Medication Dose Route Frequency Provider Last Rate Last Dose  . 0.9 %  sodium chloride infusion   Intravenous Continuous Barton Dubois, MD 20 mL/hr at 03/02/15 1702    . acetaminophen (TYLENOL) tablet 500 mg  500 mg Oral Q6H PRN Vianne Bulls, MD      . albuterol (PROVENTIL) (2.5 MG/3ML) 0.083% nebulizer solution 2.5 mg  2.5 mg Nebulization Q3H PRN Barton Dubois, MD   2.5 mg at 03/02/15 0102  . benzonatate (TESSALON) capsule 200 mg  200 mg Oral TID PRN Vianne Bulls, MD   200 mg at 03/02/15 0135  . budesonide (PULMICORT) nebulizer solution 0.25 mg  0.25 mg Nebulization BID Barton Dubois, MD   0.25 mg at 03/02/15 0841  . famotidine (PEPCID) IVPB 20 mg premix  20 mg Intravenous Q12H Barton Dubois, MD   20 mg at 03/02/15 0934  . feeding supplement (ENSURE ENLIVE) (ENSURE ENLIVE) liquid 237 mL  237 mL Oral BID BM Ilene Qua Opyd, MD   237 mL at 02/28/15 1711  . hydrocortisone sodium succinate (SOLU-CORTEF) 100 MG injection 50 mg  50 mg Intravenous Q8H Barton Dubois, MD   50 mg at 03/02/15 1446  . levalbuterol (  XOPENEX) nebulizer solution 0.63 mg  0.63 mg Nebulization BID Barton Dubois, MD   0.63 mg at 03/02/15 0841  . LORazepam (ATIVAN) injection 1 mg  1 mg Intravenous Q4H PRN Barton Dubois, MD      . metoprolol (LOPRESSOR) injection 2.5 mg  2.5 mg Intravenous Q12H Barton Dubois, MD   2.5 mg at 03/02/15 1747  . morphine 2 MG/ML injection 1 mg  1 mg Intravenous Q1H PRN Patrici Ranks, MD   1 mg at 03/02/15 1748  . morphine CONCENTRATE 10 MG/0.5ML oral solution 10 mg  10 mg Oral Q3H PRN Vianne Bulls, MD   10 mg at 03/02/15 0939  . ondansetron (ZOFRAN) tablet 4 mg  4 mg Oral Q6H PRN Vianne Bulls, MD   4 mg at 02/28/15 2542   Or  . ondansetron (ZOFRAN) injection 4 mg  4 mg Intravenous Q6H PRN Ilene Qua Opyd, MD      . polyethylene  glycol (MIRALAX / GLYCOLAX) packet 17 g  17 g Oral Daily PRN Barton Dubois, MD      . sodium chloride flush (NS) 0.9 % injection 3 mL  3 mL Intravenous Q12H Vianne Bulls, MD   3 mL at 03/01/15 2355  . vancomycin (VANCOCIN) 50 mg/mL oral solution 125 mg  125 mg Oral 4 times per day Barton Dubois, MD   125 mg at 03/01/15 2345    Review of Systems  Cardiovascular: Positive for chest pain.     Pain in the left upper chest, most likely cancer related. Respiratory: Positive for cough, SOB.     Worsened. Musculoskeletal: Positive for shoulder pain, intermittent  All other systems reviewed and are negative.  14 point ROS was done and is otherwise as detailed above or in HPI  PHYSICAL EXAMINATION: ECOG PERFORMANCE STATUS: 0 - Asymptomatic  Vitals - 1 value per visit 7/0/6237  SYSTOLIC 628  DIASTOLIC 58  Pulse 99  Temperature 97.8  Respirations 23  Weight (lb)      Physical Exam  Constitutional: She is oriented to person, place, and time  In a wheelchair, wearing O2 HENT:  Head: Normocephalic and atraumatic.  Nose: Nose normal.  Mouth/Throat: Oropharynx is clear and moist. No oropharyngeal exudate.  Eyes: Conjunctivae and EOM are normal. Pupils are equal, round, and reactive to light. Right eye exhibits no discharge. Left eye exhibits no discharge. No scleral icterus.  Neck: Normal range of motion. Neck supple. No tracheal deviation present. No thyromegaly present.  Cardiovascular: Tachycardic regular rhythm and normal heart sounds.  Exam reveals no gallop and no friction rub.   No murmur heard. Pulmonary/Chest: Decreased breath sounds in the left upper lobe. She has no wheezes. She has no rales.  Abdominal: Soft. Bowel sounds are normal. She exhibits no distension and no mass. There is no tenderness. There is no rebound and no guarding.  Musculoskeletal: Normal range of motion. Lymphadenopathy:    She has no cervical adenopathy.  Neurological: She is alert and oriented to person,  place, and time.  Skin: Skin is warm and dry. No rash noted.  Psychiatric: Mood, memory, affect and judgment normal.     RADIOLOGY: I have personally reviewed the radiological images as listed and agreed with the findings in the report.  CLINICAL DATA: Subsequent treatment strategy for endometrial carcinoma diagnosed in November 2014, status post TAHBSO on 12/09/2012, with lung metastases detected in July 2016, status post chemotherapy, with progression of left upper lobe pulmonary metastasis described on prior PET-CT.  Remote history of left breast cancer.  EXAM: NUCLEAR MEDICINE PET SKULL BASE TO THIGH  TECHNIQUE: 12.3 mCi F-18 FDG was injected intravenously. Full-ring PET imaging was performed from the skull base to thigh after the radiotracer. CT data was obtained and used for attenuation correction and anatomic localization.  FASTING BLOOD GLUCOSE: Value: 81 mg/dl  COMPARISON: 12/06/2014 PET-CT.  FINDINGS: NECK  No hypermetabolic lymph nodes in the neck.  CHEST  There is an irregular hypermetabolic anterior medial left upper lobe lung mass (series 3/image 81) with max SUV 15.0, which has significantly increased in size and metabolism, previously 3.6 x 2.8 cm with max SUV 9.0. There is new hypermetabolism with associated pleural thickening within the anterior left pleural space with max SUV 4.6, with associated small layering left pleural effusion, in keeping with a malignant left pleural effusion.  There are 3 subcentimeter right upper lobe pulmonary nodules measuring up to 0.8 cm in size (series 3/image 60), which are unchanged in size and demonstrate no significant metabolism.  There are newly mildly hypermetabolic subcentimeter left mediastinal lymph nodes, including a 0.7 cm AP window node (series 3/image 65) with max SUV 2.6, a 0.7 cm prevascular node (series 3/image 65) with max SUV 2.3 and a 0.7 cm upper left paratracheal node  (series 3/image 53) with max SUV 2.3.  No hypermetabolic axillary nodes. Left axillary nodal dissection clips are again noted. Stable appearance of the chest wall status post left mastectomy. No hypermetabolic superficial chest wall masses. Right internal jugular MediPort terminates at the cavoatrial junction.  ABDOMEN/PELVIS  No abnormal hypermetabolic activity within the liver, pancreas, adrenal glands, or spleen. No hypermetabolic lymph nodes in the abdomen or pelvis. Stable non hypermetabolic 2.2 cm inferior right liver lobe cyst. Hysterectomy.  SKELETON  No focal hypermetabolic activity to suggest skeletal metastasis. Previously described reactive marrow uptake has resolved.  IMPRESSION: 1. Significant interval progression of hypermetabolic left upper lobe lung mass. 2. New small malignant left pleural effusion with associated left pleural thickening and hypermetabolism anteriorly adjacent to the progressive left upper lobe lung mass. 3. Newly mildly hypermetabolic left mediastinal lymphadenopathy, likely representing mediastinal nodal metastases. 4. Right pulmonary nodules are stable and non hypermetabolic. 5. No hypermetabolic metastatic disease in the neck, abdomen or pelvis. No hypermetabolic bone metastases.   Electronically Signed  By: Ilona Sorrel M.D.  On: 01/14/2015 13:06  LABORATORY DATA:  Results for Alejandra Callahan, Alejandra Callahan (MRN [id number])   Ref. Range 02/14/2015 03:30  Sodium Latest Ref Range: 135-145 mmol/L 135  Potassium Latest Ref Range: 3.5-5.1 mmol/L 4.2  Chloride Latest Ref Range: 101-111 mmol/L 98 (L)  CO2 Latest Ref Range: 22-32 mmol/L 30  BUN Latest Ref Range: 6-20 mg/dL 5 (L)  Creatinine Latest Ref Range: 0.44-1.00 mg/dL 0.43 (L)  Calcium Latest Ref Range: 8.9-10.3 mg/dL 8.7 (L)  EGFR (Non-African Amer.) Latest Ref Range: >60 mL/min >60  EGFR (African American) Latest Ref Range: >60 mL/min >60  Glucose Latest Ref Range: 65-99 mg/dL 130 (H)   Anion gap Latest Ref Range: 5-15  7  Magnesium Latest Ref Range: 1.7-2.4 mg/dL 2.0  WBC Latest Ref Range: 4.0-10.5 K/uL 12.4 (H)  RBC Latest Ref Range: 3.87-5.11 MIL/uL 2.37 (L)  Hemoglobin Latest Ref Range: 12.0-15.0 g/dL 8.0 (L)  HCT Latest Ref Range: 36.0-46.0 % 24.8 (L)  MCV Latest Ref Range: 78.0-100.0 fL 104.6 (H)  MCH Latest Ref Range: 26.0-34.0 pg 33.8  MCHC Latest Ref Range: 30.0-36.0 g/dL 32.3  RDW Latest Ref Range: 11.5-15.5 % 15.9 (H)  Platelets Latest Ref Range: 150-400 K/uL 242     IMAGING/RADIOLOGY  Transthoracic Echocardiography  Patient:  Alejandra Callahan, Alejandra Callahan MR #:    [id number] Study Date: 02/13/2015 Gender:   F Age:    36 Height:   149.9 cm Weight:   54.9 kg BSA:    1.52 m^2 Pt. Status: Room:    3S14C  ORDERING   [place] Charon, MD REFERRING  [place]esto Charon, MD ADMITTING  Rise Patience ATTENDING  Thurnell Lose SONOGRAPHER Maudry Mayhew, RVT, RDCS, RDMS PERFORMING  [facility], Inpatient  cc:  ------------------------------------------------------------------- LV EF: 65% -  70%  ------------------------------------------------------------------- Study Conclusions  - Left ventricle: The cavity size was normal. Wall thickness was normal. Systolic function was vigorous. The estimated ejection fraction was in the range of 65% to 70%. - Left atrium: The atrium was mildly dilated. - Pericardium, extracardiac: There was no pericardial effusion. There was a left pleural effusion. There is a large extra cardiac mass along left ventricular free wall.        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 Normal CA-125 PET/CT 01/14/2015 with further progression LUL metastases Final Pathology 02/08/2015 c/w endometrial carcinoma Hydropneumothorax Declining, poor PS   We discussed the likelihood of second line therapy providing her benefit, considering that her disease began to progress at the end of front line therapy.  The patient and family understand that there is a low likelihood of response but still wish to try treatment. I expressed my concerns about her PS and her ability to tolerate additional therapy as well.  Plan will be for single agent adriamycin. ECHO was reviewed. We will proceed with weekly dosing.  She will be transfused with 2 U PRBC given SOB, anemia and ongoing antineoplastic therapy.  Family and patient are in agreement that if she continues to decline and not improve over next several weeks, transition will be made to palliative/hospice care at home.  All questions were answered. The patient knows to call the clinic with any problems, questions or concerns.   This document serves as a record of services personally performed by Ancil Linsey, MD. It was created on her behalf by Arlyce Harman, a trained medical scribe. The creation of this record is based on the scribe's personal observations and the provider's statements to them. This document has been checked and approved by the attending provider.  I have reviewed the above documentation for accuracy and completeness, and I agree with the above.  This note was electronically signed.    Kelby Fam. Whitney Muse, MD

## 2015-03-02 NOTE — Progress Notes (Signed)
[facility] PROGRESS NOTE  Alejandra Callahan IWL:[id number] DOB: 02-05-1945 DOA: 03/02/2015 PCP: [name], MD  Assessment/Plan: 1-C. difficile colitis: Patient with left lower quadrant pain, nausea, vomiting, diarrhea and CT scan demonstrating colonic inflammation.  -Significant improvement in patient's GI symptoms. After discussing with family the plan is to complete a 10 days course of oral vancomycin. -Call for feeding and orbital symptomatic care/comfort care. -Patient will be moved to MedSurg.. -Palliative care consultation in place -Overall poor prognosis and if she remains stable anticipate discharge to a residential hospice.  2-acute on chronic respiratory failure with hypoxia: Appears to be secondary to malignant pleural effusion and post obstructive pneumonia -Patient is status post thoracentesis; but with repeated x-ray demonstrating complete opacification of the left lung field. -Fluid cultures are still pending; but laboratory work demonstrated an exudate in nature. -At this moment Zosyn has been discontinue and the patient essentially transition to full home for/symptomatic management. -Will follow clinical response  3-sirs/sepsis: secondary to C. difficile colitis versus post obstructive pneumonia  -At this particular moment plan is for symptomatic management and comfort care -Zosyn has been discontinued and per family request since she has demonstrated improvement in her diarrhea and abdominal symptoms is to complete a 10 days course of oral vancomycin. -Follow symptoms and provide supportive care.  4-black stools and positive fecal occult blood tests: Most likely secondary to ongoing colitis -GI has been consulted but that is no plan for any endoscopic procedures currently -Will continue IV Pepcid for another 24 hours. -No further labs, plan is for symptomatic management and comfort care.  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 -Palliative care consult in place  6-moderate protein calorie malnutrition: Will continue feeding supplements and comfort feeding  Code Status: Full code Family Communication: Daughter and son at bedside Disposition Plan: Will transfer to med surge bed. Discontinue telemetry. After discussion with oncology service plan is to focus on comfort care and symptomatic management. Will complete a total 10 days oral vancomycin as requested by family since she has demonstrated improvement in her diarrhea. Continue Ativan, morphine and PRN medication for symptoms management. Palliative care has been consulted and will follow their recommendations. Anticipation is for discharge to residential hospice most likely on Monday if patient remains stable.  Consultants:  GI service  Oncology service  Palliative care   Procedures:  See below for x-ray reports  Thoracentesis  (2/22)  Antibiotics:  Zosyn 2/23>>>>2/25  Oral vancomycin 2/22 (looking to complete 10 days as per family request.)  HPI/Subjective: Afebrile, breathing is overall stable/somewhat labor;  Mild tachypnea, but without use of accessory muscles. Patient has also remained tachycardic. Denies CP, abd pain, nausea, vomiting.  After meeting with oncology services plan has been switched to comfort care and symptomatic management. Palliative care consultation in place.  Objective: Filed Vitals:   03/02/15 0800 03/02/15 0802  BP: 121/67   Pulse: 118   Temp:  97.8 F (36.6 C)  Resp: 24     Intake/Output Summary (Last 24 hours) at 03/02/15 1704 Last data filed at 03/02/15 1000  Gross per 24 hour  Intake   1550 ml  Output   1200 ml  Net    350 ml   Filed Weights   02/28/15 0500 03/01/15 0500 03/02/15 0400  Weight: 52.5 kg (115 lb 11.9 oz) 53.7 kg (118 lb 6.2 oz) 53.7 kg (118 lb 6.2 oz)    Exam:  General:  Afebrile, breathing is overall  stable/somewhat labor;  Mild tachypnea, but without use of accessory muscles. Patient has also remained tachycardic. Denies CP, abd pain, nausea, vomiting.  After meeting with oncology services plan has been switched to comfort care and symptomatic management. Palliative care consultation in place.  Cardiovascular: Sinus tachycardia, no rubs, no gallops, no JVD  Respiratory: No wheezing, scattered rhonchi, decreased breath sounds on her left lung field (completely).   Abdomen: Soft, mild tenderness left lower quadrant; positive bowel sounds; no guarding and no distention.  Musculoskeletal: No edema, no cyanosis  Data Reviewed: Basic Metabolic Panel:  Recent Labs Lab 02/07/2015 2239 03/01/15 0510 03/02/15 0500  NA 129* 132* 135  K 3.5 3.2* 3.5  CL 94* 93* 95*  CO2 25 32 33*  GLUCOSE 132* 105* 131*  BUN '15 11 8  '$ CREATININE 0.60 0.40* 0.31*  CALCIUM 9.9 8.5* 8.8*  MG  --   --  2.1   Liver Function Tests:  Recent Labs Lab 02/07/2015 2239  AST 29  ALT 17  ALKPHOS 105  BILITOT 0.6  PROT 5.6*  ALBUMIN 2.4*    Recent Labs Lab 02/17/2015 2239  LIPASE 16   CBC:  Recent Labs Lab 02/28/2015 1500 02/16/2015 2239 02/28/15 1049 03/01/15 1035 03/02/15 0500  WBC 15.0* 12.9* 8.8  --  9.0  NEUTROABS 13.7* 10.7* 6.9  --   --   HGB 12.8 11.6* 9.0* 10.0* 9.3*  HCT 38.5 34.5* 26.8* 30.7* 28.1*  MCV 100.5* 99.7 99.6  --  101.8*  PLT 284 230 185  --  180   Cardiac Enzymes:  Recent Labs Lab 02/23/2015 2341  TROPONINI <0.03   BNP (last 3 results)  Recent Labs  02/06/15 1918  BNP 61.0   CBG:  Recent Labs Lab 02/27/15 0829 02/28/15 0803 03/01/15 0854 03/01/15 1133 03/02/15 0735  GLUCAP 111* 95 171* 236* 124*    Recent Results (from the past 240 hour(s))  Blood Culture (routine x 2)     Status: None (Preliminary result)   Collection Time: 02/19/2015 11:41 PM  Result Value Ref Range Status   Specimen Description BLOOD RIGHT ANTECUBITAL  Final   Special Requests   Final     BOTTLES DRAWN AEROBIC AND ANAEROBIC AEB=6CC ANA=5CC   Culture NO GROWTH 2 DAYS  Final   Report Status PENDING  Incomplete  Blood Culture (routine x 2)     Status: None (Preliminary result)   Collection Time: 02/25/2015 11:59 PM  Result Value Ref Range Status   Specimen Description BLOOD PORTA CATH DRAWN BY RN RH  Final   Special Requests BOTTLES DRAWN AEROBIC AND ANAEROBIC 10CC EACH  Final   Culture NO GROWTH 2 DAYS  Final   Report Status PENDING  Incomplete  Urine culture     Status: None   Collection Time: 02/27/15  1:25 AM  Result Value Ref Range Status   Specimen Description URINE, CLEAN CATCH  Final   Special Requests NONE  Final   Culture   Final    MULTIPLE SPECIES PRESENT, SUGGEST RECOLLECTION Performed at [facility]    Report Status 02/28/2015 FINAL  Final  C difficile quick scan w PCR reflex     Status: Abnormal   Collection Time: 02/27/15 11:01 AM  Result Value Ref Range Status   C Diff antigen POSITIVE (A) NEGATIVE Final   C Diff toxin NEGATIVE NEGATIVE Final   C Diff interpretation   Final    C. difficile present, but toxin  not detected. This indicates colonization. In most cases, this does not require treatment. If patient has signs and symptoms consistent with colitis, consider treatment. Requires ENTERIC precautions.  Culture, body fluid-bottle     Status: None (Preliminary result)   Collection Time: 02/27/15  1:32 PM  Result Value Ref Range Status   Specimen Description FLUID LEFT PLEURAL  Final   Special Requests BOTTLES DRAWN AEROBIC AND ANAEROBIC 10CC EACH  Final   Gram Stain   Final    FEW WBC PRESENT,BOTH PMN AND MONONUCLEAR NO ORGANISMS SEEN    Culture NO GROWTH 2 DAYS  Final   Report Status PENDING  Incomplete  Gram stain     Status: None (Preliminary result)   Collection Time: 02/27/15  1:32 PM  Result Value Ref Range Status   Specimen Description FLUID LEFT PLEURAL  Final   Special Requests NONE  Final   Gram Stain PENDING  Incomplete    Report Status PENDING  Incomplete     Studies: Dg Chest 1 View  03/02/2015  CLINICAL DATA:  Shortness of breath EXAM: CHEST 1 VIEW COMPARISON:  February 27, 2015 FINDINGS: The left lung is almost completely opacified with minimal aerated lung in the left apex. Decreasing lung volumes also seen on the right. A right Port-A-Cath is stable. No other interval changes. IMPRESSION: Near complete opacification of the left hemithorax, worsened in the interval. Decreasing lung volumes on the right. The cardiomediastinal silhouette is shifted to the right. Electronically Signed   By: Dorise Bullion III M.D   On: 03/02/2015 08:42    Scheduled Meds: . budesonide (PULMICORT) nebulizer solution  0.25 mg Nebulization BID  . cholecalciferol  1,000 Units Oral QHS  . famotidine (PEPCID) IV  20 mg Intravenous Q12H  . feeding supplement (ENSURE ENLIVE)  237 mL Oral BID BM  . hydrocortisone sod succinate (SOLU-CORTEF) inj  50 mg Intravenous Q8H  . levalbuterol  0.63 mg Nebulization BID  . metoprolol  2.5 mg Intravenous Q12H  . sodium chloride flush  3 mL Intravenous Q12H  . vancomycin  125 mg Oral 4 times per day   Continuous Infusions: . 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 If 7PM-7AM, please contact night-coverage at [email or web address], password [facility] 03/02/2015, 5:04 PM  LOS: 3 days

## 2015-03-03 DIAGNOSIS — C7800 Secondary malignant neoplasm of unspecified lung: Secondary | ICD-10-CM

## 2015-03-03 MED ORDER — FUROSEMIDE 10 MG/ML IJ SOLN
20.0000 mg | Freq: Once | INTRAMUSCULAR | Status: AC
  Administered 2015-03-03: 20 mg via INTRAVENOUS
  Filled 2015-03-03: qty 2

## 2015-03-03 MED ORDER — MORPHINE SULFATE 2 MG/ML IJ SOLN
2.0000 mg | INTRAMUSCULAR | Status: DC | PRN
  Administered 2015-03-03 (×4): 2 mg via INTRAVENOUS
  Filled 2015-03-03 (×5): qty 1

## 2015-03-03 NOTE — Progress Notes (Signed)
[facility] PROGRESS NOTE  Alejandra Callahan OEV:[id number] DOB: 1945-05-28 DOA: 02/13/2015 PCP: [name], MD  Assessment/Plan: 1-C. difficile colitis: Patient with left lower quadrant pain, nausea, vomiting, diarrhea and CT scan demonstrating colonic inflammation.  -Significant improvement in patient's GI symptoms. Given further decline and patient inability to tolerate well oral regimen. Discussed with family members and we will be stopping any medication no intended for symptom management and comfort care. -Call for feeding and orbital symptomatic care/comfort care. -Patient will be moved to MedSurg.. -Palliative care consultation in place -Overall poor prognosis and if she remains stable anticipate discharge to a residential hospice (hopefully tomorrow (2/27).  2-acute on chronic respiratory failure with hypoxia: Appears to be secondary to malignant pleural effusion and post obstructive pneumonia -Patient is status post thoracentesis; but with repeated x-ray demonstrating complete opacification of the left lung field. -Fluid cultures are still pending; but laboratory work demonstrated an exudate in nature. -At this moment Zosyn has been discontinue and the patient essentially transition to full comfort care/symptomatic management. -Will follow clinical response  3-sirs/sepsis: secondary to C. difficile colitis versus post obstructive pneumonia  -At this particular moment plan is for symptomatic management and comfort care -Zosyn has been discontinued and and after discussing with family and understanding further decline and with no intention for any curative approach; will also discontinue oral vancomycin. -Main goal is comfort care.  4-black stools and positive fecal occult blood tests: Most likely secondary to ongoing colitis -GI has been consulted but that is no plan for any endoscopic procedures currently -Will continue IV Pepcid for another 24 hours. -No further labs, plan is  for symptomatic management and comfort care.  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 -Palliative care consult in place -Family updated and all questions and answered   6-moderate protein calorie malnutrition: Will continue feeding supplements and comfort feeding as tolerated  Code Status: Full code Family Communication: Daughter and son at bedside Disposition Plan: Will transfer to med surge bed. Discontinue telemetry. After discussion with oncology service plan is to focus on comfort care and symptomatic management. Will complete a total 10 days oral vancomycin as requested by family since she has demonstrated improvement in her diarrhea. Continue Ativan, morphine and PRN medication for symptoms management. Palliative care has been consulted and will follow their recommendations. Anticipation is for discharge to residential hospice most likely on Monday if patient remains stable.  Consultants:  GI service  Oncology service  Palliative care   Procedures:  See below for x-ray reports  Thoracentesis  (2/22)  Antibiotics:  Zosyn 2/23>>>>2/25  Oral vancomycin 2/22 (looking to complete 10 days as per family request.)>>>2/26 (patient continued declining and the family understand futility of any medication other than the other ones for symptom management/comfort care. Reason why vancomycin has now been discontinue.)  HPI/Subjective: Afebrile, breathing is overall stable/somewhat labor. Remains tachypneic and tachycardic. Overnight experiencing episodes of shortness of breath the partially improve after Lasix given.   Objective: Filed Vitals:   02/25/2015 0800 02/15/2015 1207  BP: 128/74   Pulse: 122   Temp:  98.5 F (36.9 C)  Resp: 25     Intake/Output Summary (Last 24 hours) at 02/12/2015 1224 Last data filed at 02/09/2015 1026  Gross per 24 hour  Intake    400 ml   Output   1700 ml  Net  -1300 ml   Filed Weights   02/28/15 0500  03/01/15 0500 03/02/15 0400  Weight: 52.5 kg (115 lb 11.9 oz) 53.7 kg (118 lb 6.2 oz) 53.7 kg (118 lb 6.2 oz)    Exam:   General:  Afebrile, breathing is overall stable/somewhat labor with minimal exertion. Mild tachypnea, but without use of accessory muscles currently. Patient has also remained tachycardic. No nausea, no vomiting, no further diarrhea.  Cardiovascular: Sinus tachycardia, no rubs, no gallops, no JVD  Respiratory: No wheezing, scattered rhonchi, decreased breath sounds on her left lung field (completely).   Abdomen: Soft, mild tenderness left lower quadrant; positive bowel sounds; no guarding and no distention.  Musculoskeletal: No edema, no cyanosis  Data Reviewed: Basic Metabolic Panel:  Recent Labs Lab 02/09/2015 2239 03/01/15 0510 03/02/15 0500  NA 129* 132* 135  K 3.5 3.2* 3.5  CL 94* 93* 95*  CO2 25 32 33*  GLUCOSE 132* 105* 131*  BUN '15 11 8  '$ CREATININE 0.60 0.40* 0.31*  CALCIUM 9.9 8.5* 8.8*  MG  --   --  2.1   Liver Function Tests:  Recent Labs Lab 02/14/2015 2239  AST 29  ALT 17  ALKPHOS 105  BILITOT 0.6  PROT 5.6*  ALBUMIN 2.4*    Recent Labs Lab 02/25/2015 2239  LIPASE 16   CBC:  Recent Labs Lab 02/27/2015 1500 03/01/2015 2239 02/28/15 1049 03/01/15 1035 03/02/15 0500  WBC 15.0* 12.9* 8.8  --  9.0  NEUTROABS 13.7* 10.7* 6.9  --   --   HGB 12.8 11.6* 9.0* 10.0* 9.3*  HCT 38.5 34.5* 26.8* 30.7* 28.1*  MCV 100.5* 99.7 99.6  --  101.8*  PLT 284 230 185  --  180   Cardiac Enzymes:  Recent Labs Lab 02/21/2015 2341  TROPONINI <0.03   BNP (last 3 results)  Recent Labs  02/06/15 1918  BNP 61.0   CBG:  Recent Labs Lab 02/27/15 0829 02/28/15 0803 03/01/15 0854 03/01/15 1133 03/02/15 0735  GLUCAP 111* 95 171* 236* 124*    Recent Results (from the past 240 hour(s))  Blood Culture (routine x 2)     Status: None (Preliminary result)   Collection Time:  02/21/2015 11:41 PM  Result Value Ref Range Status   Specimen Description BLOOD RIGHT ANTECUBITAL  Final   Special Requests   Final    BOTTLES DRAWN AEROBIC AND ANAEROBIC AEB=6CC ANA=5CC   Culture NO GROWTH 2 DAYS  Final   Report Status PENDING  Incomplete  Blood Culture (routine x 2)     Status: None (Preliminary result)   Collection Time: 02/25/2015 11:59 PM  Result Value Ref Range Status   Specimen Description BLOOD PORTA CATH DRAWN BY RN RH  Final   Special Requests BOTTLES DRAWN AEROBIC AND ANAEROBIC 10CC EACH  Final   Culture NO GROWTH 2 DAYS  Final   Report Status PENDING  Incomplete  Urine culture     Status: None   Collection Time: 02/27/15  1:25 AM  Result Value Ref Range Status   Specimen Description URINE, CLEAN CATCH  Final   Special Requests NONE  Final   Culture   Final    MULTIPLE SPECIES PRESENT, SUGGEST RECOLLECTION Performed at [facility]    Report Status 02/28/2015 FINAL  Final  C difficile quick scan w PCR reflex     Status: Abnormal   Collection Time: 02/27/15 11:01 AM  Result Value Ref Range Status   C Diff antigen POSITIVE (A) NEGATIVE Final   C Diff toxin NEGATIVE NEGATIVE Final   C Diff  interpretation   Final    C. difficile present, but toxin not detected. This indicates colonization. In most cases, this does not require treatment. If patient has signs and symptoms consistent with colitis, consider treatment. Requires ENTERIC precautions.  Culture, body fluid-bottle     Status: None (Preliminary result)   Collection Time: 02/27/15  1:32 PM  Result Value Ref Range Status   Specimen Description FLUID LEFT PLEURAL  Final   Special Requests BOTTLES DRAWN AEROBIC AND ANAEROBIC 10CC EACH  Final   Gram Stain   Final    FEW WBC PRESENT,BOTH PMN AND MONONUCLEAR NO ORGANISMS SEEN    Culture NO GROWTH 2 DAYS  Final   Report Status PENDING  Incomplete  Gram stain     Status: None (Preliminary result)   Collection Time: 02/27/15  1:32 PM  Result Value  Ref Range Status   Specimen Description FLUID LEFT PLEURAL  Final   Special Requests NONE  Final   Gram Stain PENDING  Incomplete   Report Status PENDING  Incomplete     Studies: Dg Chest 1 View  03/02/2015  CLINICAL DATA:  Shortness of breath EXAM: CHEST 1 VIEW COMPARISON:  February 27, 2015 FINDINGS: The left lung is almost completely opacified with minimal aerated lung in the left apex. Decreasing lung volumes also seen on the right. A right Port-A-Cath is stable. No other interval changes. IMPRESSION: Near complete opacification of the left hemithorax, worsened in the interval. Decreasing lung volumes on the right. The cardiomediastinal silhouette is shifted to the right. Electronically Signed   By: Dorise Bullion III M.D   On: 03/02/2015 08:42    Scheduled Meds: . budesonide (PULMICORT) nebulizer solution  0.25 mg Nebulization BID  . feeding supplement (ENSURE ENLIVE)  237 mL Oral BID BM  . levalbuterol  0.63 mg Nebulization BID  . metoprolol  2.5 mg Intravenous Q12H  . 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 If 7PM-7AM, please contact night-coverage at [email or web address], password [facility] 02/25/2015, 12:24 PM  LOS: 4 days

## 2015-03-03 NOTE — Progress Notes (Signed)
PT IS PULSELESS. NO RESPIRATIONS.. NO BP. PT PRONOUNCED EXPIRED. FAMILY AT BEDSIDE. MUCH EMOTIONAL SUPPORT GIVEN TO FAMILY. [place] DONOR CONTACTED. PT IS NOT A CANDIDATE FOR DONATIONS.FAMILY REQUESTING [place] AND TRANSPORT REMAIN FROM ICU ROOM.

## 2015-03-03 NOTE — Progress Notes (Signed)
Patient resting comfortably on morphine. Diarrhea has ceased. Spent some time with family. Will continue to follow peripherally. Please call if any GI issues arise

## 2015-03-04 ENCOUNTER — Ambulatory Visit (HOSPITAL_COMMUNITY): Admission: RE | Admit: 2015-03-04 | Payer: Medicare Other | Source: Ambulatory Visit

## 2015-03-04 LAB — CULTURE, BODY FLUID-BOTTLE

## 2015-03-04 LAB — CULTURE, BLOOD (ROUTINE X 2)
CULTURE: NO GROWTH
Culture: NO GROWTH

## 2015-03-04 LAB — CULTURE, BODY FLUID W GRAM STAIN -BOTTLE: Culture: NO GROWTH

## 2015-03-04 LAB — GRAM STAIN

## 2015-03-06 NOTE — Progress Notes (Signed)
[facility] HERE TO PICK UP PT'S REMAINS.

## 2015-03-06 NOTE — Discharge Summary (Addendum)
Death Summary  JANITA CAMBEROS RCV:[id number] DOB: 1945/08/31 DOA: March 27, 2015  PCP: [name], MD PCP/Office notified: PCP notified by Epic   Admit date: Mar 27, 2015 Date of Death: 04-02-15  Final Diagnoses:  Principal Problem:   Colitis Active Problems:   Endometrial ca [facility])   Malignant neoplasm metastatic to lung ([place])   SOB (shortness of breath)   Pleural effusion, left   Anemia due to other cause   Sepsis ([facility])   Pleural effusion on left   Status post thoracentesis   Acute on chronic respiratory failure with hypoxia ([facility])   SIRS (systemic inflammatory response syndrome) ([facility]CC)   Fecal occult blood test positive hypokalemia   History of present illness:  As per Dr. Myna Hidalgo H&P on March 27, 2015 70 y.o. 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 Patient is followed in the outpatient setting by Dr. Whitney Muse of oncology and Dr. Roxan Hockey of CT surgery. She has a large left-sided lung mass with associated effusion that is status post biopsy. She has experienced significant shortness of breath with this and had been recommended for outpatient thoracentesis, but this had not yet been arranged. At approximately 3 PM on the day of her presentation, patient developed some generalized abdominal pain and diarrhea. Per report, the diarrhea had been black and the patient was having repeated episodes with very little rest in between. There was concern for blood loss in this patient who had just required blood transfusion last week, and so she was brought into the ED by her family. She apparently woke in in her usual state but developed the fever and GI distress in the afternoon. She reportedly took Tylenol just prior to her presentation.  In ED, patient was found to be afebrile, saturating well on room air, but  tachypnea with rate in the mid to high 20s and tachycardic to 130. Blood pressure was in the 110/55 range. Chest x-ray demonstrated a near complete opacification of the left hemithorax and this was followed up with CT of the abdomen pelvis and chest with contrast. CT demonstrates rapid interval enlargement of left lung mass and associated effusion. Also demonstrated is wall thickening in the colon suggestive of colitis. DRE produced dark stool that was weakly FOBT positive. CBC is notable for a leukocytosis to 13,000 and hemoglobin of 11.6. Chem panel features a hyponatremia to 129 and serum albumin of 2.4. Urinalysis was unremarkable. Lactic acid returned reassuring at a value of 1.6 and troponin was undetectable. After blood and urine cultures have been obtained, patient was treated empirically with vancomycin and Zosyn. She was bolused with 2 L of normal saline and continued on IV fluids at a rate of 125 cc/hr. C. difficile assay was ordered but the patient had no further diarrhea after arriving in the ED. Blood pressure remained stable and tachycardia improved with fluids. Patient was placed on contact isolation and admitted to the hospital for ongoing evaluation and management of acute onset dark diarrhea with CT features suggestive of colitis, as well as dyspnea in the setting of a rapidly enlarging left lung mass with effusion.  Hospital Course:  1-C. difficile colitis: Patient with left lower quadrant pain, nausea, vomiting, diarrhea and CT scan demonstrating colonic inflammation.  -Significant improvement in patient's GI symptoms. Given further decline and patient inability to tolerate well oral regimen. Discussed with family members  and all oral meds were stopped. Care was transition to full comfort/symptomatic management. -Overall poor prognosis expressed to family and plans were to move patient to a hospice home if stable, but hospital death was also a possibility. Patient expired on 2/26 at  11:26pm -family grateful of care and attentions   2-acute on chronic respiratory failure with hypoxia: Appears to be secondary to malignant pleural effusion and post obstructive pneumonia -Patient was status post thoracentesis; but with repeated x-ray demonstrating complete opacification of the left lung field and re accumulation. After discussing with oncology service and family; patient's active declining and no availability of further treatment; she was transition to full comfort care and symptoms management  -patient expired on 02/07/2015 at 11:26pm  3-sirs/sepsis: secondary to C. difficile colitis versus post obstructive pneumonia  -plan of care was transitioned to full comfort care -antibiotics discontinued -ativan and morphine initiated -patient expired on 03/02/2015 at 11:26pm   4-black stools and positive fecal occult blood tests: Most likely secondary to ongoing colitis -GI has been consulted but that is no plan for any endoscopic procedures currently -Was treated with IV pepcid, and no further overt bleeding appreciated -patient care given lung metastasis and overall declined was transition to full comfort/symptomatic management.  -patient was allow to have comfort feeding  5-stage IV endometrial cancer; with presumed metastatic lung lesions -Per oncology service, plan is for hospice and symptomatic management at this point. -Palliative care consult was in place -patient initiated on ativan, morphine and end of life order sets medications for comfort -Family updated and all questions and answered   6-moderate protein calorie malnutrition: patient kept on PRN feeding supplement and comfort feeding. Appetite was poor and barely eating at the end.  7-hypokalemia:  -secondary to GI loses     Time: 25 minutes  Signed:  Barton Dubois  [facility] 03-23-2015, 8:38 AM

## 2015-03-06 DEATH — deceased

## 2015-03-09 LAB — FUNGUS CULTURE W SMEAR: Fungal Smear: NONE SEEN

## 2015-03-12 ENCOUNTER — Encounter (HOSPITAL_COMMUNITY): Payer: Medicare Other

## 2015-03-25 LAB — AFB CULTURE WITH SMEAR (NOT AT ARMC): ACID FAST SMEAR: NONE SEEN

## 2015-06-04 ENCOUNTER — Other Ambulatory Visit (HOSPITAL_COMMUNITY): Payer: Self-pay | Admitting: Hematology & Oncology

## 2017-06-29 IMAGING — CT NM PET TUM IMG RESTAG (PS) SKULL BASE T - THIGH
9 of 10 series · 19 of 25 positions shown · non-contrast
Comparison: 12/06/2014 PET-CT.

CLINICAL DATA: Subsequent treatment strategy for endometrial
carcinoma diagnosed in November 2012, status post TAHBSO on
12/09/2012, with lung metastases detected in July 2014, status post
chemotherapy, with progression of left upper lobe pulmonary
metastasis described on prior PET-CT. Remote history of left breast
cancer.

EXAM:
NUCLEAR MEDICINE PET SKULL BASE TO THIGH
TECHNIQUE: 12.3 mCi F-18 FDG was injected intravenously. Full-ring PET imaging
was performed from the skull base to thigh after the radiotracer. CT
data was obtained and used for attenuation correction and anatomic
localization.
FASTING BLOOD GLUCOSE:  Value: 81 mg/dl

[Series 3: ct wb 5.0 b30f · axial · 5.0mm · 0.98mm/px · z∈[-1345,-970]mm · 2 of 251 slices shown]
[im 1/251]
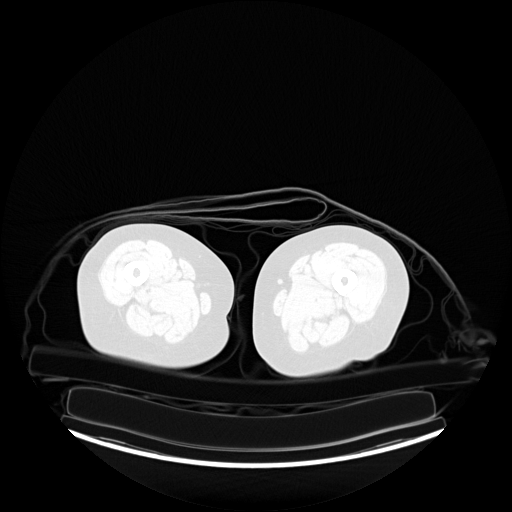
[im 126/251]
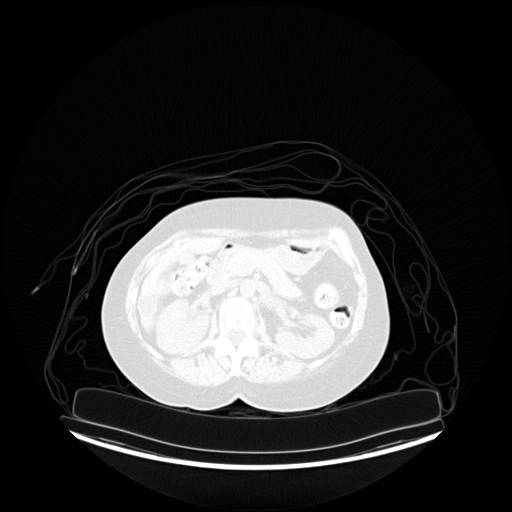

[Series 4: pet wb (ac) · axial · 5.0mm · 4.07mm/px · z∈[-1345,-595]mm · 3 of 251 slices shown]
[im 1/251]
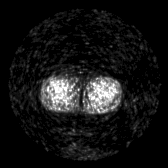
[im 126/251]
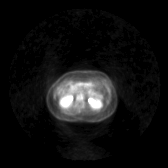
[im 251/251]
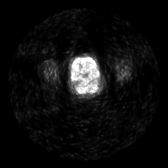

[Series 5: pet wb uncorrected (nac) · axial · 5.0mm · 4.07mm/px · z∈[-1096,-595]mm · 3 of 251 slices shown]
[im 84/251]
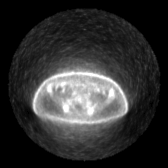
[im 167/251]
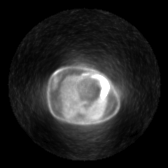
[im 251/251]
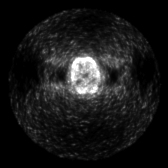

[Series 603: pet axial · 3 of 233 slices shown]
[im 78/233]
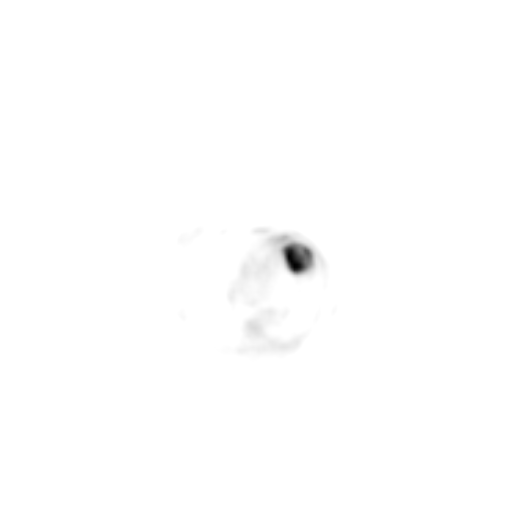
[im 155/233]
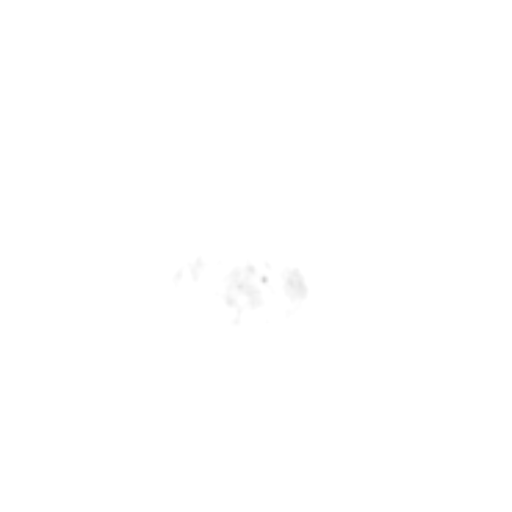
[im 233/233]
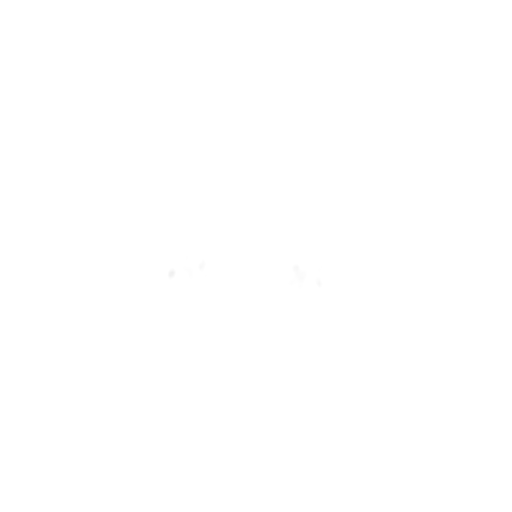

[Series 605: pet sagittal · 2 of 113 slices shown]
[im 1/113]
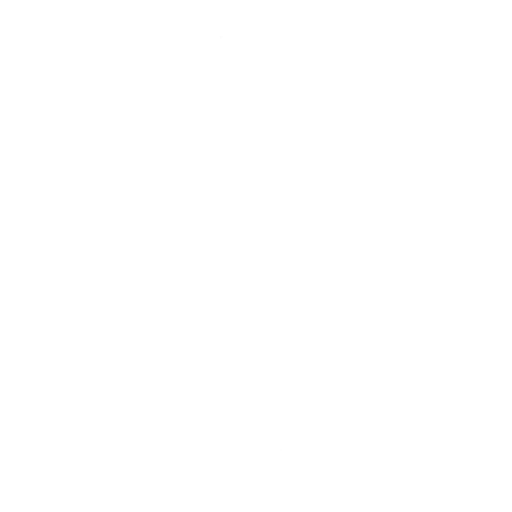
[im 113/113]
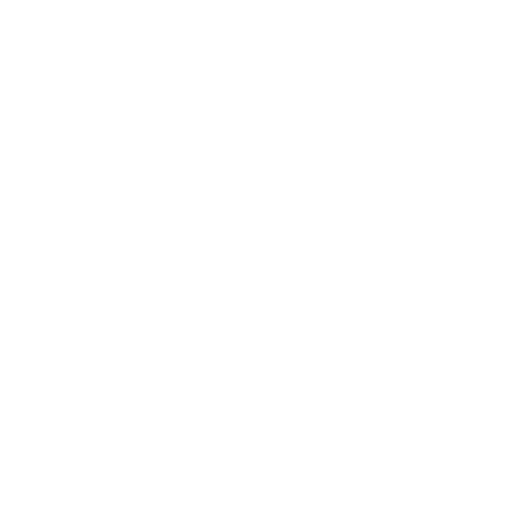

[Series 606: pet ct axial · 3 of 230 slices shown]
[im 1/230]
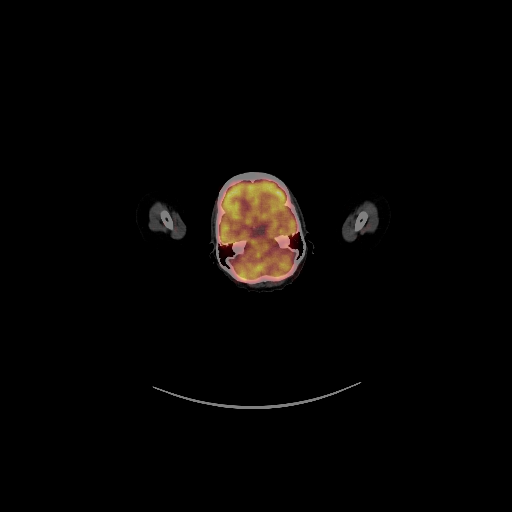
[im 153/230]
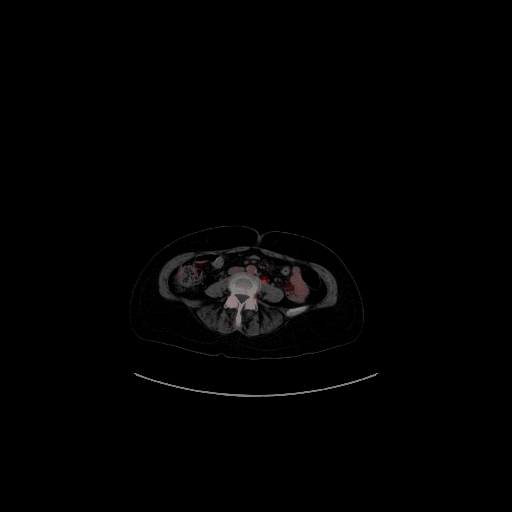
[im 230/230]
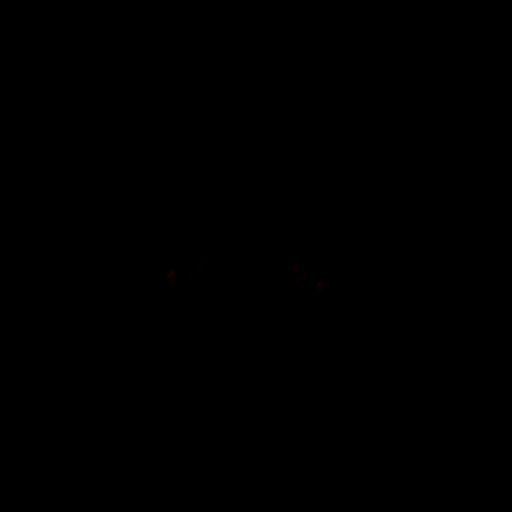

[Series 607: pet ct coronal · 1 of 93 slices shown]
[im 1/93]
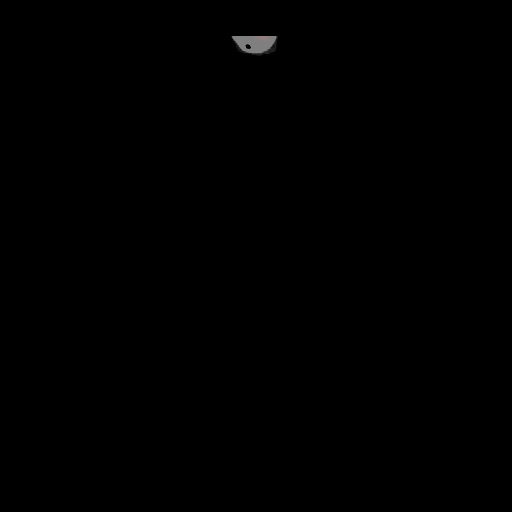

[Series 608: pet ct sagittal · 1 of 102 slices shown]
[im 102/102]
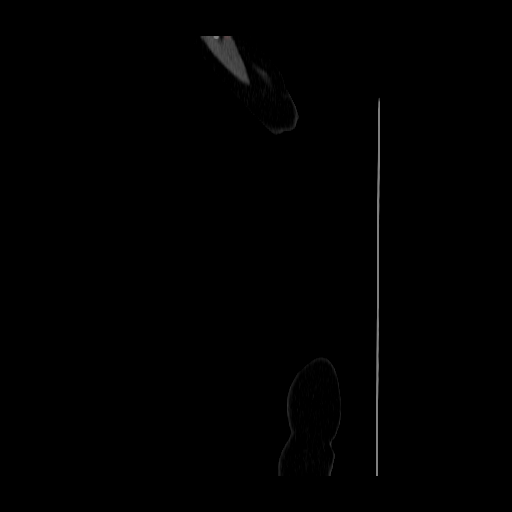

[Series 1439: results mm oncology reading · 0.41mm/px · 1 of 5 slices shown]
[im 1/5]
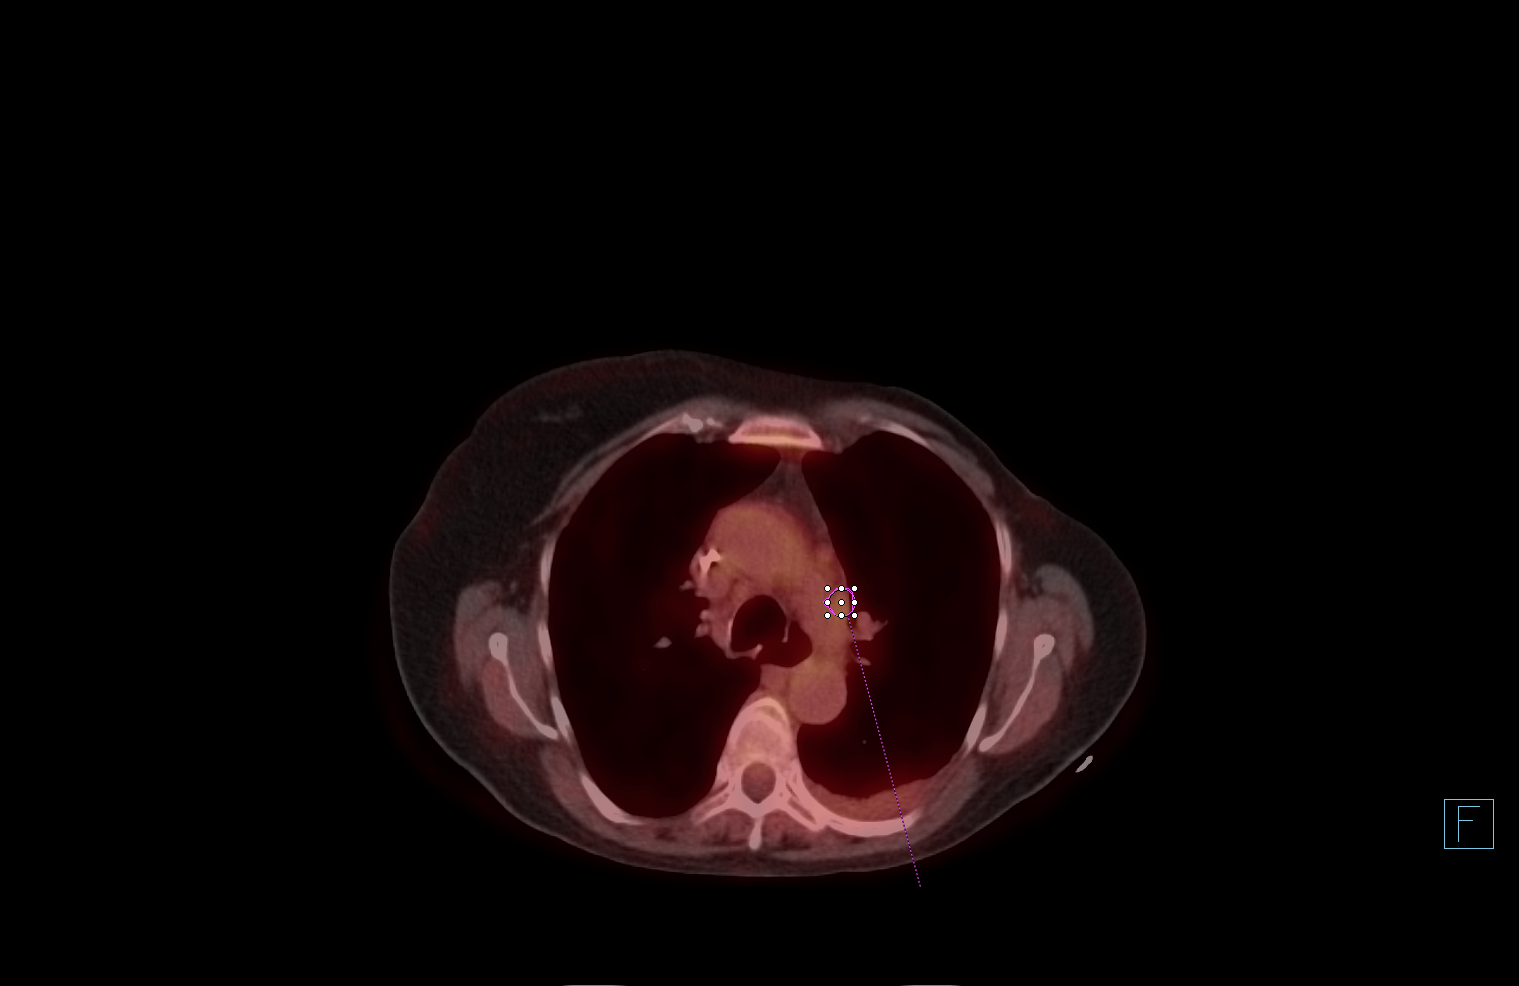

[19 of 25 positions shown; findings below may reference images not displayed]

FINDINGS: NECK

No hypermetabolic lymph nodes in the neck.

CHEST

There is an irregular hypermetabolic anterior medial left upper lobe
lung mass (series 3/image 81) with max SUV 15.0, which has
significantly increased in size and metabolism, previously 3.6 x
cm with max SUV 9.0. There is new hypermetabolism with associated
pleural thickening within the anterior left pleural space with max
SUV 4.6, with associated small layering left pleural effusion, in
keeping with a malignant left pleural effusion.

There are 3 subcentimeter right upper lobe pulmonary nodules
measuring up to 0.8 cm in size (series 3/image 60), which are
unchanged in size and demonstrate no significant metabolism.

There are newly mildly hypermetabolic subcentimeter left mediastinal
lymph nodes, including a 0.7 cm AP window node (series 3/image 65)
with max SUV 2.6, a 0.7 cm prevascular node (series 3/image 65) with
max SUV 2.3 and a 0.7 cm upper left paratracheal node (series
3/image 53) with max SUV 2.3.

No hypermetabolic axillary nodes. Left axillary nodal dissection
clips are again noted. Stable appearance of the chest wall status
post left mastectomy. No hypermetabolic superficial chest wall
masses. Right internal jugular MediPort terminates at the cavoatrial
junction.

ABDOMEN/PELVIS

No abnormal hypermetabolic activity within the liver, pancreas,
adrenal glands, or spleen. No hypermetabolic lymph nodes in the
abdomen or pelvis. Stable non hypermetabolic 2.2 cm inferior right
liver lobe cyst. Hysterectomy.

SKELETON

No focal hypermetabolic activity to suggest skeletal metastasis.
Previously described reactive marrow uptake has resolved.
IMPRESSION: 1. Significant interval progression of hypermetabolic left upper
lobe lung mass.
2. New small malignant left pleural effusion with associated left
pleural thickening and hypermetabolism anteriorly adjacent to the
progressive left upper lobe lung mass.
3. Newly mildly hypermetabolic left mediastinal lymphadenopathy,
likely representing mediastinal nodal metastases.
4. Right pulmonary nodules are stable and non hypermetabolic.
5. No hypermetabolic metastatic disease in the neck, abdomen or
pelvis. No hypermetabolic bone metastases.

## 2017-07-03 IMAGING — CT CT BIOPSY
1 of 2 series · 14 of 32 positions shown, 19 images · non-contrast
Comparison: none

ADDENDUM:
Nursing monitored the patient during sedation per protocol.
CLINICAL DATA: Left upper lobe lung mass

[Series 2: i-spiral 5.0 b40f · axial · 0.86mm/px · z∈[+1392,+1458]mm · 14 of 23 slices shown, 19 images]
[im 2/23  soft-tissue]
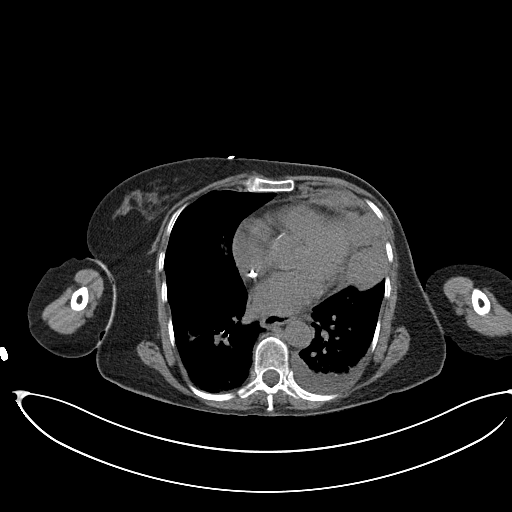
[im 2/23  bone]
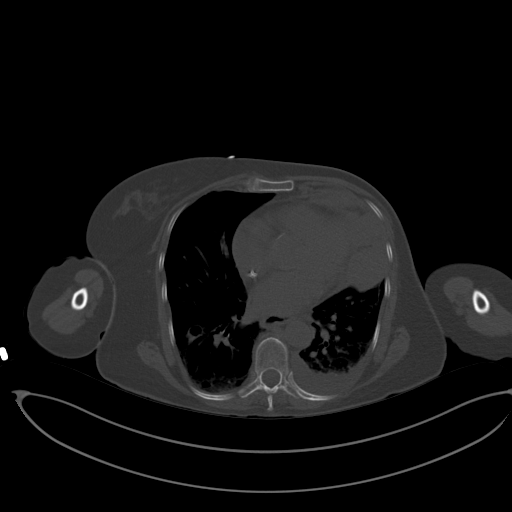
[im 3/23  soft-tissue]
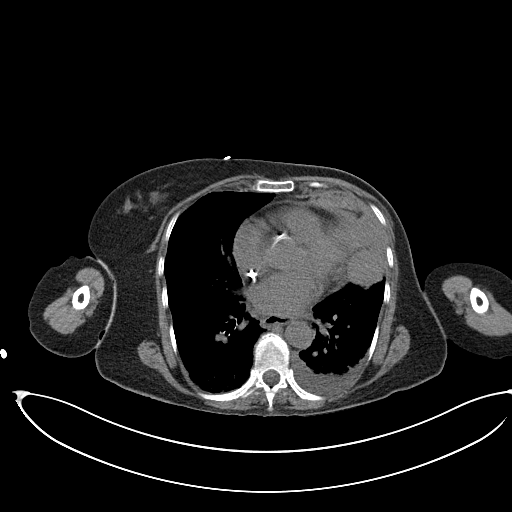
[im 6/23  soft-tissue]
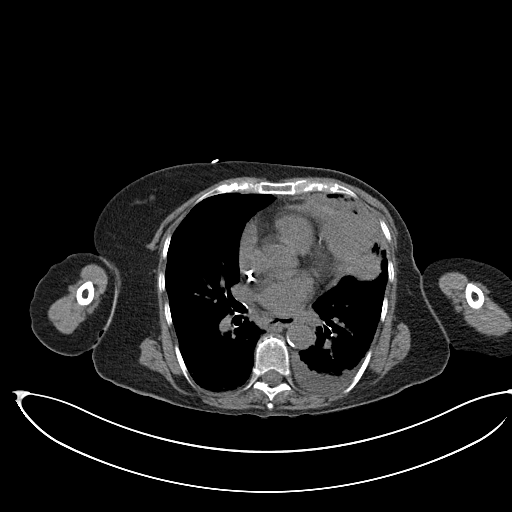
[im 7/23  soft-tissue]
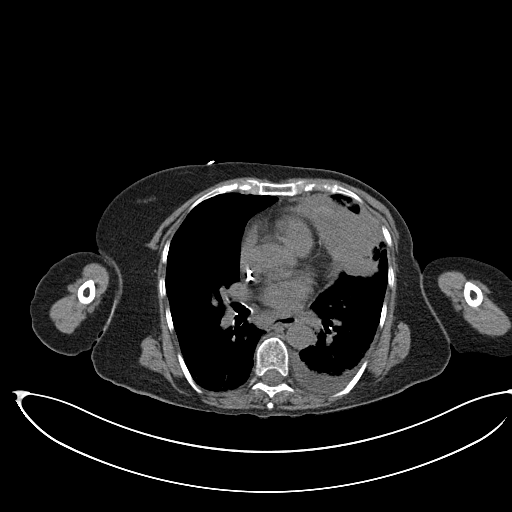
[im 8/23  soft-tissue]
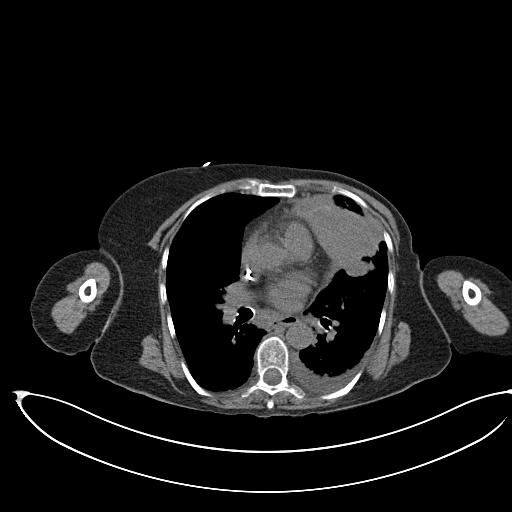
[im 10/23  soft-tissue]
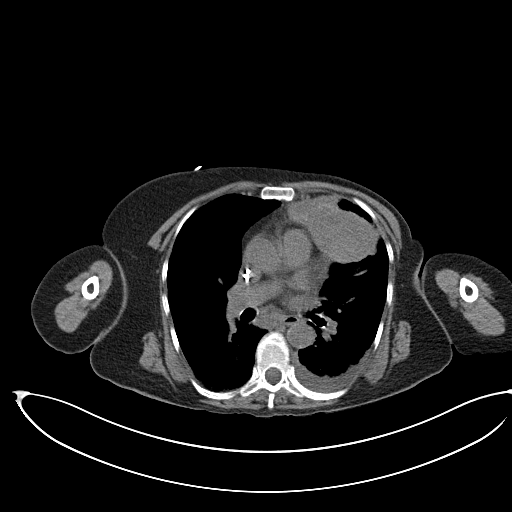
[im 12/23  soft-tissue]
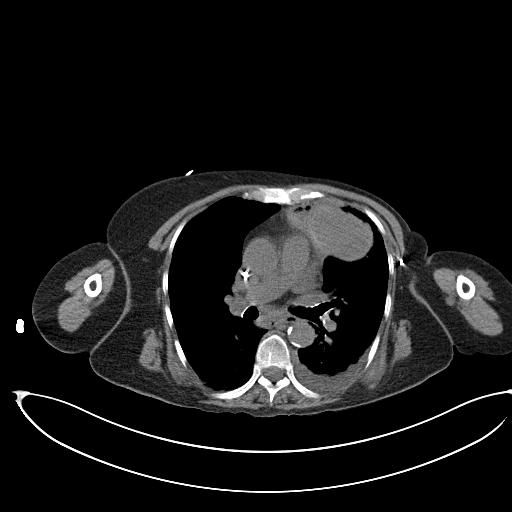
[im 13/23  soft-tissue]
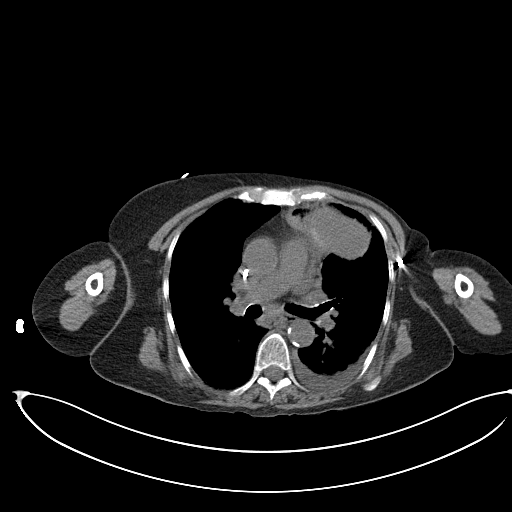
[im 15/23  soft-tissue]
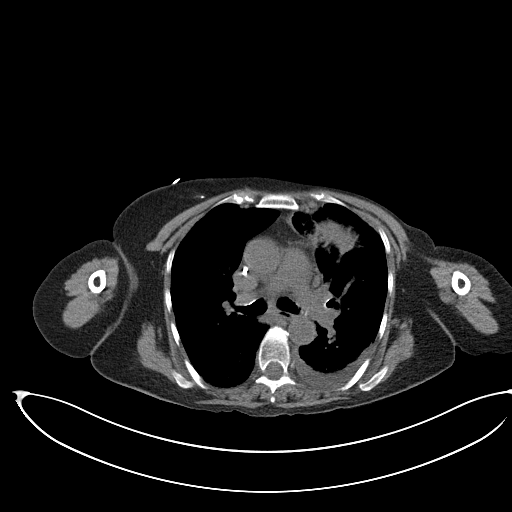
[im 15/23  bone]
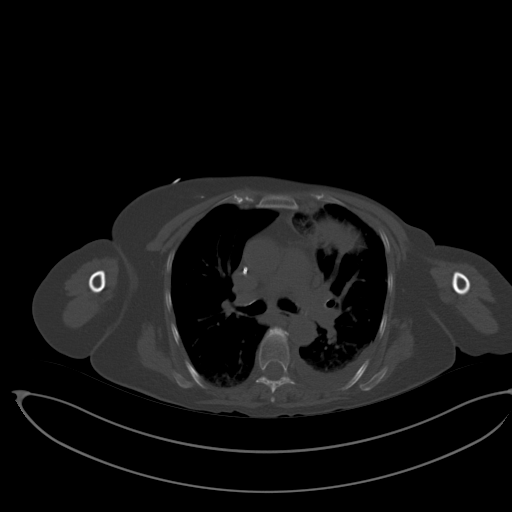
[im 16/23  soft-tissue]
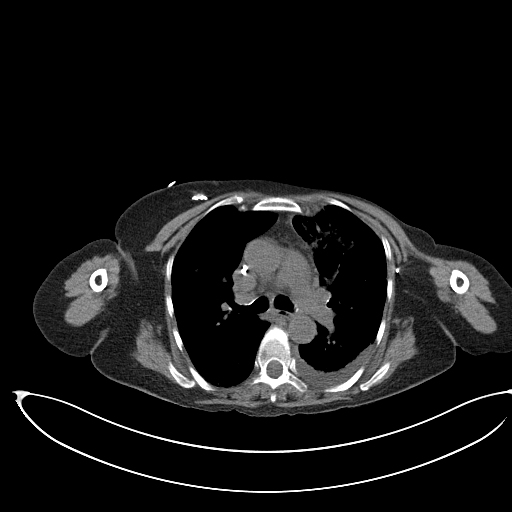
[im 17/23  soft-tissue]
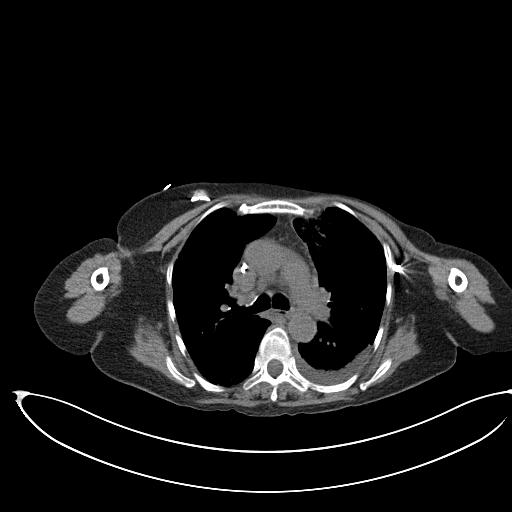
[im 17/23  lung]
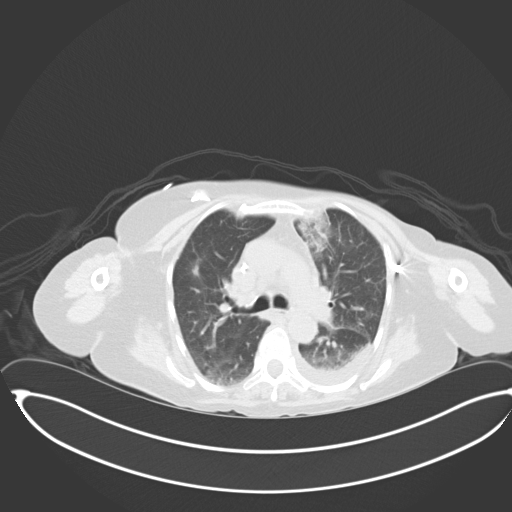
[im 19/23  lung]
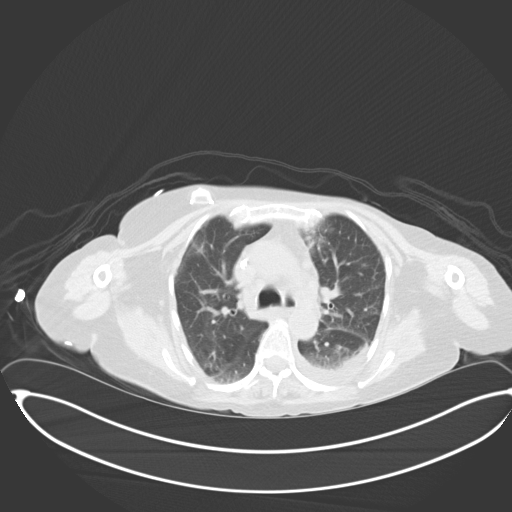
[im 20/23  soft-tissue]
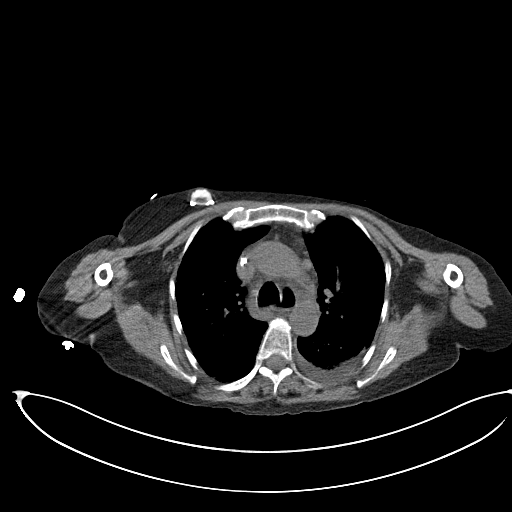
[im 20/23  lung]
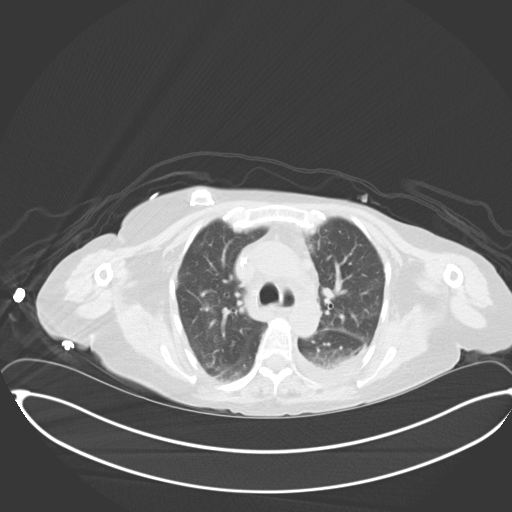
[im 21/23  soft-tissue]
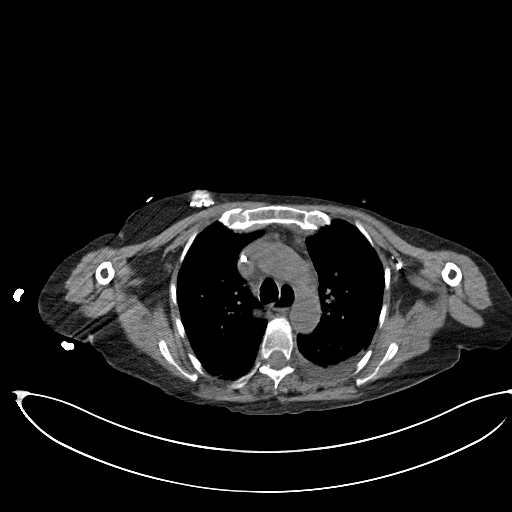
[im 21/23  lung]
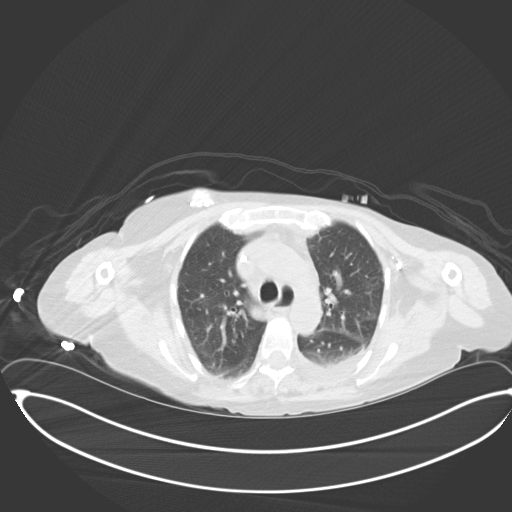

[14 of 32 positions shown; findings below may reference images not displayed]

EXAM:
CT-GUIDED BIOPSY LEFT UPPER LOBE LUNG MASS.  CORE.

MEDICATIONS AND MEDICAL HISTORY:
Versed 1 mg, Fentanyl 50 mcg.

Additional Medications: None.

ANESTHESIA/SEDATION:
Moderate sedation time: 10 minutes

PROCEDURE:
The procedure, risks, benefits, and alternatives were explained to
the patient. Questions regarding the procedure were encouraged and
answered. The patient understands and consents to the procedure.

The anterior thorax was prepped with ChloraPrep in a sterile
fashion, and a sterile drape was applied covering the operative
field. A sterile gown and sterile gloves were used for the
procedure.

Under CT guidance, a(n) 17 gauge guide needle was advanced into the
left upper lobe lung mass. Subsequently four 18 gauge core biopsies
were obtained. The guide needle was removed. Final imaging was
performed.

Patient tolerated the procedure well without complication. Vital
sign monitoring by nursing staff during the procedure will continue
as patient is in the special procedures unit for post procedure
observation.
FINDINGS: The images document guide needle placement within the left upper
lobe lung mass. Post biopsy images demonstrate no hemorrhage.

COMPLICATIONS:
None
IMPRESSION: Successful CT-guided left upper lobe lung mass core biopsy.
